# Patient Record
Sex: Female | Born: 1945
Health system: Southern US, Community
[De-identification: ages and names within clinical notes are randomized; demographics above are authoritative.]

## PROBLEM LIST (undated history)

## (undated) DIAGNOSIS — M1990 Unspecified osteoarthritis, unspecified site: Secondary | ICD-10-CM

## (undated) DIAGNOSIS — I2510 Atherosclerotic heart disease of native coronary artery without angina pectoris: Secondary | ICD-10-CM

## (undated) DIAGNOSIS — K219 Gastro-esophageal reflux disease without esophagitis: Secondary | ICD-10-CM

## (undated) DIAGNOSIS — D649 Anemia, unspecified: Secondary | ICD-10-CM

## (undated) DIAGNOSIS — R112 Nausea with vomiting, unspecified: Secondary | ICD-10-CM

## (undated) DIAGNOSIS — N189 Chronic kidney disease, unspecified: Secondary | ICD-10-CM

## (undated) DIAGNOSIS — C801 Malignant (primary) neoplasm, unspecified: Secondary | ICD-10-CM

## (undated) DIAGNOSIS — Z9889 Other specified postprocedural states: Secondary | ICD-10-CM

## (undated) DIAGNOSIS — I10 Essential (primary) hypertension: Secondary | ICD-10-CM

## (undated) DIAGNOSIS — R7303 Prediabetes: Secondary | ICD-10-CM

## (undated) DIAGNOSIS — F419 Anxiety disorder, unspecified: Secondary | ICD-10-CM

## (undated) HISTORY — DX: Unspecified osteoarthritis, unspecified site: M19.90

## (undated) HISTORY — PX: REVISION TOTAL HIP ARTHROPLASTY: SHX766

## (undated) HISTORY — DX: Anemia, unspecified: D64.9

## (undated) HISTORY — PX: ABDOMINAL HYSTERECTOMY: SHX81

## (undated) HISTORY — DX: Atherosclerotic heart disease of native coronary artery without angina pectoris: I25.10

## (undated) HISTORY — DX: Anxiety disorder, unspecified: F41.9

## (undated) HISTORY — DX: Gastro-esophageal reflux disease without esophagitis: K21.9

## (undated) HISTORY — PX: BACK SURGERY: SHX140

## (undated) HISTORY — PX: FOOT SURGERY: SHX648

---

## 1997-12-29 ENCOUNTER — Ambulatory Visit (HOSPITAL_COMMUNITY): Admission: RE | Admit: 1997-12-29 | Discharge: 1997-12-29 | Payer: Self-pay | Admitting: Neurosurgery

## 1997-12-29 ENCOUNTER — Encounter: Payer: Self-pay | Admitting: Neurosurgery

## 1998-01-15 ENCOUNTER — Ambulatory Visit (HOSPITAL_COMMUNITY): Admission: RE | Admit: 1998-01-15 | Discharge: 1998-01-15 | Payer: Self-pay | Admitting: Neurosurgery

## 1998-01-15 ENCOUNTER — Encounter: Payer: Self-pay | Admitting: Neurosurgery

## 1998-02-04 ENCOUNTER — Encounter: Payer: Self-pay | Admitting: Neurosurgery

## 1998-02-04 ENCOUNTER — Ambulatory Visit (HOSPITAL_COMMUNITY): Admission: RE | Admit: 1998-02-04 | Discharge: 1998-02-04 | Payer: Self-pay | Admitting: Neurosurgery

## 1998-02-18 ENCOUNTER — Ambulatory Visit (HOSPITAL_COMMUNITY): Admission: RE | Admit: 1998-02-18 | Discharge: 1998-02-18 | Payer: Self-pay | Admitting: Neurosurgery

## 1998-02-18 ENCOUNTER — Encounter: Payer: Self-pay | Admitting: Neurosurgery

## 2000-04-28 ENCOUNTER — Ambulatory Visit (HOSPITAL_COMMUNITY): Admission: RE | Admit: 2000-04-28 | Discharge: 2000-04-28 | Payer: Self-pay | Admitting: Internal Medicine

## 2000-04-28 ENCOUNTER — Encounter: Payer: Self-pay | Admitting: Internal Medicine

## 2001-02-15 ENCOUNTER — Encounter (INDEPENDENT_AMBULATORY_CARE_PROVIDER_SITE_OTHER): Payer: Self-pay | Admitting: Internal Medicine

## 2001-02-15 ENCOUNTER — Ambulatory Visit (HOSPITAL_COMMUNITY): Admission: RE | Admit: 2001-02-15 | Discharge: 2001-02-15 | Payer: Self-pay | Admitting: Internal Medicine

## 2001-03-09 ENCOUNTER — Ambulatory Visit (HOSPITAL_COMMUNITY): Admission: RE | Admit: 2001-03-09 | Discharge: 2001-03-09 | Payer: Self-pay | Admitting: Internal Medicine

## 2001-03-09 ENCOUNTER — Encounter (INDEPENDENT_AMBULATORY_CARE_PROVIDER_SITE_OTHER): Payer: Self-pay | Admitting: Internal Medicine

## 2001-06-30 ENCOUNTER — Ambulatory Visit (HOSPITAL_COMMUNITY): Admission: RE | Admit: 2001-06-30 | Discharge: 2001-06-30 | Payer: Self-pay | Admitting: Cardiology

## 2001-10-21 ENCOUNTER — Ambulatory Visit (HOSPITAL_COMMUNITY): Admission: RE | Admit: 2001-10-21 | Discharge: 2001-10-21 | Payer: Self-pay | Admitting: Internal Medicine

## 2001-10-21 ENCOUNTER — Encounter: Payer: Self-pay | Admitting: Internal Medicine

## 2001-11-10 ENCOUNTER — Ambulatory Visit (HOSPITAL_COMMUNITY): Admission: RE | Admit: 2001-11-10 | Discharge: 2001-11-10 | Payer: Self-pay | Admitting: Internal Medicine

## 2001-11-10 ENCOUNTER — Encounter (INDEPENDENT_AMBULATORY_CARE_PROVIDER_SITE_OTHER): Payer: Self-pay | Admitting: Internal Medicine

## 2001-12-23 ENCOUNTER — Ambulatory Visit (HOSPITAL_COMMUNITY): Admission: RE | Admit: 2001-12-23 | Discharge: 2001-12-23 | Payer: Self-pay | Admitting: Internal Medicine

## 2002-06-28 ENCOUNTER — Ambulatory Visit (HOSPITAL_COMMUNITY): Admission: RE | Admit: 2002-06-28 | Discharge: 2002-06-28 | Payer: Self-pay | Admitting: Internal Medicine

## 2002-06-28 ENCOUNTER — Encounter (INDEPENDENT_AMBULATORY_CARE_PROVIDER_SITE_OTHER): Payer: Self-pay | Admitting: Internal Medicine

## 2002-11-10 ENCOUNTER — Ambulatory Visit (HOSPITAL_COMMUNITY): Admission: RE | Admit: 2002-11-10 | Discharge: 2002-11-10 | Payer: Self-pay | Admitting: Internal Medicine

## 2004-04-04 ENCOUNTER — Ambulatory Visit (HOSPITAL_COMMUNITY): Admission: RE | Admit: 2004-04-04 | Discharge: 2004-04-04 | Payer: Self-pay | Admitting: Internal Medicine

## 2004-04-16 ENCOUNTER — Ambulatory Visit (HOSPITAL_COMMUNITY): Admission: RE | Admit: 2004-04-16 | Discharge: 2004-04-16 | Payer: Self-pay | Admitting: Internal Medicine

## 2005-04-28 ENCOUNTER — Ambulatory Visit (HOSPITAL_COMMUNITY): Admission: RE | Admit: 2005-04-28 | Discharge: 2005-04-28 | Payer: Self-pay | Admitting: Internal Medicine

## 2006-02-25 ENCOUNTER — Emergency Department (HOSPITAL_COMMUNITY): Admission: EM | Admit: 2006-02-25 | Discharge: 2006-02-26 | Payer: Self-pay | Admitting: Emergency Medicine

## 2006-04-30 ENCOUNTER — Ambulatory Visit (HOSPITAL_COMMUNITY): Admission: RE | Admit: 2006-04-30 | Discharge: 2006-04-30 | Payer: Self-pay | Admitting: Internal Medicine

## 2006-05-05 ENCOUNTER — Encounter: Admission: RE | Admit: 2006-05-05 | Discharge: 2006-05-05 | Payer: Self-pay | Admitting: Endocrinology

## 2007-05-06 ENCOUNTER — Ambulatory Visit (HOSPITAL_COMMUNITY): Admission: RE | Admit: 2007-05-06 | Discharge: 2007-05-06 | Payer: Self-pay | Admitting: Internal Medicine

## 2008-05-14 ENCOUNTER — Ambulatory Visit (HOSPITAL_COMMUNITY): Admission: RE | Admit: 2008-05-14 | Discharge: 2008-05-14 | Payer: Self-pay | Admitting: Internal Medicine

## 2009-05-16 ENCOUNTER — Ambulatory Visit (HOSPITAL_COMMUNITY): Admission: RE | Admit: 2009-05-16 | Discharge: 2009-05-16 | Payer: Self-pay | Admitting: Internal Medicine

## 2009-11-04 ENCOUNTER — Emergency Department (HOSPITAL_COMMUNITY): Admission: EM | Admit: 2009-11-04 | Discharge: 2009-11-04 | Payer: Self-pay | Admitting: Emergency Medicine

## 2009-11-05 ENCOUNTER — Emergency Department (HOSPITAL_COMMUNITY): Admission: EM | Admit: 2009-11-05 | Discharge: 2009-11-05 | Payer: Self-pay | Admitting: Emergency Medicine

## 2010-02-01 ENCOUNTER — Encounter: Payer: Self-pay | Admitting: Internal Medicine

## 2010-03-13 ENCOUNTER — Ambulatory Visit (INDEPENDENT_AMBULATORY_CARE_PROVIDER_SITE_OTHER): Payer: BC Managed Care – PPO | Admitting: Internal Medicine

## 2010-03-13 DIAGNOSIS — R1310 Dysphagia, unspecified: Secondary | ICD-10-CM

## 2010-03-26 LAB — DIFFERENTIAL
Basophils Absolute: 0 10*3/uL (ref 0.0–0.1)
Basophils Relative: 0 % (ref 0–1)
Eosinophils Absolute: 0.1 10*3/uL (ref 0.0–0.7)
Eosinophils Relative: 1 % (ref 0–5)
Lymphocytes Relative: 17 % (ref 12–46)
Lymphs Abs: 1.1 10*3/uL (ref 0.7–4.0)
Monocytes Absolute: 0.6 10*3/uL (ref 0.1–1.0)
Monocytes Relative: 8 % (ref 3–12)
Neutro Abs: 5 10*3/uL (ref 1.7–7.7)
Neutrophils Relative %: 74 % (ref 43–77)

## 2010-03-26 LAB — CBC
HCT: 40.9 % (ref 36.0–46.0)
Hemoglobin: 13.8 g/dL (ref 12.0–15.0)
MCH: 29.2 pg (ref 26.0–34.0)
MCHC: 33.7 g/dL (ref 30.0–36.0)
MCV: 86.5 fL (ref 78.0–100.0)
Platelets: 275 10*3/uL (ref 150–400)
RBC: 4.73 MIL/uL (ref 3.87–5.11)
RDW: 12.8 % (ref 11.5–15.5)
WBC: 6.8 10*3/uL (ref 4.0–10.5)

## 2010-03-26 LAB — POCT I-STAT, CHEM 8
BUN: 12 mg/dL (ref 6–23)
Calcium, Ion: 1.25 mmol/L (ref 1.12–1.32)
Chloride: 101 mEq/L (ref 96–112)
Creatinine, Ser: 1.1 mg/dL (ref 0.4–1.2)
Glucose, Bld: 99 mg/dL (ref 70–99)
HCT: 43 % (ref 36.0–46.0)
Hemoglobin: 14.6 g/dL (ref 12.0–15.0)
Potassium: 4 mEq/L (ref 3.5–5.1)
Sodium: 139 mEq/L (ref 135–145)
TCO2: 31 mmol/L (ref 0–100)

## 2010-03-26 LAB — POCT CARDIAC MARKERS
CKMB, poc: <1 ng/mL — ABNORMAL LOW (ref 1.0–8.0)
Myoglobin, poc: 69.7 ng/mL (ref 12–200)
Troponin i, poc: <0.05 ng/mL (ref 0.00–0.09)

## 2010-04-02 ENCOUNTER — Encounter (HOSPITAL_BASED_OUTPATIENT_CLINIC_OR_DEPARTMENT_OTHER): Payer: BC Managed Care – PPO | Admitting: Internal Medicine

## 2010-04-02 ENCOUNTER — Ambulatory Visit (HOSPITAL_COMMUNITY)
Admission: RE | Admit: 2010-04-02 | Discharge: 2010-04-02 | Disposition: A | Discharge status: Home / Self Care | Payer: BC Managed Care – PPO | Source: Ambulatory Visit | Attending: Internal Medicine | Admitting: Internal Medicine

## 2010-04-02 DIAGNOSIS — R1310 Dysphagia, unspecified: Secondary | ICD-10-CM

## 2010-04-02 DIAGNOSIS — E785 Hyperlipidemia, unspecified: Secondary | ICD-10-CM | POA: Insufficient documentation

## 2010-04-02 DIAGNOSIS — K449 Diaphragmatic hernia without obstruction or gangrene: Secondary | ICD-10-CM | POA: Insufficient documentation

## 2010-04-02 DIAGNOSIS — K219 Gastro-esophageal reflux disease without esophagitis: Secondary | ICD-10-CM

## 2010-04-02 DIAGNOSIS — Z7982 Long term (current) use of aspirin: Secondary | ICD-10-CM | POA: Insufficient documentation

## 2010-04-02 DIAGNOSIS — K222 Esophageal obstruction: Secondary | ICD-10-CM

## 2010-04-15 NOTE — Consult Note (Signed)
Laurie Bridges, Laurie Bridges           ACCOUNT NO.:  [id number]  MEDICAL RECORD NO.:  [id number]           PATIENT TYPE:  LOCATION: Office                              FACILITY:  PHYSICIAN:  Laurie Bridges, M.D.    DATE OF BIRTH:  09/10/1945  DATE OF CONSULTATION: 03/13/2010 DATE OF DISCHARGE:                                CONSULTATION   REASON FOR CONSULTATION:  This is an office visit for dysphagia.  HISTORY OF PRESENT ILLNESS:  Laurie Bridges is a 65 year old female referred to our office by Dr. Ouida Bridges for dysphagia.  Laurie Bridges states she is having frequent acid reflux.  She is also complaining of having spasms in her esophagus.  She states foods feel like they are lodging in her upper esophagus and are slowly going down and lodging at the end of her esophagus.  She states she has had these symptoms for about 3 months. They are somewhat better since being on the Zegerid for 6 months.  She also complains of her throat being dry.  She did undergo an EGD/ED  in 1998 for dysphagia.  I will try to locate that record.  She does state her acid reflux is somewhat better.  At night, she states foods feel like they are bubbling back up in her esophagus.  Her appetite has remained good.  She has had no weight loss.  There has been no nausea, vomiting.  She usually has a bowel movement 1 every couple of days.  She denies melena or rectal bleeding.  MEDICAL HISTORY:  Includes high cholesterol, back surgery x2.  She has had a partial hysterectomy, right bunionectomy, hyperthyroidism.  She did undergo colonoscopy in July 2011 for heme-positive stools which revealed external hemorrhoids, otherwise normal colonoscopy.  FAMILY HISTORY:  Her mother deceased from a CVA.  Her father has coronary artery disease.  One brother in good health.  SOCIAL HISTORY:  She is widowed.  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  She has 2 children in good health.  She does state one has early  COPD and there is no family history of colon cancer.  ALLERGIES:  She is allergic to [facility], PYRIDIUM, DEMEROL, and CODEINE.  MEDICATIONS: 1. Amitriptyline 50 mg a day. 2. Zegerid OTC 1 a day. 3. Lipitor 20 mg a day. 4. Reglan 5 mg a.c. and at bedtime. 5. Atenolol 25 mg a day. 6. Aspirin 81 mg a day. 7. Caltrate 600 plus D 1 a day. 8. Vivelle 0.05 one twice a day.  OBJECTIVE:  VITAL SIGNS:  Her weight is 155, her height is 5 feet 7 inches, her BMI is 25, her blood pressure is 108/76, her temp is 97.6, her pulse is 66. HEENT:  She has natural teeth.  Her oral mucosa is moist.  Her conjunctivae pink.  Her sclerae anicteric. NECK:  Her thyroid is normal.  There is no cervical lymphadenopathy. LUNGS:  Clear. HEART:  Regular rate and rhythm. ABDOMEN:  Soft.  Bowel sounds are positive.  No masses. EXTREMITIES:  There is no edema to her extremities.  ASSESSMENT:  Laurie Bridges is a 65 year old female presenting today with solid food dysphagia.  An esophageal stricture needs to be ruled out. She has had these symptoms for 3 months.  She does state they are somewhat better but she continues to have problems.  RECOMMENDATIONS:  We will schedule an EGD/ED with Dr. Karilyn Bridges in the near future.  She will continue her Zegerid.  Thank you for allowing [place] to participate in her care.    ______________________________ Laurie Ar, NP   ______________________________ Laurie Bridges, M.D.    TS/MEDQ  D:  03/13/2010  T:  03/14/2010  Job:  [id number]  cc:   Laurie Bridges. Laurie Sills, MD Fax: [phone]  Electronically Signed by Laurie Ar PA on 04/04/2010 11:44:53 AM Electronically Signed by Laurie Bridges M.D. on 04/15/2010 12:50:24 PM

## 2010-04-15 NOTE — Op Note (Signed)
  [name], [name]           ACCOUNT NO.:  [id number]  MEDICAL RECORD NO.:  [id number]           PATIENT TYPE:  O  LOCATION:  DAYP                          FACILITY:  [facility]  PHYSICIAN:  Lionel December, M.D.    DATE OF BIRTH:  [birth year]  DATE OF PROCEDURE:  04/02/2010 DATE OF DISCHARGE:                               PROCEDURE NOTE   PROCEDURE:  Esophagogastroduodenoscopy with esophageal dilation.  INDICATION:  Laurie Bridges is a 65 year old Caucasian female, who presents with intermittent solid food dysphagia.  She has chronic GERD, presently maintained on antireflux measures and Zegerid OTC.  Her esophagus was last dilated in 1998.  Procedure and risks were reviewed with the patient.  Informed consent was obtained.  MEDICATIONS FOR CONSCIOUS SEDATION:  Cetacaine spray for pharyngeal topical anesthesia, Demerol 50 mg IV, Versed 4 mg IV.  FINDINGS:  Procedure performed in endoscopy suite.  The patient's vital signs and O2 sat were monitored during the procedure and remained stable.  The patient was placed in left lateral recumbent position and Pentax videoscope was passed through oropharynx without any difficulty into esophagus.  Esophagus, mucosa of the esophagus normal.  GE junction was unremarkable with a 2-cm sized sliding hiatal hernia.  Stomach was empty and distended very well when insufflation.  Folds of proximal stomach were normal.  Examination of mucosa at body, antrum, pyloric channel as well as angularis, fundus, and cardia was normal.  Duodenum, bulbar mucosa was normal.  Scope was passed to second part of the duodenal mucosa and folds were normal.  Endoscope was withdrawn.  Esophagus was dilated by passing 54-French Maloney dilator to full insertion.  Even though, no resistance was noted as the dilator was passed to full insertion.  Post dilation, review of esophageal mucosa revealed 0.2 cm long linear tear just proximal to GE junction.  Pictures could not  be taken because of malfunction, but this finding was observed by the endo staff.  Endoscope was withdrawn.  The patient tolerated the procedure well.  FINAL DIAGNOSES:  Small sliding hiatal hernia.  No evidence of obvious stricture, ring, or web.  Esophageal dilation resulted in 2-cm long linear mucosal disruption, distal esophagus, proximal to GE junction indicative of subtle luminal narrowing.  RECOMMENDATIONS:  She will continue antireflux measures and Zegerid OTC 20 mg p.o. q.a.m.  She will call us with a progress report in 1 week. She will return for OV in 2 months.     Lionel December, M.D.     NR/MEDQ  D:  04/02/2010  T:  04/02/2010  Job:  [id number]  cc:   Kingsley Callander. Ouida Sills, MD Fax: [phone]  Electronically Signed by Lionel December M.D. on 04/15/2010 12:51:03 PM

## 2010-04-30 ENCOUNTER — Other Ambulatory Visit (HOSPITAL_COMMUNITY): Payer: Self-pay | Admitting: Internal Medicine

## 2010-04-30 DIAGNOSIS — Z139 Encounter for screening, unspecified: Secondary | ICD-10-CM

## 2010-05-19 ENCOUNTER — Ambulatory Visit (HOSPITAL_COMMUNITY)
Admission: RE | Admit: 2010-05-19 | Discharge: 2010-05-19 | Disposition: A | Discharge status: Home / Self Care | Payer: BC Managed Care – PPO | Source: Ambulatory Visit | Attending: Internal Medicine | Admitting: Internal Medicine

## 2010-05-19 DIAGNOSIS — Z1231 Encounter for screening mammogram for malignant neoplasm of breast: Secondary | ICD-10-CM | POA: Insufficient documentation

## 2010-05-19 DIAGNOSIS — Z139 Encounter for screening, unspecified: Secondary | ICD-10-CM

## 2010-05-20 ENCOUNTER — Other Ambulatory Visit (INDEPENDENT_AMBULATORY_CARE_PROVIDER_SITE_OTHER): Payer: Self-pay | Admitting: Internal Medicine

## 2010-05-20 ENCOUNTER — Ambulatory Visit (INDEPENDENT_AMBULATORY_CARE_PROVIDER_SITE_OTHER): Payer: BC Managed Care – PPO | Admitting: Internal Medicine

## 2010-05-20 DIAGNOSIS — K219 Gastro-esophageal reflux disease without esophagitis: Secondary | ICD-10-CM

## 2010-05-27 ENCOUNTER — Encounter (HOSPITAL_COMMUNITY): Payer: Self-pay

## 2010-05-27 ENCOUNTER — Encounter (HOSPITAL_COMMUNITY)
Admission: RE | Admit: 2010-05-27 | Discharge: 2010-05-27 | Disposition: A | Discharge status: Home / Self Care | Payer: BC Managed Care – PPO | Source: Ambulatory Visit | Attending: Internal Medicine | Admitting: Internal Medicine

## 2010-05-27 DIAGNOSIS — R6881 Early satiety: Secondary | ICD-10-CM | POA: Insufficient documentation

## 2010-05-27 DIAGNOSIS — R143 Flatulence: Secondary | ICD-10-CM | POA: Insufficient documentation

## 2010-05-27 DIAGNOSIS — R141 Gas pain: Secondary | ICD-10-CM | POA: Insufficient documentation

## 2010-05-27 DIAGNOSIS — R142 Eructation: Secondary | ICD-10-CM | POA: Insufficient documentation

## 2010-05-27 DIAGNOSIS — K219 Gastro-esophageal reflux disease without esophagitis: Secondary | ICD-10-CM | POA: Insufficient documentation

## 2010-05-27 HISTORY — DX: Essential (primary) hypertension: I10

## 2010-05-27 MED ORDER — TECHNETIUM TC 99M SULFUR COLLOID
2.0000 | Freq: Once | INTRAVENOUS | Status: AC | PRN
  Administered 2010-05-27: 1.8 via ORAL

## 2010-05-30 NOTE — Op Note (Signed)
   NAME:  Laurie Bridges, Laurie Bridges                     ACCOUNT NO.:  [id number]   MEDICAL RECORD NO.:  [id number]                   PATIENT TYPE:  AMB   LOCATION:  DAY                                  FACILITY:  [facility]   PHYSICIAN:  Lionel December, M.D.                 DATE OF BIRTH:  1945-01-31   DATE OF PROCEDURE:  12/23/2001  DATE OF DISCHARGE:                                 OPERATIVE REPORT   PROCEDURE:  Total colonoscopy.   INDICATIONS FOR PROCEDURE:  Laurie Bridges is a 65 year old Caucasian female  whose undergoing screening colonoscopy.  Family history is negative for CRC.  The procedure was reviewed with the patient and informed consent was  obtained.   PREOP MEDICATIONS:  Demerol 25 mg IV, Versed 4 mg IV in divided dose.   INSTRUMENT:  Olympus video system.   FINDINGS:  The procedure performed in endoscopy suite. The patient's vital  signs and O2 SATs were monitored during the procedure and remained stable.  The patient was placed in the left lateral decubitus position and a rectal  examination performed. No abnormality noted on external or digital exam. The  scope was placed in the rectum and advanced under direct vision in the  sigmoid colon and beyond. Redundant colon. Preparation was excellent. The  scope was passed to the cecum which was identified by the ileocecal valve  and appendiceal orifice. Pictures taken for the record. After the scope was  withdrawn, the colonic mucosa was carefully examined. There was a small  diverticulum in the distal transverse colon. The mucosa in the rest of the  colon was normal. Rectal mucosa similarly was normal. The scope was  retroflexed to examine the anorectal junction and moderate size hemorrhoids  were noted at the __________canal. The endoscope was straightened and  withdrawn. The patient tolerated the procedure well.   FINAL DIAGNOSES:  1. Examination performed to the cecum.  2. Redundant colon with single small diverticula in mid  transverse colon and     moderate external hemorrhoids.   RECOMMENDATIONS:  1. She will continue yearly Hemoccults.  2.     Citrucel one tablespoonful daily.  3. Lactulose 2-3 tablespoonful q.h.s. p.r.n. for constipation, prescription     given for one quart with multiple refills for up to a year.                                               Lionel December, M.D.    NR/MEDQ  D:  12/23/2001  T:  12/24/2001  Job:  [id number]   cc:   Kingsley Callander. Ouida Sills, M.D.  [address]  [place]  [place] [address]  Fax: [phone]

## 2011-01-21 DIAGNOSIS — L600 Ingrowing nail: Secondary | ICD-10-CM | POA: Diagnosis not present

## 2011-01-22 DIAGNOSIS — J329 Chronic sinusitis, unspecified: Secondary | ICD-10-CM | POA: Diagnosis not present

## 2011-01-29 DIAGNOSIS — H2510 Age-related nuclear cataract, unspecified eye: Secondary | ICD-10-CM | POA: Diagnosis not present

## 2011-03-30 DIAGNOSIS — M216X9 Other acquired deformities of unspecified foot: Secondary | ICD-10-CM | POA: Diagnosis not present

## 2011-03-30 DIAGNOSIS — L600 Ingrowing nail: Secondary | ICD-10-CM | POA: Diagnosis not present

## 2011-05-18 ENCOUNTER — Other Ambulatory Visit (HOSPITAL_COMMUNITY): Payer: Self-pay | Admitting: Internal Medicine

## 2011-05-18 DIAGNOSIS — Z139 Encounter for screening, unspecified: Secondary | ICD-10-CM

## 2011-05-22 ENCOUNTER — Ambulatory Visit (HOSPITAL_COMMUNITY)
Admission: RE | Admit: 2011-05-22 | Discharge: 2011-05-22 | Disposition: A | Discharge status: Home / Self Care | Payer: Medicare Other | Source: Ambulatory Visit | Attending: Internal Medicine | Admitting: Internal Medicine

## 2011-05-22 DIAGNOSIS — Z1231 Encounter for screening mammogram for malignant neoplasm of breast: Secondary | ICD-10-CM | POA: Insufficient documentation

## 2011-05-22 DIAGNOSIS — Z139 Encounter for screening, unspecified: Secondary | ICD-10-CM

## 2011-05-27 ENCOUNTER — Telehealth (INDEPENDENT_AMBULATORY_CARE_PROVIDER_SITE_OTHER): Payer: Self-pay | Admitting: *Deleted

## 2011-05-27 NOTE — Telephone Encounter (Signed)
Prescription called to [facility]

## 2011-05-27 NOTE — Telephone Encounter (Signed)
Can refill pantoprazole 40 mg qd 30 with 11 refills.

## 2011-05-27 NOTE — Telephone Encounter (Signed)
[facility] has requested a refill on Pantoprazole 40 mg tablet, take one by mouth each morning.

## 2011-06-12 DIAGNOSIS — E785 Hyperlipidemia, unspecified: Secondary | ICD-10-CM | POA: Diagnosis not present

## 2011-06-12 DIAGNOSIS — I10 Essential (primary) hypertension: Secondary | ICD-10-CM | POA: Diagnosis not present

## 2011-06-12 DIAGNOSIS — E0590 Thyrotoxicosis, unspecified without thyrotoxic crisis or storm: Secondary | ICD-10-CM | POA: Diagnosis not present

## 2011-06-12 DIAGNOSIS — Z79899 Other long term (current) drug therapy: Secondary | ICD-10-CM | POA: Diagnosis not present

## 2011-06-19 DIAGNOSIS — K219 Gastro-esophageal reflux disease without esophagitis: Secondary | ICD-10-CM | POA: Diagnosis not present

## 2011-06-19 DIAGNOSIS — I498 Other specified cardiac arrhythmias: Secondary | ICD-10-CM | POA: Diagnosis not present

## 2011-06-19 DIAGNOSIS — I10 Essential (primary) hypertension: Secondary | ICD-10-CM | POA: Diagnosis not present

## 2011-06-19 DIAGNOSIS — Z23 Encounter for immunization: Secondary | ICD-10-CM | POA: Diagnosis not present

## 2011-06-19 DIAGNOSIS — Z1212 Encounter for screening for malignant neoplasm of rectum: Secondary | ICD-10-CM | POA: Diagnosis not present

## 2011-06-19 DIAGNOSIS — E785 Hyperlipidemia, unspecified: Secondary | ICD-10-CM | POA: Diagnosis not present

## 2011-10-12 DIAGNOSIS — R5381 Other malaise: Secondary | ICD-10-CM | POA: Diagnosis not present

## 2011-10-12 DIAGNOSIS — R5383 Other fatigue: Secondary | ICD-10-CM | POA: Diagnosis not present

## 2011-11-04 DIAGNOSIS — R5381 Other malaise: Secondary | ICD-10-CM | POA: Diagnosis not present

## 2011-11-04 DIAGNOSIS — Z23 Encounter for immunization: Secondary | ICD-10-CM | POA: Diagnosis not present

## 2011-11-11 DIAGNOSIS — J329 Chronic sinusitis, unspecified: Secondary | ICD-10-CM | POA: Diagnosis not present

## 2011-12-21 DIAGNOSIS — R5381 Other malaise: Secondary | ICD-10-CM | POA: Diagnosis not present

## 2011-12-21 DIAGNOSIS — R5383 Other fatigue: Secondary | ICD-10-CM | POA: Diagnosis not present

## 2012-05-23 ENCOUNTER — Other Ambulatory Visit (HOSPITAL_COMMUNITY): Payer: Self-pay | Admitting: Internal Medicine

## 2012-05-23 DIAGNOSIS — Z139 Encounter for screening, unspecified: Secondary | ICD-10-CM

## 2012-05-26 ENCOUNTER — Ambulatory Visit (HOSPITAL_COMMUNITY)
Admission: RE | Admit: 2012-05-26 | Discharge: 2012-05-26 | Disposition: A | Discharge status: Home / Self Care | Payer: Medicare Other | Source: Ambulatory Visit | Attending: Internal Medicine | Admitting: Internal Medicine

## 2012-05-26 DIAGNOSIS — Z139 Encounter for screening, unspecified: Secondary | ICD-10-CM

## 2012-05-26 DIAGNOSIS — Z1231 Encounter for screening mammogram for malignant neoplasm of breast: Secondary | ICD-10-CM | POA: Insufficient documentation

## 2012-06-17 DIAGNOSIS — Z79899 Other long term (current) drug therapy: Secondary | ICD-10-CM | POA: Diagnosis not present

## 2012-06-17 DIAGNOSIS — K2100 Gastro-esophageal reflux disease with esophagitis, without bleeding: Secondary | ICD-10-CM | POA: Diagnosis not present

## 2012-06-17 DIAGNOSIS — I10 Essential (primary) hypertension: Secondary | ICD-10-CM | POA: Diagnosis not present

## 2012-06-17 DIAGNOSIS — E785 Hyperlipidemia, unspecified: Secondary | ICD-10-CM | POA: Diagnosis not present

## 2012-07-04 DIAGNOSIS — Z1212 Encounter for screening for malignant neoplasm of rectum: Secondary | ICD-10-CM | POA: Diagnosis not present

## 2012-07-04 DIAGNOSIS — D6189 Other specified aplastic anemias and other bone marrow failure syndromes: Secondary | ICD-10-CM | POA: Diagnosis not present

## 2012-07-04 DIAGNOSIS — E785 Hyperlipidemia, unspecified: Secondary | ICD-10-CM | POA: Diagnosis not present

## 2012-07-04 DIAGNOSIS — K219 Gastro-esophageal reflux disease without esophagitis: Secondary | ICD-10-CM | POA: Diagnosis not present

## 2012-07-27 ENCOUNTER — Encounter (HOSPITAL_COMMUNITY): Payer: Medicare Other | Attending: Hematology and Oncology

## 2012-07-27 ENCOUNTER — Encounter (HOSPITAL_COMMUNITY): Payer: Self-pay

## 2012-07-27 VITALS — BP 128/84 | HR 93 | Temp 96.8°F | Resp 20 | Wt 158.1 lb

## 2012-07-27 DIAGNOSIS — D649 Anemia, unspecified: Secondary | ICD-10-CM | POA: Diagnosis not present

## 2012-07-27 DIAGNOSIS — D61818 Other pancytopenia: Secondary | ICD-10-CM | POA: Insufficient documentation

## 2012-07-27 DIAGNOSIS — D72819 Decreased white blood cell count, unspecified: Secondary | ICD-10-CM

## 2012-07-27 DIAGNOSIS — D696 Thrombocytopenia, unspecified: Secondary | ICD-10-CM

## 2012-07-27 LAB — COMPREHENSIVE METABOLIC PANEL
ALT: 23 U/L (ref 0–35)
AST: 24 U/L (ref 0–37)
Albumin: 3.7 g/dL (ref 3.5–5.2)
Alkaline Phosphatase: 83 U/L (ref 39–117)
BUN: 13 mg/dL (ref 6–23)
CO2: 26 mEq/L (ref 19–32)
Calcium: 9.8 mg/dL (ref 8.4–10.5)
Chloride: 106 mEq/L (ref 96–112)
Creatinine, Ser: 0.9 mg/dL (ref 0.50–1.10)
GFR calc Af Amer: 76 mL/min — ABNORMAL LOW (ref >90)
GFR calc non Af Amer: 65 mL/min — ABNORMAL LOW (ref >90)
Glucose, Bld: 114 mg/dL — ABNORMAL HIGH (ref 70–99)
Potassium: 4.2 mEq/L (ref 3.5–5.1)
Sodium: 139 mEq/L (ref 135–145)
Total Bilirubin: 0.3 mg/dL (ref 0.3–1.2)
Total Protein: 7.2 g/dL (ref 6.0–8.3)

## 2012-07-27 LAB — CBC WITH DIFFERENTIAL/PLATELET
Basophils Absolute: 0 10*3/uL (ref 0.0–0.1)
Basophils Relative: 1 % (ref 0–1)
Eosinophils Absolute: 0.1 10*3/uL (ref 0.0–0.7)
Eosinophils Relative: 3 % (ref 0–5)
HCT: 40.6 % (ref 36.0–46.0)
Hemoglobin: 13.8 g/dL (ref 12.0–15.0)
Lymphocytes Relative: 26 % (ref 12–46)
Lymphs Abs: 1.4 10*3/uL (ref 0.7–4.0)
MCH: 29.3 pg (ref 26.0–34.0)
MCHC: 34 g/dL (ref 30.0–36.0)
MCV: 86.2 fL (ref 78.0–100.0)
Monocytes Absolute: 0.5 10*3/uL (ref 0.1–1.0)
Monocytes Relative: 10 % (ref 3–12)
Neutro Abs: 3.2 10*3/uL (ref 1.7–7.7)
Neutrophils Relative %: 61 % (ref 43–77)
Platelets: 278 10*3/uL (ref 150–400)
RBC: 4.71 MIL/uL (ref 3.87–5.11)
RDW: 12.7 % (ref 11.5–15.5)
WBC: 5.3 10*3/uL (ref 4.0–10.5)

## 2012-07-27 LAB — LACTATE DEHYDROGENASE: LDH: 223 U/L (ref 94–250)

## 2012-07-27 NOTE — Progress Notes (Signed)
Laurie Bridges presented for labwork. Labs per MD order drawn via Peripheral Line 23 gauge needle inserted in left hand.  Good blood return present. Procedure without incident.  Needle removed intact. Patient tolerated procedure well.

## 2012-07-27 NOTE — Patient Instructions (Addendum)
[facility] Discharge Instructions  RECOMMENDATIONS MADE BY THE CONSULTANT AND ANY TEST RESULTS WILL BE SENT TO YOUR REFERRING PHYSICIAN.  The doctor is not concerned that you have anything bad going on. We will do some blood work today and have you come back in 2 weeks to discuss results.  Thank you for choosing [facility] to provide your oncology and hematology care.  To afford each patient quality time with our providers, please arrive at least 15 minutes before your scheduled appointment time.  With your help, our goal is to use those 15 minutes to complete the necessary work-up to ensure our physicians have the information they need to help with your evaluation and healthcare recommendations.    Effective January 1st, 2014, we ask that you re-schedule your appointment with our physicians should you arrive 10 or more minutes late for your appointment.  We strive to give you quality time with our providers, and arriving late affects you and other patients whose appointments are after yours.    Again, thank you for choosing [facility].  Our hope is that these requests will decrease the amount of time that you wait before being seen by our physicians.       _____________________________________________________________  Should you have questions after your visit to [facility], please contact our office at [phone] between the hours of 8:30 a.m. and 5:00 p.m.  Voicemails left after 4:30 p.m. will not be returned until the following business day.  For prescription refill requests, have your pharmacy contact our office with your prescription refill request.

## 2012-07-27 NOTE — Progress Notes (Addendum)
Patient History and Physical   Laurie Bridges [id number] [birth year] 67 y.o. 07/27/2012  Referring MD: Laurie Perches.MD.  Chief Complaint:Pancytopenia  HPI:  Laurie Bridges is a 67 year old woman referred by Laurie Bridges for concern of pancytopenia.I have reviewed records made available by Laurie Bridges office. I have also reviewed her historical CBCs in the [facility] electronic health record.  Patient's CBC has been normal  Since 2011 until June of this year when patient had an isolated mild drop in the 3 cell lines. Precisely this was done on 06/27/2012 and showed WBC of 3.1K, hemoglobin of 11.4 g per dL and platelet count of 865,784. She states that she did not have any specific infection at that time the CBC was done.  There was no WBC differential done with this.  Patient feels well and was accompanied by her sister-in-law Laurie Bridges. She is eating well denies any significant night sweats lymphadenopathy, easy bruising or bleeding, fatigue, recent infection or change in bowel habits.  She tells me that during her premenopausal years she took iron for heavy menstrual bleeding and ultimately had a hysterectomy otherwise has not had any blood disease or condition.   PMH: Past Medical History  Diagnosis Date  . Hypertension   . Anemia     hx of Fe def many years ago  . Arthritis     back  . GERD (gastroesophageal reflux disease)   . Anxiety     Past Surgical History  Procedure Laterality Date  . Abdominal hysterectomy      partial hysterectomy, ovaries remain  . Back surgery      Allergies: Allergies  Allergen Reactions  . Codeine   . Demerol   . Feldene (Piroxicam)   . Latex   . Pyridium (Phenazopyridine Hcl)   . Sulfur     Medications: Current outpatient prescriptions:atorvastatin (LIPITOR) 20 MG tablet, Take 20 mg by mouth daily., Disp: , Rfl: ;  clonazePAM (KLONOPIN) 0.5 MG tablet, Take 0.5 mg by mouth 2 (two) times daily as needed for anxiety (or at bedtime)., Disp: , Rfl: ;   estradiol (VIVELLE-DOT) 0.05 MG/24HR, Place 1 patch onto the skin. Every 2 weeks, Disp: , Rfl: ;  fexofenadine (ALLEGRA) 180 MG tablet, Take 180 mg by mouth daily., Disp: , Rfl:  naproxen sodium (ANAPROX) 220 MG tablet, Take 220 mg by mouth. 1-2 tablets once daily only if needed, Disp: , Rfl: ;  Omeprazole-Sodium Bicarbonate (ZEGERID) 20-1100 MG CAPS, Take 1 capsule by mouth daily before breakfast., Disp: , Rfl:    Social History:   reports that she has never smoked. She has never used smokeless tobacco. She reports that she does not drink alcohol or use illicit drugs. 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 Lives alone.Has one brother without any history of cancer of blood disease. She denies alcohol.  Family History: Family History  Problem Relation Age of Onset  . Stroke Mother   . Stroke Father   . Cancer Maternal Bridges   . Cancer Paternal Bridges   Laurie Bridges dies of leukemia. Laurie Bridges had lung cancer with brain involvement. Another paternal Bridges had throat cancer. Denies family hx  Of blood disease.  Review of Systems:14 point review of system is as in the history above otherwise negative.    Physical Exam: Blood pressure 128/84, pulse 93, temperature 96.8 F (36 C), temperature source Oral, resp. rate 20, weight 158 lb 1.6 oz (71.714 kg). GENERAL: No distress, well nourished.  SKIN:  No rashes or significant lesions .  No ecchymosis on petechia rash. HEAD: Normocephalic, No masses, lesions, tenderness or abnormalities  EYES: Conjunctiva are pink and non-injected  ENT: External ears normal ,lips, buccal mucosa, and tongue normal and mucous membranes are moist  LYMPH: No palpable lymphadenopathy, in the neck, supraclavicular or axillary areas. Also no inguinal lymphadenopathy palpable. LUNGS: clear to auscultation , no crackles or wheezes HEART: regular rate & rhythm, no murmurs, no gallops, S1 normal and S2 normal .  No S3 heard. ABDOMEN:  Abdomen soft, non-tender, normal bowel sounds, no masses or organomegaly and no hepatosplenomegaly palpable. EXTREMITIES: No edema, no skin discoloration or tenderness NEURO: alert & oriented , no focal motor/sensory deficits.     Lab Results: Lab Results  Component Value Date   WBC 6.8 11/05/2009   HGB 14.6 11/05/2009   HCT 43.0 11/05/2009   MCV 86.5 11/05/2009   PLT 275 11/05/2009     Chemistry      Component Value Date/Time   NA 139 11/05/2009 1534   K 4.0 11/05/2009 1534   CL 101 11/05/2009 1534   BUN 12 11/05/2009 1534   CREATININE 1.1 11/05/2009 1534   No results found for this basename: CALCIUM, ALKPHOS, AST, ALT, BILITOT         Radiological Studies: No results found.    Impression: Ms. Breeding has what seems to be isolated mild drop in her 3 cell lines.  This can be nonsignificant and not uncommonly  be related to transient viral or other infections. Medication is also considered but she does not provide a history of any new medication. She has no B. Symptoms and does not have any lymphadenopathy which makes me less suspicious for any significant hematologic condition or malignancy.  I think are repeating her CBC and doing a Peripheral Blood smear review is reasonable .  Recommendations: 1.  CBC CMP and review of peripheral blood smear.  2.  She'll return to clinic  in 2 weeks to discuss these results, at that time if no significant abnormalities are found, I will refer patient back to Laurie Bridges to continue her routine care.     All questions were satisfactorily answered.She knows to call if she has any concern.  I spent more than 50% of the total of 60 minutes counseling the patient face to face.    Laurie Hammers, MD FACP. Hematology/Oncology.   Addendum: Review of peripheral blood smear showed normal RBC morphology, normal segmented neutrophils, adequate platelets and no blast forms seen. OVERALL NORMAL PERIPHERAL BLOOD SMEAR.

## 2012-07-28 LAB — IRON AND TIBC
Iron: 80 ug/dL (ref 42–135)
Saturation Ratios: 25 % (ref 20–55)
TIBC: 315 ug/dL (ref 250–470)
UIBC: 235 ug/dL (ref 125–400)

## 2012-07-28 LAB — FERRITIN: Ferritin: 14 ng/mL (ref 10–291)

## 2012-07-28 LAB — FOLATE: Folate: >20 ng/mL

## 2012-07-28 LAB — VITAMIN B12: Vitamin B-12: 344 pg/mL (ref 211–911)

## 2012-08-15 ENCOUNTER — Encounter (HOSPITAL_COMMUNITY): Payer: Medicare Other | Attending: Hematology and Oncology

## 2012-08-15 ENCOUNTER — Encounter (HOSPITAL_COMMUNITY): Payer: Self-pay

## 2012-08-15 VITALS — BP 145/85 | HR 93 | Temp 97.0°F | Resp 18 | Wt 157.7 lb

## 2012-08-15 DIAGNOSIS — D61818 Other pancytopenia: Secondary | ICD-10-CM

## 2012-08-15 NOTE — Progress Notes (Signed)
Patient History and Physical   Laurie Bridges [id number] [birth year] 67 y.o. 08/15/2012  Referring MD: Carylon Perches, MD.  Chief Complaint: Pancytopenia, mild, noted on CBC of 06/27/12. Spontaneously improved on repeat CBC on 07/27/12.  Labs done on 07/27/12 -  Hemoglobin 13.8, MCV 86.2, platelets 278, WBC 5300, 61% neutrophils, 26% lymphocytes, 10% monocytes, 3% eosinophils, 1% basophils. Serum LDH, Cr, LFT, B12, folate, iron study all unremarkable.  HPI:  Patient returns for continued hematology followup, she was seen by Dr. Scarlette Slice. on July 16 for consultation for mild pancytopenia. Repeat labs done on July 16 showed the blood counts are back in the normal range. Clinically states that she is doing well without any active complaints. No fevers or night sweats. Appetite is excellent. No bleeding symptoms. No progressive fatigue or any anemia symptoms.   PMH: Past Medical History  Diagnosis Date  . Hypertension   . Anemia     hx of Fe def many years ago  . Arthritis     back  . GERD (gastroesophageal reflux disease)   . Anxiety     Past Surgical History  Procedure Laterality Date  . Abdominal hysterectomy      partial hysterectomy, ovaries remain  . Back surgery      Allergies: Allergies  Allergen Reactions  . Codeine   . Demerol   . Feldene (Piroxicam)   . Latex   . Pyridium (Phenazopyridine Hcl)   . Sulfur     Medications: Current outpatient prescriptions:atorvastatin (LIPITOR) 20 MG tablet, Take 20 mg by mouth daily., Disp: , Rfl: ;  clonazePAM (KLONOPIN) 0.5 MG tablet, Take 0.5 mg by mouth 2 (two) times daily as needed for anxiety (or at bedtime)., Disp: , Rfl: ;  estradiol (VIVELLE-DOT) 0.05 MG/24HR, Place 1 patch onto the skin. Every 2 weeks, Disp: , Rfl: ;  fexofenadine (ALLEGRA) 180 MG tablet, Take 180 mg by mouth daily., Disp: , Rfl:  naproxen sodium (ANAPROX) 220 MG tablet, Take 220 mg by mouth. 1-2 tablets once daily only if needed, Disp: , Rfl: ;   Omeprazole-Sodium Bicarbonate (ZEGERID) 20-1100 MG CAPS, Take 1 capsule by mouth daily before breakfast., Disp: , Rfl:    Social History:   reports that she has never smoked. She has never used smokeless tobacco. She reports that she does not drink alcohol or use illicit drugs. 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 Lives alone.Has one brother without any history of cancer of blood disease. She denies alcohol.  Family History: Family History  Problem Relation Age of Onset  . Stroke Mother   . Stroke Father   . Cancer Maternal Uncle   . Cancer Paternal Uncle   . Cancer Paternal Uncle   Mat Uncle died of leukemia. [name] had lung cancer with brain involvement. Another paternal uncle had throat cancer. Denies family hx  Of blood disease.  Review of Systems: As in history of present illness above. In addition, no fevers or night sweats. No new headaches or focal weakness. No sore throat or dysphagia. No new cough, sputum, hemoptysis or chest pain. No abdominal pain, diarrhea, bright red blood in stools or melena. No new paresthesias in extremities.   Physical Exam: Blood pressure 145/85, pulse 93, temperature 97 F (36.1 C), temperature source Oral, resp. rate 18, weight 157 lb 11.2 oz (71.532 kg). GENERAL: Patient is alert and oriented, in no acute distress. No icterus or pallor. HEENT : EOMs intact. No cervical adenopathy  LUNGS : Bilaterally clear to auscultation  ABDOMEN : Soft, nontender, no hepatosplenomegaly clinically  EXTREMITIES : No edema. No axillary adenopathy.   Lab Results: Lab Results  Component Value Date   WBC 5.3 07/27/2012   HGB 13.8 07/27/2012   HCT 40.6 07/27/2012   MCV 86.2 07/27/2012   PLT 278 07/27/2012     Chemistry      Component Value Date/Time   NA 139 07/27/2012 1536   K 4.2 07/27/2012 1536   CL 106 07/27/2012 1536   CO2 26 07/27/2012 1536   BUN 13 07/27/2012 1536   CREATININE 0.90 07/27/2012 1536      Component  Value Date/Time   CALCIUM 9.8 07/27/2012 1536       IMPRESSION / PLAN: Pancytopenia, mild, noted on CBC of 06/27/12. Spontaneously improved on repeat CBC of 07/27/12, other workup was also unremarkable including serum LDH, Cr, LFT, B12, folate, iron study. Patient is clinically doing well without any symptoms. Have explained to patient that the blood counts have spontaneously improved, and that mild pancytopenia noted on prior CBC could either be transient secondary to unrecognized viral infection or could be a lab artifact/error. Given the above, do not see the need for further workup or continued hematology followup, she is being discharged from our clinic. Recommend monitoring CBC/differential upon routine primary physician visits or in between if she develops any new symptoms/sickness. We would be happy to see her back in the future if she develops recurrent pancytopenia or other hematology issues. Patient explained above details, she is agreeable to this plan.   Janese Banks, MD

## 2012-08-15 NOTE — Patient Instructions (Signed)
.  [facility] Discharge Instructions  RECOMMENDATIONS MADE BY THE CONSULTANT AND ANY TEST RESULTS WILL BE SENT TO YOUR REFERRING PHYSICIAN.  EXAM FINDINGS BY THE PHYSICIAN TODAY AND SIGNS OR SYMPTOMS TO REPORT TO CLINIC OR PRIMARY PHYSICIAN:  Findings normal. Patient discharged from clinic.   SPECIAL INSTRUCTIONS/FOLLOW-UP: PRN Thank you for choosing [facility] to provide your oncology and hematology care.  To afford each patient quality time with our providers, please arrive at least 15 minutes before your scheduled appointment time.  With your help, our goal is to use those 15 minutes to complete the necessary work-up to ensure our physicians have the information they need to help with your evaluation and healthcare recommendations.    Effective January 1st, 2014, we ask that you re-schedule your appointment with our physicians should you arrive 10 or more minutes late for your appointment.  We strive to give you quality time with our providers, and arriving late affects you and other patients whose appointments are after yours.    Again, thank you for choosing [facility].  Our hope is that these requests will decrease the amount of time that you wait before being seen by our physicians.       _____________________________________________________________  Should you have questions after your visit to [facility], please contact our office at [phone] between the hours of 8:30 a.m. and 5:00 p.m.  Voicemails left after 4:30 p.m. will not be returned until the following business day.  For prescription refill requests, have your pharmacy contact our office with your prescription refill request.

## 2012-08-17 ENCOUNTER — Encounter: Payer: Self-pay | Admitting: Internal Medicine

## 2012-11-18 DIAGNOSIS — N390 Urinary tract infection, site not specified: Secondary | ICD-10-CM | POA: Diagnosis not present

## 2012-11-18 DIAGNOSIS — Z23 Encounter for immunization: Secondary | ICD-10-CM | POA: Diagnosis not present

## 2012-12-20 DIAGNOSIS — N390 Urinary tract infection, site not specified: Secondary | ICD-10-CM | POA: Diagnosis not present

## 2012-12-20 DIAGNOSIS — J069 Acute upper respiratory infection, unspecified: Secondary | ICD-10-CM | POA: Diagnosis not present

## 2013-01-27 DIAGNOSIS — M5137 Other intervertebral disc degeneration, lumbosacral region: Secondary | ICD-10-CM | POA: Diagnosis not present

## 2013-02-15 DIAGNOSIS — L570 Actinic keratosis: Secondary | ICD-10-CM | POA: Diagnosis not present

## 2013-02-15 DIAGNOSIS — D235 Other benign neoplasm of skin of trunk: Secondary | ICD-10-CM | POA: Diagnosis not present

## 2013-02-15 DIAGNOSIS — L820 Inflamed seborrheic keratosis: Secondary | ICD-10-CM | POA: Diagnosis not present

## 2013-02-15 DIAGNOSIS — Z85828 Personal history of other malignant neoplasm of skin: Secondary | ICD-10-CM | POA: Diagnosis not present

## 2013-04-15 ENCOUNTER — Emergency Department (HOSPITAL_COMMUNITY)
Admission: EM | Admit: 2013-04-15 | Discharge: 2013-04-15 | Disposition: A | Discharge status: Home / Self Care | Payer: Medicare Other | Attending: Emergency Medicine | Admitting: Emergency Medicine

## 2013-04-15 ENCOUNTER — Encounter (HOSPITAL_COMMUNITY): Payer: Self-pay | Admitting: Emergency Medicine

## 2013-04-15 ENCOUNTER — Emergency Department (HOSPITAL_COMMUNITY): Payer: Medicare Other

## 2013-04-15 DIAGNOSIS — Y9239 Other specified sports and athletic area as the place of occurrence of the external cause: Secondary | ICD-10-CM | POA: Insufficient documentation

## 2013-04-15 DIAGNOSIS — Y92838 Other recreation area as the place of occurrence of the external cause: Secondary | ICD-10-CM

## 2013-04-15 DIAGNOSIS — Z862 Personal history of diseases of the blood and blood-forming organs and certain disorders involving the immune mechanism: Secondary | ICD-10-CM | POA: Insufficient documentation

## 2013-04-15 DIAGNOSIS — S93609A Unspecified sprain of unspecified foot, initial encounter: Secondary | ICD-10-CM | POA: Insufficient documentation

## 2013-04-15 DIAGNOSIS — Z79899 Other long term (current) drug therapy: Secondary | ICD-10-CM | POA: Insufficient documentation

## 2013-04-15 DIAGNOSIS — M129 Arthropathy, unspecified: Secondary | ICD-10-CM | POA: Diagnosis not present

## 2013-04-15 DIAGNOSIS — Y9354 Activity, bowling: Secondary | ICD-10-CM | POA: Insufficient documentation

## 2013-04-15 DIAGNOSIS — S93602A Unspecified sprain of left foot, initial encounter: Secondary | ICD-10-CM

## 2013-04-15 DIAGNOSIS — I10 Essential (primary) hypertension: Secondary | ICD-10-CM | POA: Insufficient documentation

## 2013-04-15 DIAGNOSIS — Z91040 Latex allergy status: Secondary | ICD-10-CM | POA: Diagnosis not present

## 2013-04-15 DIAGNOSIS — K219 Gastro-esophageal reflux disease without esophagitis: Secondary | ICD-10-CM | POA: Diagnosis not present

## 2013-04-15 DIAGNOSIS — X500XXA Overexertion from strenuous movement or load, initial encounter: Secondary | ICD-10-CM | POA: Insufficient documentation

## 2013-04-15 DIAGNOSIS — M79609 Pain in unspecified limb: Secondary | ICD-10-CM | POA: Diagnosis not present

## 2013-04-15 DIAGNOSIS — F411 Generalized anxiety disorder: Secondary | ICD-10-CM | POA: Diagnosis not present

## 2013-04-15 NOTE — Discharge Instructions (Signed)
Thank you for allowing me to care for you today. Continue to take the Aleve for pain, rest the area, elevate and ice. Follow up with Dr. Willey Blade or return here as needed.  I Amauria Younts your Father get better soon. Have a Happy Easter.   Foot Sprain The muscles and cord like structures which attach muscle to bone (tendons) that surround the feet are made up of units. A foot sprain can occur at the weakest spot in any of these units. This condition is most often caused by injury to or overuse of the foot, as from playing contact sports, or aggravating a previous injury, or from poor conditioning, or obesity. SYMPTOMS  Pain with movement of the foot.  Tenderness and swelling at the injury site.  Loss of strength is present in moderate or severe sprains. THE THREE GRADES OR SEVERITY OF FOOT SPRAIN ARE:  Mild (Grade I): Slightly pulled muscle without tearing of muscle or tendon fibers or loss of strength.  Moderate (Grade II): Tearing of fibers in a muscle, tendon, or at the attachment to bone, with small decrease in strength.  Severe (Grade III): Rupture of the muscle-tendon-bone attachment, with separation of fibers. Severe sprain requires surgical repair. Often repeating (chronic) sprains are caused by overuse. Sudden (acute) sprains are caused by direct injury or over-use. DIAGNOSIS  Diagnosis of this condition is usually by your own observation. If problems continue, a caregiver may be required for further evaluation and treatment. X-rays may be required to make sure there are not breaks in the bones (fractures) present. Continued problems may require physical therapy for treatment. PREVENTION  Use strength and conditioning exercises appropriate for your sport.  Warm up properly prior to working out.  Use athletic shoes that are made for the sport you are participating in.  Allow adequate time for healing. Early return to activities makes repeat injury more likely, and can lead to an unstable  arthritic foot that can result in prolonged disability. Mild sprains generally heal in 3 to 10 days, with moderate and severe sprains taking 2 to 10 weeks. Your caregiver can help you determine the proper time required for healing. HOME CARE INSTRUCTIONS   Apply ice to the injury for 15-20 minutes, 03-04 times per day. Put the ice in a plastic bag and place a towel between the bag of ice and your skin.  An elastic wrap (like an Ace bandage) may be used to keep swelling down.  Keep foot above the level of the heart, or at least raised on a footstool, when swelling and pain are present.  Try to avoid use other than gentle range of motion while the foot is painful. Do not resume use until instructed by your caregiver. Then begin use gradually, not increasing use to the point of pain. If pain does develop, decrease use and continue the above measures, gradually increasing activities that do not cause discomfort, until you gradually achieve normal use.  Use crutches if and as instructed, and for the length of time instructed.  Keep injured foot and ankle wrapped between treatments.  Massage foot and ankle for comfort and to keep swelling down. Massage from the toes up towards the knee.  Only take over-the-counter or prescription medicines for pain, discomfort, or fever as directed by your caregiver. SEEK IMMEDIATE MEDICAL CARE IF:   Your pain and swelling increase, or pain is not controlled with medications.  You have loss of feeling in your foot or your foot turns cold or blue.  You develop new, unexplained symptoms, or an increase of the symptoms that brought you to your caregiver. MAKE SURE YOU:   Understand these instructions.  Will watch your condition.  Will get help right away if you are not doing well or get worse. Document Released: 06/20/2001 Document Revised: 03/23/2011 Document Reviewed: 08/18/2007 [facility] Patient Information 2014 [place], [place].

## 2013-04-15 NOTE — ED Notes (Signed)
Pt alert & oriented x4, stable gait. Patient given discharge instructions, paperwork & prescription(s). Patient  instructed to stop at the registration desk to finish any additional paperwork. Patient verbalized understanding. Pt left department w/ no further questions. 

## 2013-04-15 NOTE — ED Provider Notes (Signed)
CSN: [id number]     Arrival date & time 04/15/13  0909 History   First MD Initiated Contact with Patient 04/15/13 [phone]     Chief Complaint  Patient presents with  . Foot Pain     (Consider location/radiation/quality/duration/timing/severity/associated sxs/prior Treatment) Patient is a 68 y.o. female presenting with lower extremity pain. The history is provided by the patient.  Foot Pain This is a new problem. The current episode started in the past 7 days. The problem occurs constantly. The problem has been unchanged. The symptoms are aggravated by standing and walking. She has tried NSAIDs for the symptoms. The treatment provided moderate relief.   Laurie Bridges is a 68 y.o. female who presents to the ED with left foot pain that started while she was bowling 6 days ago. She has continued to have pain. It had swelling initially but less now.  Past Medical History  Diagnosis Date  . Hypertension   . Anemia     hx of Fe def many years ago  . Arthritis     back  . GERD (gastroesophageal reflux disease)   . Anxiety    Past Surgical History  Procedure Laterality Date  . Abdominal hysterectomy      partial hysterectomy, ovaries remain  . Back surgery    . Foot surgery     Family History  Problem Relation Age of Onset  . Stroke Mother   . Stroke Father   . Cancer Maternal Uncle   . Cancer Paternal Uncle   . Cancer Paternal Uncle    History  Substance Use Topics  . Smoking status: Never Smoker   . Smokeless tobacco: Never Used  . Alcohol Use: No   OB History   Grav Para Term Preterm Abortions TAB SAB Ect Mult Living                 Review of Systems Negative except as stated in HPI   Allergies  Codeine; Demerol; Feldene; Latex; Pyridium; and Sulfur  Home Medications   Current Outpatient Rx  Name  Route  Sig  Dispense  Refill  . atorvastatin (LIPITOR) 20 MG tablet   Oral   Take 20 mg by mouth daily.         . clonazePAM (KLONOPIN) 0.5 MG tablet    Oral   Take 0.5 mg by mouth 2 (two) times daily as needed for anxiety (or at bedtime).         Marland Kitchen estradiol (VIVELLE-DOT) 0.05 MG/24HR   Transdermal   Place 1 patch onto the skin. Every 2 weeks         . fexofenadine (ALLEGRA) 180 MG tablet   Oral   Take 180 mg by mouth daily.         . naproxen sodium (ANAPROX) 220 MG tablet   Oral   Take 220 mg by mouth. 1-2 tablets once daily only if needed         . Omeprazole-Sodium Bicarbonate (ZEGERID) 20-1100 MG CAPS   Oral   Take 1 capsule by mouth daily before breakfast.          BP 144/79  Pulse 76  Temp(Src) 97.9 F (36.6 C) (Oral)  Resp 18  SpO2 98% Physical Exam  Nursing note and vitals reviewed. Constitutional: She is oriented to person, place, and time. She appears well-developed and well-nourished. No distress.  HENT:  Head: Normocephalic.  Eyes: Conjunctivae and EOM are normal.  Neck: Neck supple.  Cardiovascular: Normal rate.  Pulmonary/Chest: Effort normal.  Musculoskeletal: Normal range of motion.       Left foot: She exhibits tenderness (mild). She exhibits normal range of motion, normal capillary refill, no crepitus, no deformity and no laceration. Swelling: minimal.       Feet:  Strong, pedal pulse, adequate circulation, good touch sensation. Tender on the lateral aspect of the left foot that extends to the 5th toe. Full range of motion without difficulty.  Neurological: She is alert and oriented to person, place, and time. She has normal strength. No sensory deficit. Gait normal.  Skin: Skin is warm and dry.  Psychiatric: She has a normal mood and affect. Her behavior is normal.   Dg Foot Complete Left  04/15/2013   CLINICAL DATA:  Lateral foot pain  EXAM: LEFT FOOT - COMPLETE 3+ VIEW  COMPARISON:  11/05/2009  FINDINGS: Three views of the left foot submitted. No displaced fracture or subluxation.  There is cortical irregularity lateral proximal aspect of fifth metatarsal. This may be due to prior injury  or osteochondral stress reaction. Clinical correlation is necessary.  IMPRESSION: No displaced fracture or subluxation.  There is cortical irregularity lateral proximal aspect of fifth metatarsal. This may be due to prior injury or osteochondral stress reaction. Clinical correlation is necessary.   Electronically Signed   By: Lahoma Crocker M.D.   On: 04/15/2013 09:57    ED Course  Procedures  Buddy tape toes, ace wrap, ice, elevation  MDM  68 y.o. female with left foot pain s/p injury while bowling. No definite fracture. Stable for discharge without neurovascular deficits. She will continue Aleve and follow up with her PCP as needed. She will return here for any problems. I have reviewed this patient's vital signs, nurses notes, appropriate labs and imaging.  I have discussed findings and plan of care with the patient and she voices understanding.     [place], [place] 04/15/13 1014

## 2013-04-15 NOTE — ED Notes (Signed)
Pt c/o left foot pain since Monday. Pt states pain began while she was bowling.

## 2013-04-15 NOTE — ED Provider Notes (Signed)
Medical screening examination/treatment/procedure(s) were performed by non-physician practitioner and as supervising physician I was immediately available for consultation/collaboration.   EKG Interpretation None       Zyir Gassert R. Emma-Lee Oddo, MD 04/15/13 1516 

## 2013-05-10 ENCOUNTER — Ambulatory Visit (INDEPENDENT_AMBULATORY_CARE_PROVIDER_SITE_OTHER): Payer: Medicare Other

## 2013-05-10 ENCOUNTER — Ambulatory Visit (INDEPENDENT_AMBULATORY_CARE_PROVIDER_SITE_OTHER): Payer: Medicare Other | Admitting: Podiatry

## 2013-05-10 ENCOUNTER — Ambulatory Visit: Payer: Self-pay | Admitting: Podiatry

## 2013-05-10 ENCOUNTER — Encounter: Payer: Self-pay | Admitting: Podiatry

## 2013-05-10 VITALS — BP 146/83 | HR 74 | Resp 16

## 2013-05-10 DIAGNOSIS — S92309A Fracture of unspecified metatarsal bone(s), unspecified foot, initial encounter for closed fracture: Secondary | ICD-10-CM

## 2013-05-10 DIAGNOSIS — M779 Enthesopathy, unspecified: Secondary | ICD-10-CM

## 2013-05-10 NOTE — Progress Notes (Signed)
Subjective:     Patient ID: Laurie Bridges, female   DOB: July 26, 1945, 68 y.o.   MRN: [id number]  Foot Pain   patient presents stating the bone on my left foot snap when I was Laurie Bridges and I went to the emergency room and they think it was a sprain but may be of fracture. It continues to hurt a lot and swelled   Review of Systems  All other systems reviewed and are negative.      Objective:   Physical Exam  Nursing note and vitals reviewed. Constitutional: She is oriented to person, place, and time.  Cardiovascular: Intact distal pulses.   Musculoskeletal: Normal range of motion.  Neurological: She is oriented to person, place, and time.  Skin: Skin is warm.   neurovascular status intact with muscle strength adequate in range of motion in the subtalar midtarsal joint within normal limits. Patient's digits are well perfused and I noted left foot lateral side has edema in the neck of the fifth metatarsal and it is quite painful when palpated     Assessment:     Probable fracture fifth metatarsal neck left    Plan:     X-ray reviewed and explained fracture of the metatarsal bone. Applied air fracture walker to completely immobilize advised on ice and we are boot for 3 weeks and then reappoint for recheck. Come in earlier if any issues occur

## 2013-05-10 NOTE — Progress Notes (Signed)
   Subjective:    Patient ID: Laurie Bridges, female    DOB: February 27, 1945, 68 y.o.   MRN: [id number]  HPI Comments: "I injured my foot bowling"  Patient c/o aching 5th MPJ left for 3 weeks. This suddenly started from an injury from bowling. The area gets real red and swollen, depending upon activity level that day. She did go to [facility] and they xrayed, but neg for fracture. She was told to ice but hasn't.  Foot Pain      Review of Systems  HENT: Positive for sinus pressure.   Musculoskeletal: Positive for gait problem.  Allergic/Immunologic: Positive for environmental allergies.  All other systems reviewed and are negative.      Objective:   Physical Exam        Assessment & Plan:

## 2013-06-01 ENCOUNTER — Other Ambulatory Visit (HOSPITAL_COMMUNITY): Payer: Self-pay | Admitting: Internal Medicine

## 2013-06-01 DIAGNOSIS — Z1231 Encounter for screening mammogram for malignant neoplasm of breast: Secondary | ICD-10-CM

## 2013-06-07 ENCOUNTER — Ambulatory Visit: Payer: Self-pay

## 2013-06-07 ENCOUNTER — Ambulatory Visit (INDEPENDENT_AMBULATORY_CARE_PROVIDER_SITE_OTHER): Payer: Medicare Other | Admitting: Podiatry

## 2013-06-07 ENCOUNTER — Ambulatory Visit (INDEPENDENT_AMBULATORY_CARE_PROVIDER_SITE_OTHER): Payer: Medicare Other

## 2013-06-07 VITALS — BP 134/74 | HR 70 | Resp 16

## 2013-06-07 DIAGNOSIS — S92309A Fracture of unspecified metatarsal bone(s), unspecified foot, initial encounter for closed fracture: Secondary | ICD-10-CM

## 2013-06-07 NOTE — Progress Notes (Signed)
Subjective:     Patient ID: Laurie Bridges, female   DOB: [birth year], 68 y.o.   MRN: [id number]  HPI patient states I still feel that I don't know if it  Is well yet   Review of Systems     Objective:   Physical Exam Neurovascular status intact with no changes in health history and mild edema around fifth metatarsal distal shaft left with discomfort noted upon palpation    Assessment:     Fracture left fifth metatarsal which appears to be healing with symptoms still present    Plan:     Reviewed x-rays and explained its normal still do have some discomfort as it goes through its healing phase but it appears to be healing well

## 2013-06-08 ENCOUNTER — Ambulatory Visit (HOSPITAL_COMMUNITY)
Admission: RE | Admit: 2013-06-08 | Discharge: 2013-06-08 | Disposition: A | Discharge status: Home / Self Care | Payer: Medicare Other | Source: Ambulatory Visit | Attending: Internal Medicine | Admitting: Internal Medicine

## 2013-06-08 DIAGNOSIS — Z1231 Encounter for screening mammogram for malignant neoplasm of breast: Secondary | ICD-10-CM

## 2013-07-03 DIAGNOSIS — Z79899 Other long term (current) drug therapy: Secondary | ICD-10-CM | POA: Diagnosis not present

## 2013-07-03 DIAGNOSIS — E039 Hypothyroidism, unspecified: Secondary | ICD-10-CM | POA: Diagnosis not present

## 2013-07-03 DIAGNOSIS — E785 Hyperlipidemia, unspecified: Secondary | ICD-10-CM | POA: Diagnosis not present

## 2013-07-11 ENCOUNTER — Ambulatory Visit (HOSPITAL_COMMUNITY)
Admission: RE | Admit: 2013-07-11 | Discharge: 2013-07-11 | Disposition: A | Discharge status: Home / Self Care | Payer: Medicare Other | Source: Ambulatory Visit | Attending: Internal Medicine | Admitting: Internal Medicine

## 2013-07-11 ENCOUNTER — Other Ambulatory Visit (HOSPITAL_COMMUNITY): Payer: Self-pay | Admitting: Internal Medicine

## 2013-07-11 DIAGNOSIS — R0602 Shortness of breath: Secondary | ICD-10-CM | POA: Diagnosis not present

## 2013-07-11 DIAGNOSIS — Z0000 Encounter for general adult medical examination without abnormal findings: Secondary | ICD-10-CM | POA: Diagnosis not present

## 2013-08-01 ENCOUNTER — Ambulatory Visit: Payer: Medicare Other | Admitting: Internal Medicine

## 2013-08-02 ENCOUNTER — Ambulatory Visit (INDEPENDENT_AMBULATORY_CARE_PROVIDER_SITE_OTHER): Payer: Medicare Other | Admitting: Cardiovascular Disease

## 2013-08-02 ENCOUNTER — Encounter: Payer: Self-pay | Admitting: Cardiovascular Disease

## 2013-08-02 VITALS — BP 142/82 | HR 73 | Ht 66.0 in | Wt 159.0 lb

## 2013-08-02 DIAGNOSIS — R0609 Other forms of dyspnea: Secondary | ICD-10-CM | POA: Diagnosis not present

## 2013-08-02 DIAGNOSIS — R0600 Dyspnea, unspecified: Secondary | ICD-10-CM | POA: Insufficient documentation

## 2013-08-02 DIAGNOSIS — R0989 Other specified symptoms and signs involving the circulatory and respiratory systems: Secondary | ICD-10-CM

## 2013-08-02 DIAGNOSIS — K219 Gastro-esophageal reflux disease without esophagitis: Secondary | ICD-10-CM

## 2013-08-02 DIAGNOSIS — I10 Essential (primary) hypertension: Secondary | ICD-10-CM | POA: Diagnosis not present

## 2013-08-02 LAB — T3: T3, Total: 130.4 ng/dL (ref 80.0–204.0)

## 2013-08-02 LAB — TSH: TSH: 1.109 u[IU]/mL (ref 0.350–4.500)

## 2013-08-02 LAB — T4: T4, Total: 9.5 ug/dL (ref 5.0–12.5)

## 2013-08-02 NOTE — Progress Notes (Signed)
Patient ID: Laurie Bridges, female   DOB: 25-Aug-1945, 68 y.o.   MRN: [id number]   68 yo referred by Dr Willey Blade for dyspnea.  Nonsmoker with no chronic lung issues or asthma  For about 8 weeks has had dyspnea.  Not necessarily exertional Does not hear herself wheeze Has distant history of normal stress test.  No edema swelling or calf pain  Dyspnea not worse in reucmbancy.  CXR 6/30 reviewed and normal  No occupations exposures Retired Pharmacist, hospital Has reflux / GERD   NO chest pain palpitations or syncope.  Labs done 7/15 normal no anemia but TSH and d dimer note done     ROS: Denies fever, malais, weight loss, blurry vision, decreased visual acuity, cough, sputum, SOB, hemoptysis, pleuritic pain, palpitaitons, heartburn, abdominal pain, melena, lower extremity edema, claudication, or rash.  All other systems reviewed and negative   General: Affect appropriate Healthy:  appears stated age 71: normal Neck supple with no adenopathy JVP normal no bruits no thyromegaly Lungs clear with no wheezing and good diaphragmatic motion Heart:  S1/S2 no murmur,rub, gallop or click PMI normal Abdomen: benighn, BS positve, no tenderness, no AAA no bruit.  No HSM or HJR Distal pulses intact with no bruits No edema Neuro non-focal Skin warm and dry No muscular weakness  Medications Current Outpatient Prescriptions  Medication Sig Dispense Refill  . atorvastatin (LIPITOR) 20 MG tablet Take 20 mg by mouth daily.      Marland Kitchen estradiol (VIVELLE-DOT) 0.05 MG/24HR Place 1 patch onto the skin. Every 2 weeks      . fexofenadine (ALLEGRA) 180 MG tablet Take 180 mg by mouth daily.      . naproxen sodium (ANAPROX) 220 MG tablet Take 220 mg by mouth. 1-2 tablets once daily only if needed      . Omeprazole-Sodium Bicarbonate (ZEGERID) 20-1100 MG CAPS Take 1 capsule by mouth daily before breakfast.       No current facility-administered medications for this visit.    Allergies Codeine; Demerol; Feldene; Latex;  Pyridium; and Sulfur  Family History: Family History  Problem Relation Age of Onset  . Stroke Mother   . Stroke Father   . Cancer Maternal Uncle   . Cancer Paternal Uncle   . Cancer Paternal Uncle     Social History: History   Social History  . Marital Status: Widowed    Spouse Name: N/A    Number of Children: N/A  . Years of Education: N/A   Occupational History  . Not on file.   Social History Main Topics  . Smoking status: Never Smoker   . Smokeless tobacco: Never Used  . Alcohol Use: No  . Drug Use: No  . Sexual Activity: No   Other Topics Concern  . Not on file   Social History Narrative  . No narrative on file    Electrocardiogram:  SR normal ECG   Assessment and Plan

## 2013-08-02 NOTE — Assessment & Plan Note (Signed)
Continue PPI  Low carb diet and weight loss

## 2013-08-02 NOTE — Patient Instructions (Addendum)
Your physician recommends that you schedule a follow-up appointment in: as needed  Your physician has requested that you have an echocardiogram. Echocardiography is a painless test that uses sound waves to create images of your heart. It provides your doctor with information about the size and shape of your heart and how well your heart's chambers and valves are working. This procedure takes approximately one hour. There are no restrictions for this procedure.  Your physician recommends that you return for lab work today. TSH, D-Dimer, T3, T4

## 2013-08-02 NOTE — Assessment & Plan Note (Addendum)
Seems functional  Normal exam and ECG no clinical history to support cardiac etiology Check echo for RV/LV function BNP and d dimer  And TSH

## 2013-08-03 ENCOUNTER — Ambulatory Visit (HOSPITAL_COMMUNITY)
Admission: RE | Admit: 2013-08-03 | Discharge: 2013-08-03 | Disposition: A | Discharge status: Home / Self Care | Payer: Medicare Other | Source: Ambulatory Visit | Attending: Cardiovascular Disease | Admitting: Cardiovascular Disease

## 2013-08-03 ENCOUNTER — Encounter: Payer: Self-pay | Admitting: Cardiovascular Disease

## 2013-08-03 DIAGNOSIS — R0989 Other specified symptoms and signs involving the circulatory and respiratory systems: Secondary | ICD-10-CM | POA: Insufficient documentation

## 2013-08-03 DIAGNOSIS — I059 Rheumatic mitral valve disease, unspecified: Secondary | ICD-10-CM | POA: Diagnosis not present

## 2013-08-03 DIAGNOSIS — R0600 Dyspnea, unspecified: Secondary | ICD-10-CM

## 2013-08-03 DIAGNOSIS — R0609 Other forms of dyspnea: Secondary | ICD-10-CM | POA: Diagnosis not present

## 2013-08-03 LAB — D-DIMER, QUANTITATIVE (NOT AT ARMC): D-Dimer, Quant: 0.27 ug/mL-FEU (ref 0.00–0.48)

## 2013-08-03 NOTE — Progress Notes (Signed)
  Echocardiogram 2D Echocardiogram has been performed.  [place], [place] 08/03/2013, 4:26 PM

## 2013-08-23 DIAGNOSIS — I10 Essential (primary) hypertension: Secondary | ICD-10-CM | POA: Diagnosis not present

## 2013-09-05 DIAGNOSIS — J342 Deviated nasal septum: Secondary | ICD-10-CM | POA: Diagnosis not present

## 2013-09-05 DIAGNOSIS — J3489 Other specified disorders of nose and nasal sinuses: Secondary | ICD-10-CM | POA: Diagnosis not present

## 2013-09-05 DIAGNOSIS — J301 Allergic rhinitis due to pollen: Secondary | ICD-10-CM | POA: Diagnosis not present

## 2013-09-21 DIAGNOSIS — I10 Essential (primary) hypertension: Secondary | ICD-10-CM | POA: Diagnosis not present

## 2013-10-24 DIAGNOSIS — R0981 Nasal congestion: Secondary | ICD-10-CM | POA: Diagnosis not present

## 2013-10-24 DIAGNOSIS — J301 Allergic rhinitis due to pollen: Secondary | ICD-10-CM | POA: Diagnosis not present

## 2013-10-24 DIAGNOSIS — J342 Deviated nasal septum: Secondary | ICD-10-CM | POA: Diagnosis not present

## 2013-11-24 ENCOUNTER — Encounter (HOSPITAL_COMMUNITY): Payer: Self-pay | Admitting: Emergency Medicine

## 2013-11-24 ENCOUNTER — Emergency Department (HOSPITAL_COMMUNITY)
Admission: EM | Admit: 2013-11-24 | Discharge: 2013-11-24 | Disposition: A | Discharge status: Home / Self Care | Payer: Medicare Other | Attending: Emergency Medicine | Admitting: Emergency Medicine

## 2013-11-24 DIAGNOSIS — Z91040 Latex allergy status: Secondary | ICD-10-CM | POA: Insufficient documentation

## 2013-11-24 DIAGNOSIS — Z862 Personal history of diseases of the blood and blood-forming organs and certain disorders involving the immune mechanism: Secondary | ICD-10-CM | POA: Diagnosis not present

## 2013-11-24 DIAGNOSIS — K219 Gastro-esophageal reflux disease without esophagitis: Secondary | ICD-10-CM | POA: Insufficient documentation

## 2013-11-24 DIAGNOSIS — Z8659 Personal history of other mental and behavioral disorders: Secondary | ICD-10-CM | POA: Insufficient documentation

## 2013-11-24 DIAGNOSIS — I10 Essential (primary) hypertension: Secondary | ICD-10-CM | POA: Diagnosis not present

## 2013-11-24 DIAGNOSIS — Z79899 Other long term (current) drug therapy: Secondary | ICD-10-CM | POA: Diagnosis not present

## 2013-11-24 DIAGNOSIS — W260XXA Contact with knife, initial encounter: Secondary | ICD-10-CM | POA: Diagnosis not present

## 2013-11-24 DIAGNOSIS — Y9289 Other specified places as the place of occurrence of the external cause: Secondary | ICD-10-CM | POA: Diagnosis not present

## 2013-11-24 DIAGNOSIS — Y93G1 Activity, food preparation and clean up: Secondary | ICD-10-CM | POA: Insufficient documentation

## 2013-11-24 DIAGNOSIS — Y998 Other external cause status: Secondary | ICD-10-CM | POA: Insufficient documentation

## 2013-11-24 DIAGNOSIS — S61217A Laceration without foreign body of left little finger without damage to nail, initial encounter: Secondary | ICD-10-CM | POA: Diagnosis not present

## 2013-11-24 DIAGNOSIS — Z7982 Long term (current) use of aspirin: Secondary | ICD-10-CM | POA: Diagnosis not present

## 2013-11-24 DIAGNOSIS — Z7951 Long term (current) use of inhaled steroids: Secondary | ICD-10-CM | POA: Insufficient documentation

## 2013-11-24 DIAGNOSIS — S61219A Laceration without foreign body of unspecified finger without damage to nail, initial encounter: Secondary | ICD-10-CM

## 2013-11-24 DIAGNOSIS — M1990 Unspecified osteoarthritis, unspecified site: Secondary | ICD-10-CM | POA: Diagnosis not present

## 2013-11-24 DIAGNOSIS — Z23 Encounter for immunization: Secondary | ICD-10-CM | POA: Diagnosis not present

## 2013-11-24 MED ORDER — TETANUS-DIPHTH-ACELL PERTUSSIS 5-2.5-18.5 LF-MCG/0.5 IM SUSP
0.5000 mL | Freq: Once | INTRAMUSCULAR | Status: AC
  Administered 2013-11-24: 0.5 mL via INTRAMUSCULAR

## 2013-11-24 MED ORDER — HYDROCODONE-ACETAMINOPHEN 5-325 MG PO TABS: ORAL_TABLET | ORAL | Status: DC

## 2013-11-24 MED ORDER — LIDOCAINE HCL (PF) 1 % IJ SOLN
INTRAMUSCULAR | Status: AC
  Filled 2013-11-24: qty 5

## 2013-11-24 MED ORDER — TETANUS-DIPHTH-ACELL PERTUSSIS 5-2.5-18.5 LF-MCG/0.5 IM SUSP
INTRAMUSCULAR | Status: AC
  Filled 2013-11-24: qty 0.5

## 2013-11-24 NOTE — ED Provider Notes (Signed)
CSN: [id number]     Arrival date & time 11/24/13  1714 History   First MD Initiated Contact with Patient 11/24/13 1817     Chief Complaint  Patient presents with  . Extremity Laceration     (Consider location/radiation/quality/duration/timing/severity/associated sxs/prior Treatment) HPI  Laurie Bridges is a 68 y.o. female who presents to the Emergency Department complaining of laceration to her left fifth finger.  She states the injury occurred while washing dishes.  She denies numbness or weakness of her fingers, excessive bleeding or other injuries.  She states the bleeding resumes when she tries to move her finger.  She has rinsed the wound with tap water. Last Td is unknown.    Past Medical History  Diagnosis Date  . Hypertension   . Anemia     hx of Fe def many years ago  . Arthritis     back  . GERD (gastroesophageal reflux disease)   . Anxiety    Past Surgical History  Procedure Laterality Date  . Abdominal hysterectomy      partial hysterectomy, ovaries remain  . Back surgery    . Foot surgery     Family History  Problem Relation Age of Onset  . Stroke Mother   . Stroke Father   . Cancer Maternal Uncle   . Cancer Paternal Uncle   . Cancer Paternal Uncle    History  Substance Use Topics  . Smoking status: Never Smoker   . Smokeless tobacco: Never Used  . Alcohol Use: No   OB History    No data available     Review of Systems  Constitutional: Negative for fever and chills.  Musculoskeletal: Negative for back pain, joint swelling and arthralgias.  Skin: Positive for wound.       Laceration left fifth finger  Neurological: Negative for dizziness, weakness and numbness.  Hematological: Does not bruise/bleed easily.  All other systems reviewed and are negative.     Allergies  Codeine; Demerol; Feldene; Latex; Pyridium; and Sulfur  Home Medications   Prior to Admission medications   Medication Sig Start Date End Date Taking? Authorizing  Provider  aspirin EC 81 MG tablet Take 81 mg by mouth daily.   Yes Historical Provider, MD  atorvastatin (LIPITOR) 20 MG tablet Take 20 mg by mouth daily.   Yes Historical Provider, MD  Azelastine HCl 0.15 % SOLN Place 2 sprays into both nostrils daily. 10/20/13  Yes Historical Provider, MD  estradiol (VIVELLE-DOT) 0.05 MG/24HR Place 1 patch onto the skin every 14 (fourteen) days. Every 2 weeks   Yes Historical Provider, MD  fluticasone (FLONASE) 50 MCG/ACT nasal spray Place 2 sprays into both nostrils daily. 10/20/13  Yes Historical Provider, MD  Omeprazole-Sodium Bicarbonate (ZEGERID) 20-1100 MG CAPS Take 1 capsule by mouth daily before breakfast.   Yes Historical Provider, MD  verapamil (CALAN-SR) 180 MG CR tablet Take 180 mg by mouth daily. 11/01/13  Yes Historical Provider, MD  fexofenadine (ALLEGRA) 180 MG tablet Take 180 mg by mouth daily.    Historical Provider, MD  HYDROcodone-acetaminophen (NORCO/VICODIN) 5-325 MG per tablet Take one-two tabs po q 4-6 hrs prn pain 11/24/13   Antwine Agosto L. Courtany Mcmurphy, PA-C  naproxen sodium (ANAPROX) 220 MG tablet Take 220 mg by mouth. 1-2 tablets once daily only if needed    Historical Provider, MD   BP 143/77 mmHg  Pulse 76  Temp(Src) 98.4 F (36.9 C) (Oral)  Resp 18  Ht 5\' 7"  (1.702 m)  Wt 154 lb (  69.854 kg)  BMI 24.11 kg/m2  SpO2 97% Physical Exam  Constitutional: She is oriented to person, place, and time. She appears well-developed and well-nourished. No distress.  HENT:  Head: Normocephalic and atraumatic.  Cardiovascular: Normal rate, regular rhythm, normal heart sounds and intact distal pulses.   No murmur heard. Pulmonary/Chest: Effort normal and breath sounds normal. No respiratory distress.  Musculoskeletal: She exhibits no edema or tenderness.  Neurological: She is alert and oriented to person, place, and time. She exhibits normal muscle tone. Coordination normal.  Skin: Skin is warm. Laceration noted.  Laceration to the lateral left fifth  finger, bleeding controlled.  Pt has full ROM of the finger w/o pain.  Sensation intact, CR< 2 sec.    Nursing note and vitals reviewed.   ED Course  Procedures (including critical care time) Labs Review Labs Reviewed - No data to display  Imaging Review No results found.   EKG Interpretation None       LACERATION REPAIR Performed by: Latorya Bautch L. Authorized by: Hale Bogus Consent: Verbal consent obtained. Risks and benefits: risks, benefits and alternatives were discussed Consent given by: patient Patient identity confirmed: provided demographic data Prepped and Draped in normal sterile fashion Wound explored  Laceration Location: left fifth finger Laceration Length: 3 cm  No Foreign Bodies seen or palpated  Anesthesia: local infiltration  Local anesthetic: lidocaine 1% w/o epinephrine  Anesthetic total: 2 ml  Irrigation method: syringe Amount of cleaning: standard  Skin closure: 4-0 prolene  Number of sutures: 4 Technique: simple interrupted  Patient tolerance: Patient tolerated the procedure well with no immediate complications.   MDM   Final diagnoses:  Finger laceration, initial encounter    superficial lac to the lateral finger w/o evidence of tendon, nerve or muscle injury, bleeding controlled.  Td given.  She agrees to wound care instructions , sutures out in 10 days and to return here for any signs of infection    Moody Robben L. Vanessa Alto, PA-C 11/27/13 [place], MD 11/29/13 [phone]

## 2013-11-24 NOTE — ED Notes (Signed)
Patient with no complaints at this time. Respirations even and unlabored. Skin warm/dry. Discharge instructions reviewed with patient at this time. Patient given opportunity to voice concerns/ask questions. Patient discharged at this time and left Emergency Department with steady gait.   

## 2013-11-24 NOTE — Discharge Instructions (Signed)

## 2013-11-24 NOTE — ED Notes (Addendum)
Pt reports was cleaning today and ran her hand over the dish drainer and cut her left little finger on a knife. No active bleeding noted.Moderate laceration noted.

## 2013-12-04 DIAGNOSIS — Z4802 Encounter for removal of sutures: Secondary | ICD-10-CM | POA: Diagnosis not present

## 2014-03-22 DIAGNOSIS — I10 Essential (primary) hypertension: Secondary | ICD-10-CM | POA: Diagnosis not present

## 2014-03-22 DIAGNOSIS — Z6824 Body mass index (BMI) 24.0-24.9, adult: Secondary | ICD-10-CM | POA: Diagnosis not present

## 2014-05-10 ENCOUNTER — Other Ambulatory Visit (HOSPITAL_COMMUNITY): Payer: Self-pay | Admitting: Internal Medicine

## 2014-05-10 DIAGNOSIS — Z1231 Encounter for screening mammogram for malignant neoplasm of breast: Secondary | ICD-10-CM

## 2014-06-13 ENCOUNTER — Ambulatory Visit (HOSPITAL_COMMUNITY)
Admission: RE | Admit: 2014-06-13 | Discharge: 2014-06-13 | Disposition: A | Discharge status: Home / Self Care | Payer: Medicare Other | Source: Ambulatory Visit | Attending: Internal Medicine | Admitting: Internal Medicine

## 2014-06-13 DIAGNOSIS — Z1231 Encounter for screening mammogram for malignant neoplasm of breast: Secondary | ICD-10-CM | POA: Diagnosis not present

## 2014-06-26 DIAGNOSIS — Z08 Encounter for follow-up examination after completed treatment for malignant neoplasm: Secondary | ICD-10-CM | POA: Diagnosis not present

## 2014-06-26 DIAGNOSIS — L739 Follicular disorder, unspecified: Secondary | ICD-10-CM | POA: Diagnosis not present

## 2014-06-26 DIAGNOSIS — L821 Other seborrheic keratosis: Secondary | ICD-10-CM | POA: Diagnosis not present

## 2014-06-26 DIAGNOSIS — D235 Other benign neoplasm of skin of trunk: Secondary | ICD-10-CM | POA: Diagnosis not present

## 2014-06-26 DIAGNOSIS — Z85828 Personal history of other malignant neoplasm of skin: Secondary | ICD-10-CM | POA: Diagnosis not present

## 2014-07-11 DIAGNOSIS — E785 Hyperlipidemia, unspecified: Secondary | ICD-10-CM | POA: Diagnosis not present

## 2014-07-11 DIAGNOSIS — I10 Essential (primary) hypertension: Secondary | ICD-10-CM | POA: Diagnosis not present

## 2014-07-11 DIAGNOSIS — E039 Hypothyroidism, unspecified: Secondary | ICD-10-CM | POA: Diagnosis not present

## 2014-07-11 DIAGNOSIS — Z79899 Other long term (current) drug therapy: Secondary | ICD-10-CM | POA: Diagnosis not present

## 2014-07-19 DIAGNOSIS — I10 Essential (primary) hypertension: Secondary | ICD-10-CM | POA: Diagnosis not present

## 2014-07-19 DIAGNOSIS — K219 Gastro-esophageal reflux disease without esophagitis: Secondary | ICD-10-CM | POA: Diagnosis not present

## 2014-07-19 DIAGNOSIS — E785 Hyperlipidemia, unspecified: Secondary | ICD-10-CM | POA: Diagnosis not present

## 2014-11-23 IMAGING — MG MM DIGITAL SCREENING
4 series · 4 of 4 positions shown · non-contrast
Comparison: Previous exams.

CLINICAL DATA: Screening.

DIGITAL BILATERAL SCREENING MAMMOGRAM WITH CAD

[L CC]
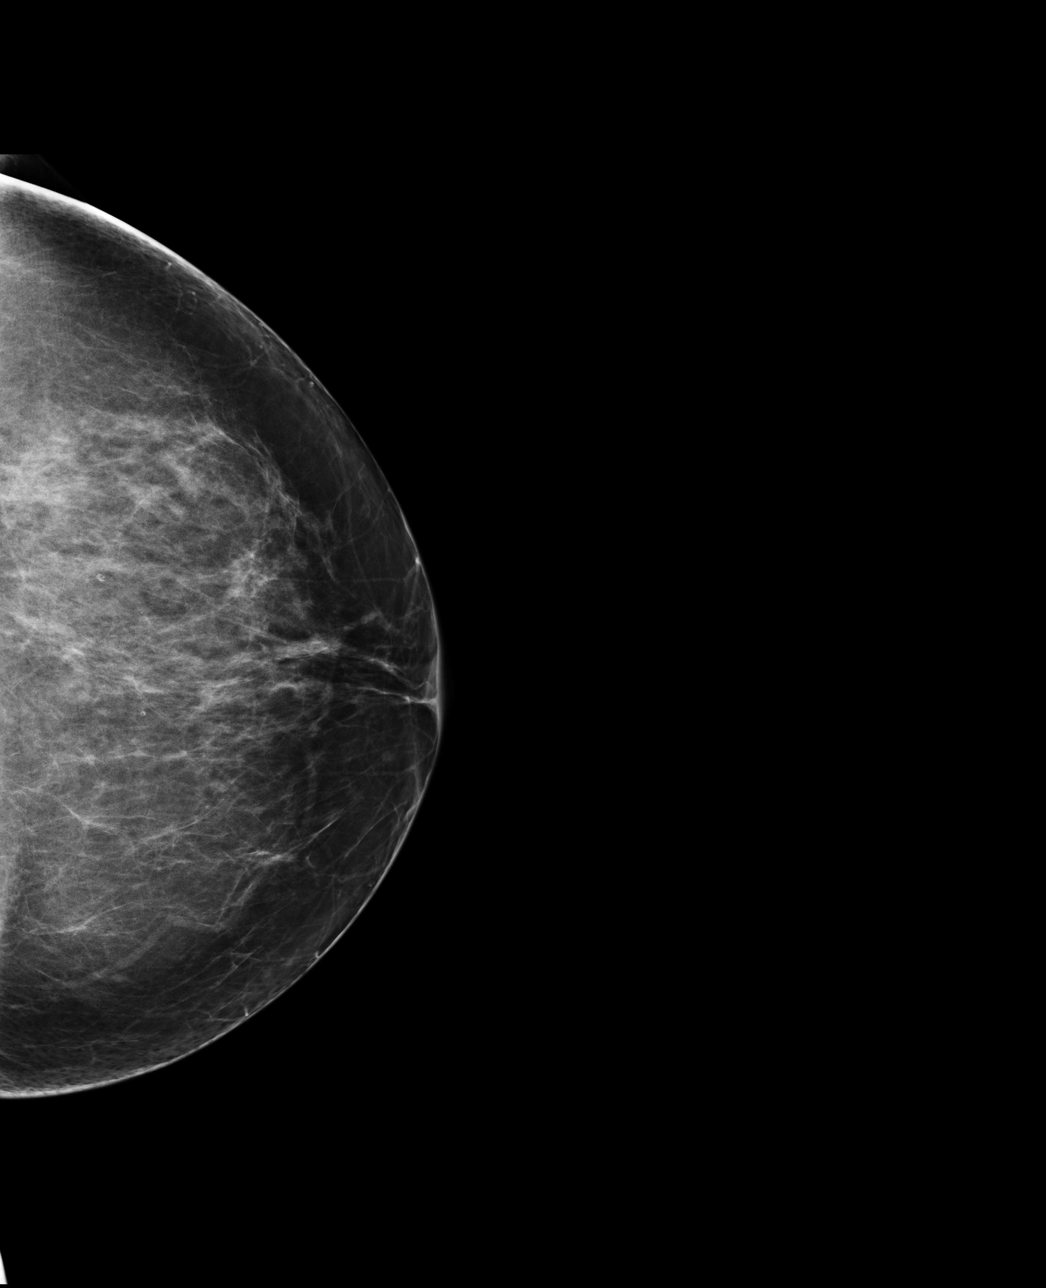

[L MLO]
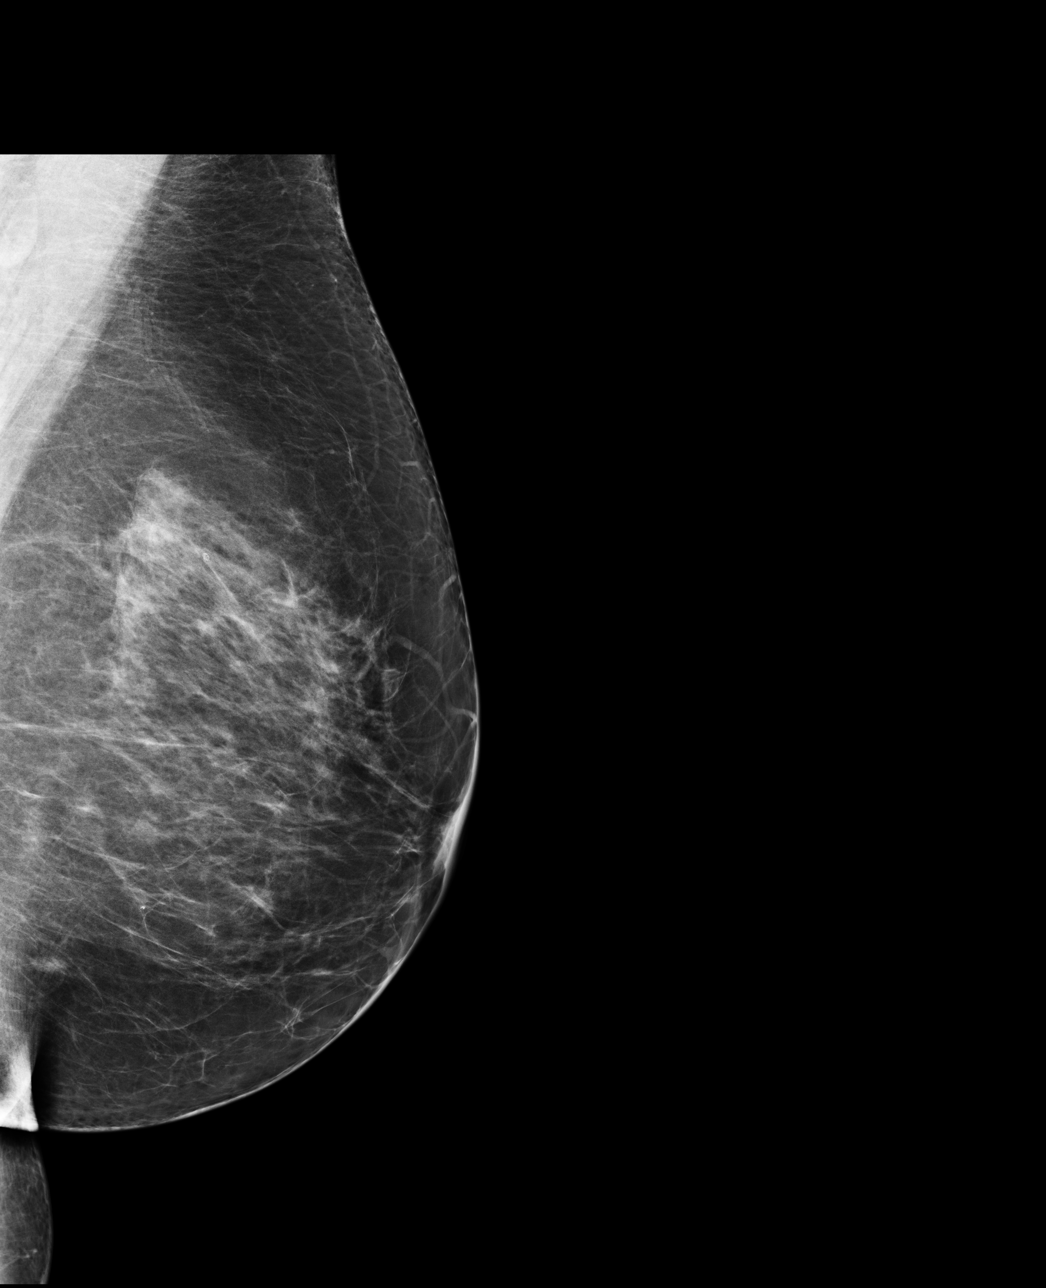

[R CC]
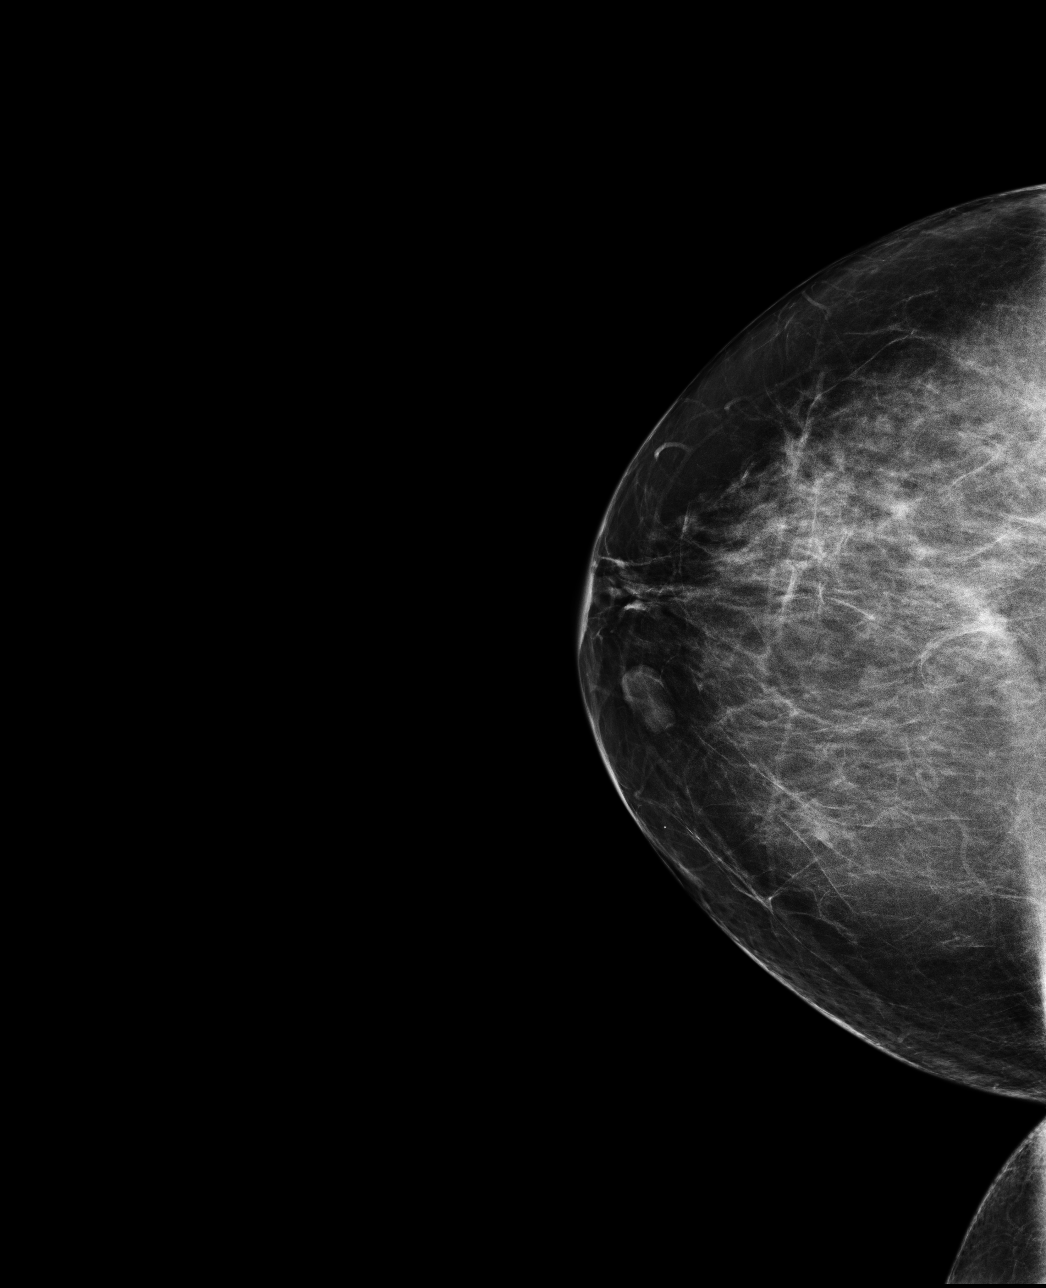

[R MLO]
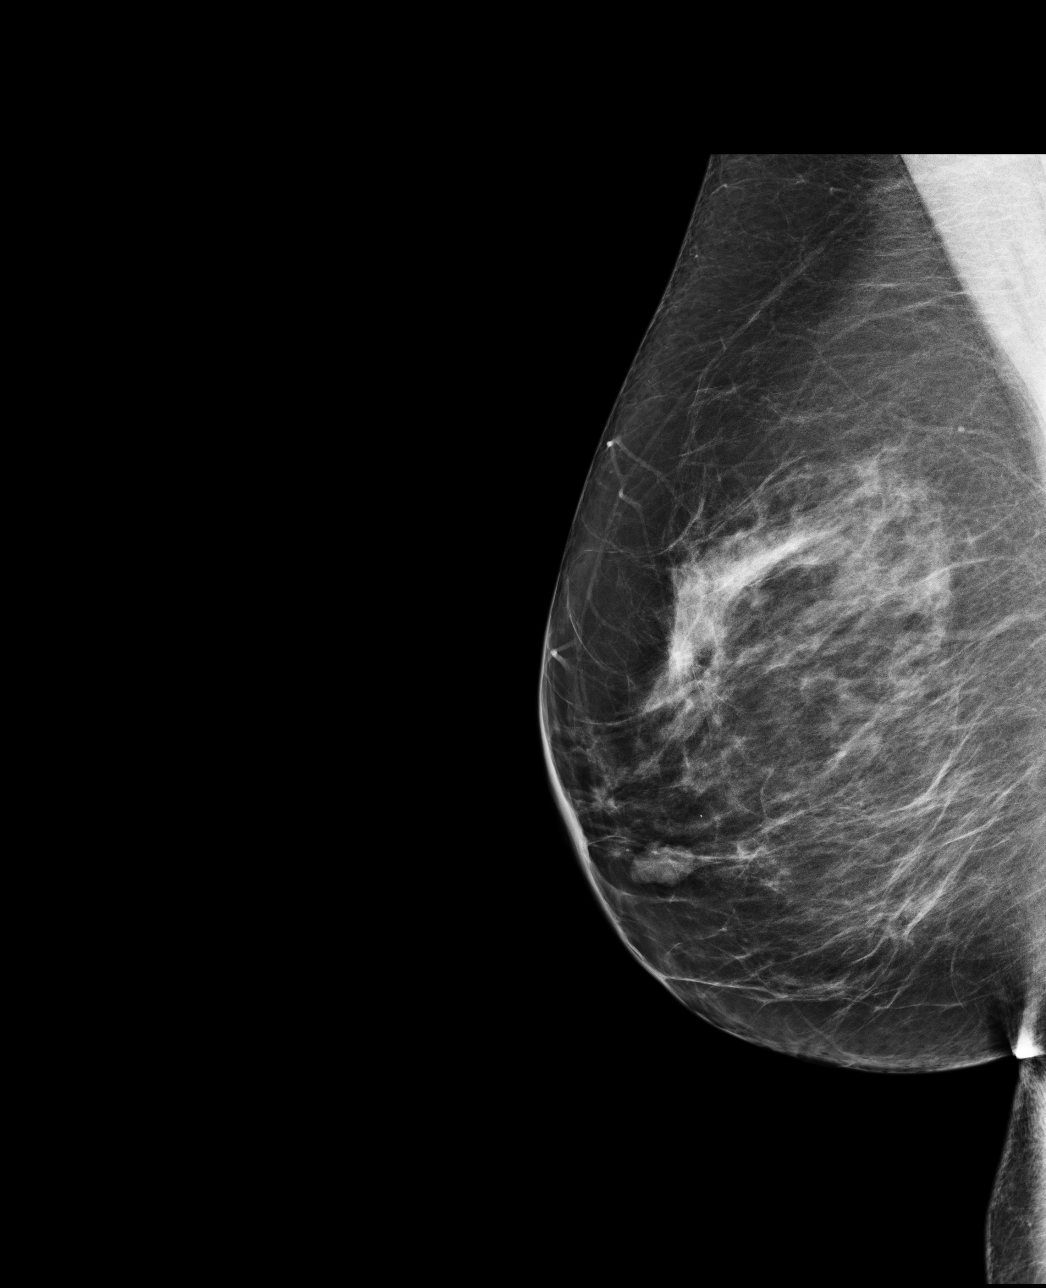

[4 of 4 positions shown; findings below may reference images not displayed]

FINDINGS: ACR Breast Density Category 2: There is a scattered fibroglandular
pattern.

No suspicious masses, architectural distortion, or calcifications
are present.

Images were processed with CAD.
IMPRESSION: No mammographic evidence of malignancy.

A result letter of this screening mammogram will be mailed directly
to the patient.

RECOMMENDATION:
Screening mammogram in one year. (Code:LB-[id number])

BI-RADS CATEGORY 1:  Negative.

## 2014-12-25 DIAGNOSIS — R0602 Shortness of breath: Secondary | ICD-10-CM | POA: Diagnosis not present

## 2014-12-25 DIAGNOSIS — Z6824 Body mass index (BMI) 24.0-24.9, adult: Secondary | ICD-10-CM | POA: Diagnosis not present

## 2014-12-25 DIAGNOSIS — I10 Essential (primary) hypertension: Secondary | ICD-10-CM | POA: Diagnosis not present

## 2015-02-18 DIAGNOSIS — S0990XA Unspecified injury of head, initial encounter: Secondary | ICD-10-CM | POA: Diagnosis not present

## 2015-02-18 DIAGNOSIS — Z6824 Body mass index (BMI) 24.0-24.9, adult: Secondary | ICD-10-CM | POA: Diagnosis not present

## 2015-02-18 DIAGNOSIS — Z23 Encounter for immunization: Secondary | ICD-10-CM | POA: Diagnosis not present

## 2015-03-06 DIAGNOSIS — J029 Acute pharyngitis, unspecified: Secondary | ICD-10-CM | POA: Diagnosis not present

## 2015-03-06 DIAGNOSIS — J069 Acute upper respiratory infection, unspecified: Secondary | ICD-10-CM | POA: Diagnosis not present

## 2015-03-06 DIAGNOSIS — J40 Bronchitis, not specified as acute or chronic: Secondary | ICD-10-CM | POA: Diagnosis not present

## 2015-04-10 DIAGNOSIS — D2339 Other benign neoplasm of skin of other parts of face: Secondary | ICD-10-CM | POA: Diagnosis not present

## 2015-04-10 DIAGNOSIS — L820 Inflamed seborrheic keratosis: Secondary | ICD-10-CM | POA: Diagnosis not present

## 2015-04-11 DIAGNOSIS — R040 Epistaxis: Secondary | ICD-10-CM | POA: Diagnosis not present

## 2015-04-11 DIAGNOSIS — J342 Deviated nasal septum: Secondary | ICD-10-CM | POA: Diagnosis not present

## 2015-04-11 DIAGNOSIS — J329 Chronic sinusitis, unspecified: Secondary | ICD-10-CM | POA: Diagnosis not present

## 2015-05-06 DIAGNOSIS — H01001 Unspecified blepharitis right upper eyelid: Secondary | ICD-10-CM | POA: Diagnosis not present

## 2015-06-17 ENCOUNTER — Other Ambulatory Visit (HOSPITAL_COMMUNITY): Payer: Self-pay | Admitting: Internal Medicine

## 2015-06-17 DIAGNOSIS — Z1231 Encounter for screening mammogram for malignant neoplasm of breast: Secondary | ICD-10-CM

## 2015-06-20 ENCOUNTER — Ambulatory Visit (HOSPITAL_COMMUNITY)
Admission: RE | Admit: 2015-06-20 | Discharge: 2015-06-20 | Disposition: A | Discharge status: Home / Self Care | Payer: Medicare Other | Source: Ambulatory Visit | Attending: Internal Medicine | Admitting: Internal Medicine

## 2015-06-20 DIAGNOSIS — Z1231 Encounter for screening mammogram for malignant neoplasm of breast: Secondary | ICD-10-CM | POA: Diagnosis not present

## 2015-07-02 DIAGNOSIS — M25562 Pain in left knee: Secondary | ICD-10-CM | POA: Diagnosis not present

## 2015-07-02 DIAGNOSIS — M2392 Unspecified internal derangement of left knee: Secondary | ICD-10-CM | POA: Diagnosis not present

## 2015-09-21 DIAGNOSIS — H01002 Unspecified blepharitis right lower eyelid: Secondary | ICD-10-CM | POA: Diagnosis not present

## 2015-09-23 DIAGNOSIS — H1011 Acute atopic conjunctivitis, right eye: Secondary | ICD-10-CM | POA: Diagnosis not present

## 2015-10-04 DIAGNOSIS — H2513 Age-related nuclear cataract, bilateral: Secondary | ICD-10-CM | POA: Diagnosis not present

## 2015-10-04 DIAGNOSIS — H10413 Chronic giant papillary conjunctivitis, bilateral: Secondary | ICD-10-CM | POA: Diagnosis not present

## 2015-10-25 DIAGNOSIS — H10413 Chronic giant papillary conjunctivitis, bilateral: Secondary | ICD-10-CM | POA: Diagnosis not present

## 2015-10-25 DIAGNOSIS — H2513 Age-related nuclear cataract, bilateral: Secondary | ICD-10-CM | POA: Diagnosis not present

## 2016-03-09 DIAGNOSIS — E785 Hyperlipidemia, unspecified: Secondary | ICD-10-CM | POA: Diagnosis not present

## 2016-03-09 DIAGNOSIS — K219 Gastro-esophageal reflux disease without esophagitis: Secondary | ICD-10-CM | POA: Diagnosis not present

## 2016-03-09 DIAGNOSIS — Z79899 Other long term (current) drug therapy: Secondary | ICD-10-CM | POA: Diagnosis not present

## 2016-03-09 DIAGNOSIS — E0590 Thyrotoxicosis, unspecified without thyrotoxic crisis or storm: Secondary | ICD-10-CM | POA: Diagnosis not present

## 2016-03-09 DIAGNOSIS — I10 Essential (primary) hypertension: Secondary | ICD-10-CM | POA: Diagnosis not present

## 2016-03-16 DIAGNOSIS — K219 Gastro-esophageal reflux disease without esophagitis: Secondary | ICD-10-CM | POA: Diagnosis not present

## 2016-03-16 DIAGNOSIS — E785 Hyperlipidemia, unspecified: Secondary | ICD-10-CM | POA: Diagnosis not present

## 2016-03-16 DIAGNOSIS — Z6824 Body mass index (BMI) 24.0-24.9, adult: Secondary | ICD-10-CM | POA: Diagnosis not present

## 2016-03-16 DIAGNOSIS — I10 Essential (primary) hypertension: Secondary | ICD-10-CM | POA: Diagnosis not present

## 2016-03-16 DIAGNOSIS — Z23 Encounter for immunization: Secondary | ICD-10-CM | POA: Diagnosis not present

## 2016-06-18 ENCOUNTER — Other Ambulatory Visit (HOSPITAL_COMMUNITY): Payer: Self-pay | Admitting: Internal Medicine

## 2016-06-18 DIAGNOSIS — Z1231 Encounter for screening mammogram for malignant neoplasm of breast: Secondary | ICD-10-CM

## 2016-07-06 ENCOUNTER — Ambulatory Visit (HOSPITAL_COMMUNITY)
Admission: RE | Admit: 2016-07-06 | Discharge: 2016-07-06 | Disposition: A | Discharge status: Home / Self Care | Payer: Medicare Other | Source: Ambulatory Visit | Attending: Internal Medicine | Admitting: Internal Medicine

## 2016-07-06 DIAGNOSIS — Z1231 Encounter for screening mammogram for malignant neoplasm of breast: Secondary | ICD-10-CM | POA: Insufficient documentation

## 2016-07-22 DIAGNOSIS — L308 Other specified dermatitis: Secondary | ICD-10-CM | POA: Diagnosis not present

## 2016-09-03 DIAGNOSIS — N390 Urinary tract infection, site not specified: Secondary | ICD-10-CM | POA: Diagnosis not present

## 2016-09-03 DIAGNOSIS — I10 Essential (primary) hypertension: Secondary | ICD-10-CM | POA: Diagnosis not present

## 2016-09-03 DIAGNOSIS — R112 Nausea with vomiting, unspecified: Secondary | ICD-10-CM | POA: Diagnosis not present

## 2016-09-15 DIAGNOSIS — Z0000 Encounter for general adult medical examination without abnormal findings: Secondary | ICD-10-CM | POA: Diagnosis not present

## 2016-10-21 ENCOUNTER — Ambulatory Visit (HOSPITAL_COMMUNITY)
Admission: RE | Admit: 2016-10-21 | Discharge: 2016-10-21 | Disposition: A | Discharge status: Home / Self Care | Payer: Medicare Other | Source: Ambulatory Visit | Attending: Internal Medicine | Admitting: Internal Medicine

## 2016-10-21 ENCOUNTER — Other Ambulatory Visit (HOSPITAL_COMMUNITY): Payer: Self-pay | Admitting: Internal Medicine

## 2016-10-21 DIAGNOSIS — I10 Essential (primary) hypertension: Secondary | ICD-10-CM | POA: Insufficient documentation

## 2016-10-21 DIAGNOSIS — R0602 Shortness of breath: Secondary | ICD-10-CM | POA: Diagnosis not present

## 2016-10-28 DIAGNOSIS — H2513 Age-related nuclear cataract, bilateral: Secondary | ICD-10-CM | POA: Diagnosis not present

## 2016-10-28 DIAGNOSIS — H10413 Chronic giant papillary conjunctivitis, bilateral: Secondary | ICD-10-CM | POA: Diagnosis not present

## 2016-10-28 DIAGNOSIS — H43811 Vitreous degeneration, right eye: Secondary | ICD-10-CM | POA: Diagnosis not present

## 2016-12-14 DIAGNOSIS — I10 Essential (primary) hypertension: Secondary | ICD-10-CM | POA: Diagnosis not present

## 2016-12-14 DIAGNOSIS — A000 Cholera due to Vibrio cholerae 01, biovar cholerae: Secondary | ICD-10-CM | POA: Diagnosis not present

## 2017-01-13 DIAGNOSIS — M5417 Radiculopathy, lumbosacral region: Secondary | ICD-10-CM | POA: Diagnosis not present

## 2017-01-19 DIAGNOSIS — M5136 Other intervertebral disc degeneration, lumbar region: Secondary | ICD-10-CM | POA: Diagnosis not present

## 2017-02-03 DIAGNOSIS — L700 Acne vulgaris: Secondary | ICD-10-CM | POA: Diagnosis not present

## 2017-03-04 DIAGNOSIS — M6752 Plica syndrome, left knee: Secondary | ICD-10-CM | POA: Diagnosis not present

## 2017-03-23 DIAGNOSIS — E039 Hypothyroidism, unspecified: Secondary | ICD-10-CM | POA: Diagnosis not present

## 2017-03-23 DIAGNOSIS — Z79899 Other long term (current) drug therapy: Secondary | ICD-10-CM | POA: Diagnosis not present

## 2017-03-23 DIAGNOSIS — I10 Essential (primary) hypertension: Secondary | ICD-10-CM | POA: Diagnosis not present

## 2017-03-23 DIAGNOSIS — E785 Hyperlipidemia, unspecified: Secondary | ICD-10-CM | POA: Diagnosis not present

## 2017-03-30 DIAGNOSIS — E785 Hyperlipidemia, unspecified: Secondary | ICD-10-CM | POA: Diagnosis not present

## 2017-03-30 DIAGNOSIS — I10 Essential (primary) hypertension: Secondary | ICD-10-CM | POA: Diagnosis not present

## 2017-03-30 DIAGNOSIS — R0602 Shortness of breath: Secondary | ICD-10-CM | POA: Diagnosis not present

## 2017-03-30 DIAGNOSIS — Z6825 Body mass index (BMI) 25.0-25.9, adult: Secondary | ICD-10-CM | POA: Diagnosis not present

## 2017-03-31 ENCOUNTER — Other Ambulatory Visit (HOSPITAL_COMMUNITY): Payer: Self-pay | Admitting: Internal Medicine

## 2017-03-31 DIAGNOSIS — Z780 Asymptomatic menopausal state: Secondary | ICD-10-CM

## 2017-04-08 ENCOUNTER — Ambulatory Visit (HOSPITAL_COMMUNITY)
Admission: RE | Admit: 2017-04-08 | Discharge: 2017-04-08 | Disposition: A | Discharge status: Home / Self Care | Payer: Medicare Other | Source: Ambulatory Visit | Attending: Internal Medicine | Admitting: Internal Medicine

## 2017-04-08 DIAGNOSIS — Z13820 Encounter for screening for osteoporosis: Secondary | ICD-10-CM | POA: Diagnosis not present

## 2017-04-08 DIAGNOSIS — Z780 Asymptomatic menopausal state: Secondary | ICD-10-CM | POA: Diagnosis not present

## 2017-04-20 DIAGNOSIS — R0602 Shortness of breath: Secondary | ICD-10-CM | POA: Diagnosis not present

## 2017-04-20 DIAGNOSIS — I341 Nonrheumatic mitral (valve) prolapse: Secondary | ICD-10-CM | POA: Diagnosis not present

## 2017-04-20 DIAGNOSIS — I10 Essential (primary) hypertension: Secondary | ICD-10-CM | POA: Diagnosis not present

## 2017-04-21 ENCOUNTER — Other Ambulatory Visit (HOSPITAL_COMMUNITY): Payer: Self-pay | Admitting: Respiratory Therapy

## 2017-04-21 DIAGNOSIS — R0602 Shortness of breath: Secondary | ICD-10-CM

## 2017-04-22 ENCOUNTER — Ambulatory Visit (HOSPITAL_COMMUNITY)
Admission: RE | Admit: 2017-04-22 | Discharge: 2017-04-22 | Disposition: A | Discharge status: Home / Self Care | Payer: Medicare Other | Source: Ambulatory Visit | Attending: Pulmonary Disease | Admitting: Pulmonary Disease

## 2017-04-22 DIAGNOSIS — R0602 Shortness of breath: Secondary | ICD-10-CM

## 2017-04-22 LAB — PULMONARY FUNCTION TEST
DL/VA % pred: 71 %
DL/VA: 3.59 ml/min/mmHg/L
DLCO unc % pred: 56 %
DLCO unc: 15.11 ml/min/mmHg
FEF 25-75 Post: 2.1 L/sec
FEF 25-75 Pre: 1.51 L/sec
FEF2575-%Change-Post: 38 %
FEF2575-%Pred-Post: 107 %
FEF2575-%Pred-Pre: 77 %
FEV1-%Change-Post: 6 %
FEV1-%Pred-Post: 94 %
FEV1-%Pred-Pre: 88 %
FEV1-Post: 2.3 L
FEV1-Pre: 2.15 L
FEV1FVC-%Change-Post: 6 %
FEV1FVC-%Pred-Pre: 96 %
FEV6-%Change-Post: 1 %
FEV6-%Pred-Post: 95 %
FEV6-%Pred-Pre: 94 %
FEV6-Post: 2.92 L
FEV6-Pre: 2.88 L
FEV6FVC-%Change-Post: 1 %
FEV6FVC-%Pred-Post: 105 %
FEV6FVC-%Pred-Pre: 104 %
FVC-%Change-Post: 0 %
FVC-%Pred-Post: 91 %
FVC-%Pred-Pre: 91 %
FVC-Post: 2.92 L
FVC-Pre: 2.92 L
Post FEV1/FVC ratio: 79 %
Post FEV6/FVC ratio: 100 %
Pre FEV1/FVC ratio: 74 %
Pre FEV6/FVC Ratio: 99 %
RV % pred: 140 %
RV: 3.25 L
TLC % pred: 114 %
TLC: 6.13 L

## 2017-04-22 MED ORDER — ALBUTEROL SULFATE (2.5 MG/3ML) 0.083% IN NEBU
2.5000 mg | INHALATION_SOLUTION | Freq: Once | RESPIRATORY_TRACT | Status: AC
  Administered 2017-04-22: 2.5 mg via RESPIRATORY_TRACT

## 2017-05-21 DIAGNOSIS — S30860A Insect bite (nonvenomous) of lower back and pelvis, initial encounter: Secondary | ICD-10-CM | POA: Diagnosis not present

## 2017-07-01 ENCOUNTER — Other Ambulatory Visit (HOSPITAL_COMMUNITY): Payer: Self-pay | Admitting: Internal Medicine

## 2017-07-01 DIAGNOSIS — Z1231 Encounter for screening mammogram for malignant neoplasm of breast: Secondary | ICD-10-CM

## 2017-07-08 ENCOUNTER — Ambulatory Visit (HOSPITAL_COMMUNITY)
Admission: RE | Admit: 2017-07-08 | Discharge: 2017-07-08 | Disposition: A | Discharge status: Home / Self Care | Payer: Medicare Other | Source: Ambulatory Visit | Attending: Internal Medicine | Admitting: Internal Medicine

## 2017-07-08 DIAGNOSIS — Z1231 Encounter for screening mammogram for malignant neoplasm of breast: Secondary | ICD-10-CM | POA: Diagnosis not present

## 2017-09-14 ENCOUNTER — Encounter (INDEPENDENT_AMBULATORY_CARE_PROVIDER_SITE_OTHER): Payer: Self-pay | Admitting: Internal Medicine

## 2017-09-14 ENCOUNTER — Ambulatory Visit (INDEPENDENT_AMBULATORY_CARE_PROVIDER_SITE_OTHER): Payer: Medicare Other | Admitting: Internal Medicine

## 2017-09-14 ENCOUNTER — Encounter (INDEPENDENT_AMBULATORY_CARE_PROVIDER_SITE_OTHER): Payer: Self-pay | Admitting: *Deleted

## 2017-09-14 VITALS — BP 130/82 | HR 76 | Temp 97.9°F | Ht 66.5 in | Wt 160.5 lb

## 2017-09-14 DIAGNOSIS — R1310 Dysphagia, unspecified: Secondary | ICD-10-CM | POA: Insufficient documentation

## 2017-09-14 DIAGNOSIS — R1319 Other dysphagia: Secondary | ICD-10-CM

## 2017-09-14 DIAGNOSIS — R140 Abdominal distension (gaseous): Secondary | ICD-10-CM

## 2017-09-14 DIAGNOSIS — K219 Gastro-esophageal reflux disease without esophagitis: Secondary | ICD-10-CM

## 2017-09-14 MED ORDER — PANTOPRAZOLE SODIUM 40 MG PO TBEC: 40.0000 mg | DELAYED_RELEASE_TABLET | Freq: Every day | ORAL | 3 refills | Status: DC

## 2017-09-14 NOTE — Progress Notes (Signed)
   Subjective:    Patient ID: WALTER MIN, female    DOB: 1945/05/30, 72 y.o.   MRN: [id number]  HPI referred by Dr Willey Blade for bloating.  She also c/o dysphagia. She has the sensation of food lodging.  She sometimes has vomiting after eating. She says everything she eats causes bloating. She has frequent GERD.  She says she sometimes eats late at night after bowling which makes her symptoms worse.    Symptoms for a couple of years, but worse in the last several monnths. Her appetite  Is good.  She says steak feels like it stops in her esophagus.  Has been on Zegered for years. Really not working now.  No weight loss.  Last EGD/ED in3/21/2012:  Small sliding hiatal hernia. No evidence of obvious stricture,ring, or web.  Last colonoscopy in 2011: External hemorrhoids, otherwise normal.  (Average risk)  Review of Systems Past Medical History:  Diagnosis Date  . Anemia    hx of Fe def many years ago  . Anxiety   . Arthritis    back  . GERD (gastroesophageal reflux disease)   . Hypertension     Past Surgical History:  Procedure Laterality Date  . ABDOMINAL HYSTERECTOMY     partial hysterectomy, ovaries remain  . BACK SURGERY    . FOOT SURGERY      Allergies  Allergen Reactions  . Codeine Other (See Comments)    Syncope   . Demerol Rash  . Feldene [Piroxicam] Rash  . Latex Rash  . Pyridium [Phenazopyridine Hcl] Itching and Rash  . Sulfur Rash    Current Outpatient Medications on File Prior to Visit  Medication Sig Dispense Refill  . atorvastatin (LIPITOR) 20 MG tablet Take 20 mg by mouth daily.    . cetirizine (ZYRTEC) 10 MG chewable tablet Chew 10 mg by mouth daily.    Marland Kitchen doxylamine, Sleep, (UNISOM) 25 MG tablet Take 25 mg by mouth at bedtime as needed.    Marland Kitchen estradiol (VIVELLE-DOT) 0.05 MG/24HR patch Place 1 patch onto the skin 2 (two) times a week.    . estradiol (VIVELLE-DOT) 0.05 MG/24HR Place 1 patch onto the skin every 14 (fourteen) days. Every 2 weeks    .  fluticasone (FLONASE) 50 MCG/ACT nasal spray Place 2 sprays into both nostrils as needed.     Earney Navy Bicarbonate (ZEGERID) 20-1100 MG CAPS Take 1 capsule by mouth daily before breakfast.     No current facility-administered medications on file prior to visit.         Objective:   Physical Exam Blood pressure 130/82, pulse 76, temperature 97.9 F (36.6 C), height 5' 6.5" (1.689 m), weight 160 lb 8 oz (72.8 kg). Alert and oriented. Skin warm and dry. Oral mucosa is moist.   . Sclera anicteric, conjunctivae is pink. Thyroid not enlarged. No cervical lymphadenopathy. Lungs clear. Heart regular rate and rhythm.  Abdomen is soft. Bowel sounds are positive. No hepatomegaly. No abdominal masses felt. No tenderness.  No edema to lower extremities.           Assessment & Plan:  GERD./bloating. Am going to switch her to Protonix daily. Dysphagia: EGD/ED. To rule out stricture.

## 2017-09-14 NOTE — Patient Instructions (Signed)
EGD/ED. The risks of bleeding, perforation and infection were reviewed with patient.  

## 2017-10-02 DIAGNOSIS — Z79899 Other long term (current) drug therapy: Secondary | ICD-10-CM | POA: Diagnosis not present

## 2017-10-02 DIAGNOSIS — M1990 Unspecified osteoarthritis, unspecified site: Secondary | ICD-10-CM | POA: Diagnosis not present

## 2017-10-02 DIAGNOSIS — I10 Essential (primary) hypertension: Secondary | ICD-10-CM | POA: Diagnosis not present

## 2017-10-02 DIAGNOSIS — E785 Hyperlipidemia, unspecified: Secondary | ICD-10-CM | POA: Diagnosis not present

## 2017-10-12 DIAGNOSIS — Z23 Encounter for immunization: Secondary | ICD-10-CM | POA: Diagnosis not present

## 2017-10-12 DIAGNOSIS — I129 Hypertensive chronic kidney disease with stage 1 through stage 4 chronic kidney disease, or unspecified chronic kidney disease: Secondary | ICD-10-CM | POA: Diagnosis not present

## 2017-10-12 DIAGNOSIS — N183 Chronic kidney disease, stage 3 (moderate): Secondary | ICD-10-CM | POA: Diagnosis not present

## 2017-10-12 DIAGNOSIS — R0602 Shortness of breath: Secondary | ICD-10-CM | POA: Diagnosis not present

## 2017-11-17 ENCOUNTER — Ambulatory Visit (HOSPITAL_COMMUNITY)
Admission: RE | Admit: 2017-11-17 | Discharge: 2017-11-17 | Disposition: A | Discharge status: Home / Self Care | Payer: Medicare Other | Source: Ambulatory Visit | Attending: Internal Medicine | Admitting: Internal Medicine

## 2017-11-17 ENCOUNTER — Other Ambulatory Visit: Payer: Self-pay

## 2017-11-17 ENCOUNTER — Encounter (HOSPITAL_COMMUNITY)
Admission: RE | Disposition: A | Discharge status: Home / Self Care | Payer: Self-pay | Source: Ambulatory Visit | Attending: Internal Medicine

## 2017-11-17 ENCOUNTER — Encounter (HOSPITAL_COMMUNITY): Payer: Self-pay | Admitting: *Deleted

## 2017-11-17 DIAGNOSIS — R1310 Dysphagia, unspecified: Secondary | ICD-10-CM | POA: Diagnosis present

## 2017-11-17 DIAGNOSIS — K449 Diaphragmatic hernia without obstruction or gangrene: Secondary | ICD-10-CM | POA: Insufficient documentation

## 2017-11-17 DIAGNOSIS — Z79899 Other long term (current) drug therapy: Secondary | ICD-10-CM | POA: Insufficient documentation

## 2017-11-17 DIAGNOSIS — I10 Essential (primary) hypertension: Secondary | ICD-10-CM | POA: Insufficient documentation

## 2017-11-17 DIAGNOSIS — K219 Gastro-esophageal reflux disease without esophagitis: Secondary | ICD-10-CM | POA: Diagnosis not present

## 2017-11-17 DIAGNOSIS — R1319 Other dysphagia: Secondary | ICD-10-CM

## 2017-11-17 DIAGNOSIS — K222 Esophageal obstruction: Secondary | ICD-10-CM | POA: Diagnosis not present

## 2017-11-17 DIAGNOSIS — R1314 Dysphagia, pharyngoesophageal phase: Secondary | ICD-10-CM | POA: Diagnosis not present

## 2017-11-17 HISTORY — DX: Nausea with vomiting, unspecified: R11.2

## 2017-11-17 HISTORY — DX: Other specified postprocedural states: Z98.890

## 2017-11-17 HISTORY — PX: ESOPHAGOGASTRODUODENOSCOPY: SHX5428

## 2017-11-17 HISTORY — PX: ESOPHAGEAL DILATION: SHX303

## 2017-11-17 SURGERY — EGD (ESOPHAGOGASTRODUODENOSCOPY)
Anesthesia: Moderate Sedation

## 2017-11-17 MED ORDER — FENTANYL CITRATE (PF) 100 MCG/2ML IJ SOLN
INTRAMUSCULAR | Status: DC | PRN
  Administered 2017-11-17 (×3): 25 ug via INTRAVENOUS

## 2017-11-17 MED ORDER — MIDAZOLAM HCL 5 MG/5ML IJ SOLN
INTRAMUSCULAR | Status: DC | PRN
  Administered 2017-11-17: 1 mg via INTRAVENOUS
  Administered 2017-11-17 (×3): 2 mg via INTRAVENOUS

## 2017-11-17 MED ORDER — SODIUM CHLORIDE 0.9 % IV SOLN
INTRAVENOUS | Status: DC
  Administered 2017-11-17: 1000 mL via INTRAVENOUS

## 2017-11-17 MED ORDER — LIDOCAINE VISCOUS HCL 2 % MT SOLN
OROMUCOSAL | Status: DC | PRN
  Administered 2017-11-17: 1 via OROMUCOSAL

## 2017-11-17 MED ORDER — FAMOTIDINE 20 MG PO TABS: 20.0000 mg | ORAL_TABLET | Freq: Every evening | ORAL | Status: DC | PRN

## 2017-11-17 MED ORDER — MIDAZOLAM HCL 5 MG/5ML IJ SOLN
INTRAMUSCULAR | Status: AC
  Filled 2017-11-17: qty 10

## 2017-11-17 MED ORDER — STERILE WATER FOR IRRIGATION IR SOLN
Status: DC | PRN
  Administered 2017-11-17: 1.5 mL

## 2017-11-17 MED ORDER — FENTANYL CITRATE (PF) 100 MCG/2ML IJ SOLN
INTRAMUSCULAR | Status: AC
  Filled 2017-11-17: qty 2

## 2017-11-17 MED ORDER — LIDOCAINE VISCOUS HCL 2 % MT SOLN
OROMUCOSAL | Status: AC
  Filled 2017-11-17: qty 15

## 2017-11-17 NOTE — Op Note (Signed)
[facility] Patient Name: Laurie Bridges Procedure Date: 11/17/2017 10:39 AM MRN: [id number] Date of Birth: 24-Oct-1945 Attending MD: Hildred Laser , MD CSN: [id number] Age: 72 Admit Type: Outpatient Procedure:                Upper GI endoscopy Indications:              Esophageal dysphagia Providers:                Hildred Laser, MD, Lurline Del, RN, Randa Spike,                            Technician Referring MD:             Asencion Noble, MD Medicines:                Lidocaine spray, Fentanyl 75 micrograms IV,                            Midazolam 7 mg IV Complications:            No immediate complications. Estimated Blood Loss:     Estimated blood loss was minimal. Procedure:                Pre-Anesthesia Assessment:                           - Prior to the procedure, a History and Physical                            was performed, and patient medications and                            allergies were reviewed. The patient's tolerance of                            previous anesthesia was also reviewed. The risks                            and benefits of the procedure and the sedation                            options and risks were discussed with the patient.                            All questions were answered, and informed consent                            was obtained. Prior Anticoagulants: The patient has                            taken no previous anticoagulant or antiplatelet                            agents. ASA Grade Assessment: II - A patient with  mild systemic disease. After reviewing the risks                            and benefits, the patient was deemed in                            satisfactory condition to undergo the procedure.                           After obtaining informed consent, the endoscope was                            passed under direct vision. Throughout the                            procedure, the patient's  blood pressure, pulse, and                            oxygen saturations were monitored continuously. The                            GIF-H190 ([id number]) was introduced through the                            mouth, and advanced to the second part of duodenum.                            The upper GI endoscopy was accomplished without                            difficulty. The patient tolerated the procedure                            well. The upper GI endoscopy was accomplished                            without difficulty. The patient tolerated the                            procedure well. Scope In: 11:18:40 AM Scope Out: 11:34:24 AM Total Procedure Duration: 0 hours 15 minutes 44 seconds  Findings:      The proximal esophagus and mid esophagus were normal.      One benign-appearing, intrinsic mild stenosis was found 35 to 36 cm from       the incisors. The stenosis was traversed. The scope was withdrawn.       Dilation performed with 50 Fr. Maloney dilator but no disruption noted.       Repeat dilation was performed with a Maloney dilator with mild       resistance at 73 Fr. The dilation site was examined following endoscope       reinsertion and showed mild mucosal disruption, moderate improvement in       luminal narrowing and no perforation.      The Z-line was regular and was found 36 cm from the incisors.  A 3 cm hiatal hernia was present.      The entire examined stomach was normal.      The duodenal bulb and second portion of the duodenum were normal. Impression:               - Normal proximal esophagus and mid esophagus.                           - Benign-appearing mild esophageal stenosis proxima                            to GEJ. Dilated.                           - Z-line regular, 36 cm from the incisors.                           - 3 cm hiatal hernia.                           - Normal stomach.                           - Normal duodenal bulb and second portion of the                             duodenum.                           - No specimens collected. Moderate Sedation:      Moderate (conscious) sedation was administered by the endoscopy nurse       and supervised by the endoscopist. The following parameters were       monitored: oxygen saturation, heart rate, blood pressure, CO2       capnography and response to care. Total physician intraservice time was       25 minutes. Recommendation:           - Patient has a contact number available for                            emergencies. The signs and symptoms of potential                            delayed complications were discussed with the                            patient. Return to normal activities tomorrow.                            Written discharge instructions were provided to the                            patient.                           - Resume previous diet today.                           -  Continue present medications.                           - Famotidine OTC 20 mg po qhs prn.                           - Telephone GI clinic in 1 week. Procedure Code(s):        --- Professional ---                           [phone], Esophagogastroduodenoscopy, flexible,                            transoral; diagnostic, including collection of                            specimen(s) by brushing or washing, when performed                            (separate procedure)                           43450, Dilation of esophagus, by unguided sound or                            bougie, single or multiple passes                           99153, Moderate sedation; each additional 15                            minutes intraservice time                           G0500, Moderate sedation services provided by the                            same physician or other qualified health care                            professional performing a gastrointestinal                            endoscopic service that  sedation supports,                            requiring the presence of an independent trained                            observer to assist in the monitoring of the                            patient's level of consciousness and physiological                            status; initial 15 minutes of intra-service  time;                            patient age 9 years or older (additional time may                            be reported with [phone], as appropriate) Diagnosis Code(s):        --- Professional ---                           K22.2, Esophageal obstruction                           K44.9, Diaphragmatic hernia without obstruction or                            gangrene                           R13.14, Dysphagia, pharyngoesophageal phase CPT copyright 2018 American Medical [employer]. All rights reserved. The codes documented in this report are preliminary and upon coder review may  be revised to meet current compliance requirements. Hildred Laser, MD Hildred Laser, MD 11/17/2017 11:47:05 AM This report has been signed electronically. Number of Addenda: 0

## 2017-11-17 NOTE — Discharge Instructions (Signed)
No aspirin or NSAIDs for 24 hours. Resume usual diet. No driving for 24 hours. Please call office with progress report in 1 week.      Esophagogastroduodenoscopy, Care After Refer to this sheet in the next few weeks. These instructions provide you with information about caring for yourself after your procedure. Your health care provider may also give you more specific instructions. Your treatment has been planned according to current medical practices, but problems sometimes occur. Call your health care provider if you have any problems or questions after your procedure. What can I expect after the procedure? After the procedure, it is common to have:  A sore throat.  Nausea.  Bloating.  Dizziness.  Fatigue.  Follow these instructions at home:  Do not eat or drink anything until the numbing medicine (local anesthetic) has worn off and your gag reflex has returned. You will know that the local anesthetic has worn off when you can swallow comfortably.  Do not drive for 24 hours if you received a medicine to help you relax (sedative).  If your health care provider took a tissue sample for testing during the procedure, make sure to get your test results. This is your responsibility. Ask your health care provider or the department performing the test when your results will be ready.  Keep all follow-up visits as told by your health care provider. This is important. Contact a health care provider if:  You cannot stop coughing.  You are not urinating.  You are urinating less than usual. Get help right away if:  You have trouble swallowing.  You cannot eat or drink.  You have throat or chest pain that gets worse.  You are dizzy or light-headed.  You faint.  You have nausea or vomiting.  You have chills.  You have a fever.  You have severe abdominal pain.  You have black, tarry, or bloody stools. This information is not intended to replace advice given to you by  your health care provider. Make sure you discuss any questions you have with your health care provider. Document Released: 12/16/2011 Document Revised: 06/06/2015 Document Reviewed: 11/22/2014 Elsevier Interactive Patient Education  [employer].

## 2017-11-17 NOTE — H&P (Signed)
Laurie Bridges is an 72 y.o. female.   Chief Complaint: Patient is here for EGD and ED. HPI: Patient is 72 year old Caucasian female who has chronic GERD who presents with 29-month history of solid food dysphagia.  She is at least 4-5 episodes of food impaction relieved with regurgitation.  She points to the suprasternal area sort of bolus obstruction.  She has undergone dilation twice in the past.  Initially in 1998 and more recently March 2012 when she was felt to have subtle distal esophageal stricture. She denies nausea vomiting epigastric pain weight loss or melena.  Past Medical History:  Diagnosis Date  . Anemia    hx of Fe def many years ago  . Anxiety   . Arthritis    back  . GERD (gastroesophageal reflux disease)   . Hypertension   . PONV (postoperative nausea and vomiting)     Past Surgical History:  Procedure Laterality Date  . ABDOMINAL HYSTERECTOMY     partial hysterectomy, ovaries remain  . BACK SURGERY    . FOOT SURGERY      Family History  Problem Relation Age of Onset  . Stroke Mother   . Stroke Father   . Cancer Maternal Uncle   . Cancer Paternal Uncle   . Cancer Paternal Uncle    Social History:  reports that she has never smoked. She has never used smokeless tobacco. She reports that she does not drink alcohol or use drugs.  Allergies:  Allergies  Allergen Reactions  . Codeine Other (See Comments)    Syncope   . Demerol Rash  . Feldene [Piroxicam] Rash  . Latex Rash  . Pyridium [Phenazopyridine Hcl] Itching and Rash  . Sulfur Rash    Medications Prior to Admission  Medication Sig Dispense Refill  . atorvastatin (LIPITOR) 20 MG tablet Take 20 mg by mouth daily.    . cetirizine (ZYRTEC) 10 MG tablet Take 10 mg by mouth daily.     Marland Kitchen doxylamine, Sleep, (UNISOM) 25 MG tablet Take 50 mg by mouth at bedtime.     Earney Navy Bicarbonate (ZEGERID OTC) 20-1100 MG CAPS capsule Take 1 capsule by mouth daily before breakfast.    . pantoprazole  (PROTONIX) 40 MG tablet Take 1 tablet (40 mg total) by mouth daily. (Patient not taking: Reported on 11/11/2017) 90 tablet 3    No results found for this or any previous visit (from the past 48 hour(s)). No results found.  ROS  Blood pressure (!) 161/87, pulse 71, temperature 97.6 F (36.4 C), temperature source Oral, resp. rate 18, height 5' 6.5" (1.689 m), weight 71.7 kg, SpO2 100 %. Physical Exam  Constitutional: She appears well-developed and well-nourished.  HENT:  Mouth/Throat: Oropharynx is clear and moist.  Eyes: Conjunctivae are normal. No scleral icterus.  Neck: No thyromegaly present.  Cardiovascular: Normal rate, regular rhythm and normal heart sounds.  No murmur heard. Respiratory: Effort normal and breath sounds normal.  GI: Soft. She exhibits no distension and no mass. There is no tenderness.  Musculoskeletal: She exhibits no edema.  Lymphadenopathy:    She has no cervical adenopathy.  Neurological: She is alert.  Skin: Skin is warm and dry.     Assessment/Plan Solid food dysphagia and patient with chronic GERD and history of esophageal stricture. EGD with ED.  Hildred Laser, MD 11/17/2017, 11:09 AM

## 2017-11-24 ENCOUNTER — Encounter (HOSPITAL_COMMUNITY): Payer: Self-pay | Admitting: Internal Medicine

## 2017-11-25 ENCOUNTER — Telehealth (INDEPENDENT_AMBULATORY_CARE_PROVIDER_SITE_OTHER): Payer: Self-pay | Admitting: *Deleted

## 2017-11-25 ENCOUNTER — Other Ambulatory Visit (INDEPENDENT_AMBULATORY_CARE_PROVIDER_SITE_OTHER): Payer: Self-pay | Admitting: *Deleted

## 2017-11-25 DIAGNOSIS — R112 Nausea with vomiting, unspecified: Secondary | ICD-10-CM

## 2017-11-25 DIAGNOSIS — R110 Nausea: Secondary | ICD-10-CM

## 2017-11-25 MED ORDER — ONDANSETRON HCL 4 MG PO TABS: 4.0000 mg | ORAL_TABLET | Freq: Three times a day (TID) | ORAL | 0 refills | Status: DC | PRN

## 2017-11-25 NOTE — Progress Notes (Signed)
Upper

## 2017-11-25 NOTE — Telephone Encounter (Signed)
[place] sch'd 12/02/17 at 830 (815), npo after midnight, patient aware

## 2017-11-25 NOTE — Telephone Encounter (Signed)
Per Dr.Rehman the patient will need to have  [place] RUQ . Patient was called and made aware.

## 2017-12-02 ENCOUNTER — Other Ambulatory Visit (INDEPENDENT_AMBULATORY_CARE_PROVIDER_SITE_OTHER): Payer: Self-pay | Admitting: *Deleted

## 2017-12-02 ENCOUNTER — Ambulatory Visit (HOSPITAL_COMMUNITY)
Admission: RE | Admit: 2017-12-02 | Discharge: 2017-12-02 | Disposition: A | Discharge status: Home / Self Care | Payer: Medicare Other | Source: Ambulatory Visit | Attending: Internal Medicine | Admitting: Internal Medicine

## 2017-12-02 ENCOUNTER — Other Ambulatory Visit (INDEPENDENT_AMBULATORY_CARE_PROVIDER_SITE_OTHER): Payer: Self-pay | Admitting: Internal Medicine

## 2017-12-02 DIAGNOSIS — N281 Cyst of kidney, acquired: Secondary | ICD-10-CM

## 2017-12-02 DIAGNOSIS — K769 Liver disease, unspecified: Secondary | ICD-10-CM | POA: Diagnosis not present

## 2017-12-02 DIAGNOSIS — K8020 Calculus of gallbladder without cholecystitis without obstruction: Secondary | ICD-10-CM | POA: Insufficient documentation

## 2017-12-02 DIAGNOSIS — R112 Nausea with vomiting, unspecified: Secondary | ICD-10-CM | POA: Diagnosis present

## 2017-12-06 ENCOUNTER — Other Ambulatory Visit (INDEPENDENT_AMBULATORY_CARE_PROVIDER_SITE_OTHER): Payer: Self-pay | Admitting: *Deleted

## 2017-12-06 DIAGNOSIS — N281 Cyst of kidney, acquired: Secondary | ICD-10-CM

## 2017-12-06 NOTE — Progress Notes (Signed)
C-

## 2017-12-07 ENCOUNTER — Ambulatory Visit (HOSPITAL_COMMUNITY)
Admission: RE | Admit: 2017-12-07 | Discharge: 2017-12-07 | Disposition: A | Discharge status: Home / Self Care | Payer: Medicare Other | Source: Ambulatory Visit | Attending: Internal Medicine | Admitting: Internal Medicine

## 2017-12-07 ENCOUNTER — Ambulatory Visit (HOSPITAL_COMMUNITY): Payer: Medicare Other

## 2017-12-07 DIAGNOSIS — K8020 Calculus of gallbladder without cholecystitis without obstruction: Secondary | ICD-10-CM | POA: Diagnosis not present

## 2017-12-07 DIAGNOSIS — N281 Cyst of kidney, acquired: Secondary | ICD-10-CM | POA: Insufficient documentation

## 2017-12-07 LAB — POCT I-STAT CREATININE: Creatinine, Ser: 1.1 mg/dL — ABNORMAL HIGH (ref 0.44–1.00)

## 2017-12-07 MED ORDER — IOPAMIDOL (ISOVUE-300) INJECTION 61%
100.0000 mL | Freq: Once | INTRAVENOUS | Status: AC | PRN
  Administered 2017-12-07: 100 mL via INTRAVENOUS

## 2017-12-23 ENCOUNTER — Other Ambulatory Visit (INDEPENDENT_AMBULATORY_CARE_PROVIDER_SITE_OTHER): Payer: Self-pay | Admitting: *Deleted

## 2017-12-23 DIAGNOSIS — R112 Nausea with vomiting, unspecified: Secondary | ICD-10-CM

## 2017-12-23 MED ORDER — ONDANSETRON HCL 4 MG PO TABS: 4.0000 mg | ORAL_TABLET | Freq: Three times a day (TID) | ORAL | 1 refills | Status: DC | PRN

## 2018-01-27 ENCOUNTER — Other Ambulatory Visit (INDEPENDENT_AMBULATORY_CARE_PROVIDER_SITE_OTHER): Payer: Self-pay | Admitting: Internal Medicine

## 2018-01-27 DIAGNOSIS — R112 Nausea with vomiting, unspecified: Secondary | ICD-10-CM

## 2018-02-14 DIAGNOSIS — M5136 Other intervertebral disc degeneration, lumbar region: Secondary | ICD-10-CM | POA: Diagnosis not present

## 2018-02-14 DIAGNOSIS — M1612 Unilateral primary osteoarthritis, left hip: Secondary | ICD-10-CM | POA: Diagnosis not present

## 2018-03-28 DIAGNOSIS — E785 Hyperlipidemia, unspecified: Secondary | ICD-10-CM | POA: Diagnosis not present

## 2018-03-28 DIAGNOSIS — Z79899 Other long term (current) drug therapy: Secondary | ICD-10-CM | POA: Diagnosis not present

## 2018-03-28 DIAGNOSIS — M1990 Unspecified osteoarthritis, unspecified site: Secondary | ICD-10-CM | POA: Diagnosis not present

## 2018-03-28 DIAGNOSIS — I10 Essential (primary) hypertension: Secondary | ICD-10-CM | POA: Diagnosis not present

## 2018-03-28 DIAGNOSIS — K219 Gastro-esophageal reflux disease without esophagitis: Secondary | ICD-10-CM | POA: Diagnosis not present

## 2018-03-29 ENCOUNTER — Other Ambulatory Visit: Payer: Self-pay

## 2018-03-29 ENCOUNTER — Encounter: Payer: Self-pay | Admitting: Cardiovascular Disease

## 2018-03-29 ENCOUNTER — Ambulatory Visit (INDEPENDENT_AMBULATORY_CARE_PROVIDER_SITE_OTHER): Payer: Medicare Other | Admitting: Cardiovascular Disease

## 2018-03-29 VITALS — BP 136/76 | HR 85 | Ht 66.5 in | Wt 159.0 lb

## 2018-03-29 DIAGNOSIS — J301 Allergic rhinitis due to pollen: Secondary | ICD-10-CM | POA: Diagnosis not present

## 2018-03-29 DIAGNOSIS — R0609 Other forms of dyspnea: Secondary | ICD-10-CM

## 2018-03-29 DIAGNOSIS — I10 Essential (primary) hypertension: Secondary | ICD-10-CM

## 2018-03-29 DIAGNOSIS — I209 Angina pectoris, unspecified: Secondary | ICD-10-CM

## 2018-03-29 DIAGNOSIS — R0600 Dyspnea, unspecified: Secondary | ICD-10-CM

## 2018-03-29 DIAGNOSIS — R079 Chest pain, unspecified: Secondary | ICD-10-CM

## 2018-03-29 DIAGNOSIS — I340 Nonrheumatic mitral (valve) insufficiency: Secondary | ICD-10-CM

## 2018-03-29 MED ORDER — LEVOCETIRIZINE DIHYDROCHLORIDE 5 MG PO TABS: 2.5000 mg | ORAL_TABLET | Freq: Every evening | ORAL | 3 refills | Status: DC

## 2018-03-29 MED ORDER — METOPROLOL TARTRATE 100 MG PO TABS: ORAL_TABLET | ORAL | 0 refills | Status: DC

## 2018-03-29 NOTE — Patient Instructions (Signed)
Medication Instructions: Take Xyzal 2.5 mg every evening for allergies  Labwork: none  Procedures/Testing: Cardiac CT, instruction sheet given  Your physician has requested that you have an echocardiogram. Echocardiography is a painless test that uses sound waves to create images of your heart. It provides your doctor with information about the size and shape of your heart and how well your heart's chambers and valves are working. This procedure takes approximately one hour. There are no restrictions for this procedure.   Follow-Up: 3 months with Dr Bronson Ing  Any Additional Special Instructions Will Be Listed Below (If Applicable).     If you need a refill on your cardiac medications before your next appointment, please call your pharmacy.

## 2018-03-29 NOTE — Progress Notes (Signed)
CARDIOLOGY CONSULT NOTE  Patient ID: Laurie Bridges MRN: [id number] DOB/AGE: Jun 28, 1945 73 y.o.  Admit date: (Not on file) Primary Physician: Asencion Noble, MD Referring Physician: Asencion Noble, MD  Reason for Consultation: Shortness of breath and fatigue  HPI: Laurie Bridges is a 73 y.o. female who is being seen today for the evaluation of shortness of breath and fatigue at the request of Asencion Noble, MD.   She was last evaluated by cardiology in July 2015 and at that time underwent an echocardiogram.  This was performed on 08/03/2013 which demonstrated normal left ventricular systolic function, LVEF 55 to 60%, normal regional wall motion, grade 1 diastolic dysfunction, and mild mitral regurgitation.  Most recent labs on 10/02/2017 showed BUN 12, sodium 141, potassium 4.6.  Creatinine 1.1 on 12/07/2017.  I personally reviewed an ECG performed on 03/26/2017 which showed normal sinus rhythm with no ischemic abnormalities.  ECG performed in the office today which I ordered and personally interpreted demonstrates normal sinus rhythm with no ischemic ST segment or T-wave abnormalities, nor any arrhythmias.  RSR prime pattern noted in V1.  She has been experiencing intermittent exertional dyspnea.  It sometimes affects her when she is walking up and down the basement stairs.  It can also occur when she is walking to and from her mailbox.  She has mild occasional chest pains.  She denies any change in baseline energy levels over the past 6 months.  She also denies palpitations, leg swelling, orthopnea, and paroxysmal nocturnal dyspnea.  She has seasonal allergies and has had sinus drainage and "bronchial issues ".  She is tried Barista without relief.  Last week she had some chest pain which radiated up the right side of her neck to behind her right eye.  She said her eye was sore for 2 days.  This occurred while she had been sitting.  She has had 2 surgeries for lumbar  degenerative disc disease.  She has occasional left groin pains.  Family history: Mother had a stroke at age 5.  She had an MI later in life.  Her father had a silent MI.  Social history: She has been bowling since she was a teenager and bowls in 3 different leagues.  Her mother bowled and her son and grandson also bowl.      Allergies  Allergen Reactions  . Codeine Other (See Comments)    Syncope   . Demerol Rash  . Feldene [Piroxicam] Rash  . Latex Rash  . Pyridium [Phenazopyridine Hcl] Itching and Rash  . Sulfur Rash    Current Outpatient Medications  Medication Sig Dispense Refill  . atorvastatin (LIPITOR) 20 MG tablet Take 20 mg by mouth daily.    . cetirizine (ZYRTEC) 10 MG tablet Take 10 mg by mouth daily.     Marland Kitchen doxylamine, Sleep, (UNISOM) 25 MG tablet Take 50 mg by mouth at bedtime.     . famotidine (PEPCID) 20 MG tablet Take 1 tablet (20 mg total) by mouth at bedtime as needed for heartburn or indigestion.    Earney Navy Bicarbonate (ZEGERID OTC) 20-1100 MG CAPS capsule Take 1 capsule by mouth daily before breakfast.    . ondansetron (ZOFRAN) 4 MG tablet TAKE (1) TABLET BY MOUTH (3) TIMES DAILY AS NEEDED FOR NAUSEA/VOMITING. 30 tablet 1   No current facility-administered medications for this visit.     Past Medical History:  Diagnosis Date  . Anemia    hx of Fe  def many years ago  . Anxiety   . Arthritis    back  . GERD (gastroesophageal reflux disease)   . Hypertension   . PONV (postoperative nausea and vomiting)     Past Surgical History:  Procedure Laterality Date  . ABDOMINAL HYSTERECTOMY     partial hysterectomy, ovaries remain  . BACK SURGERY    . ESOPHAGEAL DILATION N/A 11/17/2017   Procedure: ESOPHAGEAL DILATION;  Surgeon: Rogene Houston, MD;  Location: [facility];  Service: Endoscopy;  Laterality: N/A;  . ESOPHAGOGASTRODUODENOSCOPY N/A 11/17/2017   Procedure: ESOPHAGOGASTRODUODENOSCOPY (EGD);  Surgeon: Rogene Houston, MD;   Location: [facility];  Service: Endoscopy;  Laterality: N/A;  12:00  . FOOT SURGERY      Social History   Socioeconomic History  . Marital status: Widowed    Spouse name: Not on file  . Number of children: Not on file  . Years of education: Not on file  . Highest education level: Not on file  Occupational History  . Not on file  Social Needs  . Financial resource strain: Not on file  . Food insecurity:    Worry: Not on file    Inability: Not on file  . Transportation needs:    Medical: Not on file    Non-medical: Not on file  Tobacco Use  . Smoking status: Never Smoker  . Smokeless tobacco: Never Used  Substance and Sexual Activity  . Alcohol use: No  . Drug use: No  . Sexual activity: Never  Lifestyle  . Physical activity:    Days per week: Not on file    Minutes per session: Not on file  . Stress: Not on file  Relationships  . Social connections:    Talks on phone: Not on file    Gets together: Not on file    Attends religious service: Not on file    Active member of club or organization: Not on file    Attends meetings of clubs or organizations: Not on file    Relationship status: Not on file  . Intimate partner violence:    Fear of current or ex partner: Not on file    Emotionally abused: Not on file    Physically abused: Not on file    Forced sexual activity: Not on file  Other Topics Concern  . Not on file  Social History Narrative  . Not on file      Current Meds  Medication Sig  . atorvastatin (LIPITOR) 20 MG tablet Take 20 mg by mouth daily.  . cetirizine (ZYRTEC) 10 MG tablet Take 10 mg by mouth daily.   Marland Kitchen doxylamine, Sleep, (UNISOM) 25 MG tablet Take 50 mg by mouth at bedtime.   . famotidine (PEPCID) 20 MG tablet Take 1 tablet (20 mg total) by mouth at bedtime as needed for heartburn or indigestion.  Earney Navy Bicarbonate (ZEGERID OTC) 20-1100 MG CAPS capsule Take 1 capsule by mouth daily before breakfast.  . ondansetron (ZOFRAN) 4  MG tablet TAKE (1) TABLET BY MOUTH (3) TIMES DAILY AS NEEDED FOR NAUSEA/VOMITING.      Review of systems complete and found to be negative unless listed above in HPI    Physical exam Blood pressure 136/76, pulse 85, height 5' 6.5" (1.689 m), weight 159 lb (72.1 kg), SpO2 98 %. General: NAD Neck: No JVD, no thyromegaly or thyroid nodule.  Lungs: Clear to auscultation bilaterally with normal respiratory effort. CV: Nondisplaced PMI. Regular rate and rhythm, normal  S1/S2, no S3/S4, no murmur.  No peripheral edema.  No carotid bruit.    Abdomen: Soft, nontender, no distention.  Skin: Intact without lesions or rashes.  Neurologic: Alert and oriented x 3.  Psych: Normal affect. Extremities: No clubbing or cyanosis.  HEENT: Normal.   ECG: Most recent ECG reviewed.   Labs: Lab Results  Component Value Date/Time   K 4.2 07/27/2012 03:36 PM   BUN 13 07/27/2012 03:36 PM   CREATININE 1.10 (H) 12/07/2017 09:41 AM   ALT 23 07/27/2012 03:36 PM   TSH 1.109 08/02/2013 11:53 AM   HGB 13.8 07/27/2012 03:36 PM     Lipids: No results found for: LDLCALC, LDLDIRECT, CHOL, TRIG, HDL      ASSESSMENT AND PLAN:   1.  Dyspnea on exertion: She has no history of smoking or pulmonary disease.  Shortness of breath is worse on days when seasonal allergies are worse.  In order to rule out obstructive coronary artery disease, I will obtain coronary CT angiography. I will order a 2-D echocardiogram with Doppler to evaluate cardiac structure, function, and regional wall motion.  2.  Hypertension: Blood pressure is presently reasonable without antihypertensive therapy.  She had been tried on higher doses of verapamil in the past which led to fatigue.  3.  Mitral regurgitation: Mild in severity by echocardiogram in July 2015.  No appreciable murmur on physical exam.  I will obtain an echocardiogram to evaluate for interval changes in cardiac structure and function.  4.  Seasonal allergies: She does not take  Zyrtec as it provided no relief and she has tried Allegra without relief in the past.  I will prescribe Xyzal 2.5 mg every evening.    Disposition: Follow up in 3 months  Signed: Kate Sable, M.D., F.A.C.C.  03/29/2018, 9:20 AM

## 2018-04-08 DIAGNOSIS — I700 Atherosclerosis of aorta: Secondary | ICD-10-CM | POA: Diagnosis not present

## 2018-04-08 DIAGNOSIS — E785 Hyperlipidemia, unspecified: Secondary | ICD-10-CM | POA: Diagnosis not present

## 2018-04-08 DIAGNOSIS — R0609 Other forms of dyspnea: Secondary | ICD-10-CM | POA: Diagnosis not present

## 2018-04-15 ENCOUNTER — Other Ambulatory Visit (HOSPITAL_COMMUNITY): Payer: Medicare Other

## 2018-05-25 ENCOUNTER — Telehealth: Payer: Self-pay | Admitting: Adult Health

## 2018-05-25 NOTE — Telephone Encounter (Signed)
Spoke with patient she states that she has a red external rash on her labia and entire bottom area. She is having a lot of itching with it. She denies any vaginal discharge or odor. Advised patient that I will send message to Laurie Bridges to see if she needs OV or can be handled with rx. Patient agreeable.

## 2018-05-25 NOTE — Telephone Encounter (Signed)
Pt states that she has a rash on her vaginal area and is requesting an appt with Anderson Malta.

## 2018-06-08 ENCOUNTER — Ambulatory Visit (HOSPITAL_COMMUNITY)
Admission: RE | Admit: 2018-06-08 | Discharge: 2018-06-08 | Disposition: A | Discharge status: Home / Self Care | Payer: Medicare Other | Source: Ambulatory Visit | Attending: Cardiovascular Disease | Admitting: Cardiovascular Disease

## 2018-06-08 ENCOUNTER — Other Ambulatory Visit: Payer: Self-pay

## 2018-06-08 DIAGNOSIS — I340 Nonrheumatic mitral (valve) insufficiency: Secondary | ICD-10-CM

## 2018-06-08 NOTE — Progress Notes (Signed)
*  PRELIMINARY RESULTS* Echocardiogram 2D Echocardiogram has been performed.  Samuel Germany 06/08/2018, 11:34 AM

## 2018-06-14 ENCOUNTER — Telehealth: Payer: Self-pay | Admitting: Cardiovascular Disease

## 2018-06-14 NOTE — Telephone Encounter (Signed)
Virtual Visit Pre-Appointment Phone Call  "(Name), I am calling you today to discuss your upcoming appointment. We are currently trying to limit exposure to the virus that causes COVID-19 by seeing patients at home rather than in the office."  1. "What is the BEST phone number to call the day of the visit?" -   [phone]   2. Do you have or have access to (through a family member/friend) a smartphone with video capability that we can use for your visit?" a. If yes - list this number in appt notes as cell (if different from BEST phone #) and list the appointment type as a VIDEO visit in appointment notes b. If no - list the appointment type as a PHONE visit in appointment notes  3. Confirm consent - "In the setting of the current Covid19 crisis, you are scheduled for a (phone or video) visit with your provider on (date) at (time).  Just as we do with many in-office visits, in order for you to participate in this visit, we must obtain consent.  If you'd like, I can send this to your mychart (if signed up) or email for you to review.  Otherwise, I can obtain your verbal consent now.  All virtual visits are billed to your insurance company just like a normal visit would be.  By agreeing to a virtual visit, we'd like you to understand that the technology does not allow for your provider to perform an examination, and thus may limit your provider's ability to fully assess your condition. If your provider identifies any concerns that need to be evaluated in person, we will make arrangements to do so.  Finally, though the technology is pretty good, we cannot assure that it will always work on either your or our end, and in the setting of a video visit, we may have to convert it to a phone-only visit.  In either situation, we cannot ensure that we have a secure connection.  Are you willing to proceed?" STAFF: Did the patient verbally acknowledge consent to telehealth visit? Document YES/NO here: yes       4. Advise patient to be prepared - "Two hours prior to your appointment, go ahead and check your blood pressure, pulse, oxygen saturation, and your weight (if you have the equipment to check those) and write them all down. When your visit starts, your provider will ask you for this information. If you have an Apple Watch or Kardia device, please plan to have heart rate information ready on the day of your appointment. Please have a pen and paper handy nearby the day of the visit as well."  5. Give patient instructions for MyChart download to smartphone OR Doximity/Doxy.me as below if video visit (depending on what platform provider is using)  6. Inform patient they will receive a phone call 15 minutes prior to their appointment time (may be from unknown caller ID) so they should be prepared to answer    TELEPHONE CALL NOTE  KELLEN HOVER has been deemed a candidate for a follow-up tele-health visit to limit community exposure during the Covid-19 pandemic. I spoke with the patient via phone to ensure availability of phone/video source, confirm preferred email & phone number, and discuss instructions and expectations.  I reminded Laurie Bridges to be prepared with any vital sign and/or heart rhythm information that could potentially be obtained via home monitoring, at the time of her visit. I reminded MARTINE BLEECKER to expect a phone  call prior to her visit.  Chanda Busing 06/14/2018 3:58 PM   INSTRUCTIONS FOR DOWNLOADING THE MYCHART APP TO SMARTPHONE  - The patient must first make sure to have activated MyChart and know their login information - If Apple, go to [employer] and type in MyChart in the search bar and download the app. If Android, ask patient to go to [employer] and type in [facility] in the search bar and download the app. The app is free but as with any other app downloads, their phone may require them to verify saved payment information or Apple/Android  password.  - The patient will need to then log into the app with their MyChart username and password, and select [facility] as their healthcare provider to link the account. When it is time for your visit, go to the MyChart app, find appointments, and click Begin Video Visit. Be sure to Select Allow for your device to access the Microphone and Camera for your visit. You will then be connected, and your provider will be with you shortly.  **If they have any issues connecting, or need assistance please contact MyChart service desk [phone])**  **If using a computer, in order to ensure the best quality for their visit they will need to use either of the following Internet Browsers: [employer], or Google Chrome**  IF USING DOXIMITY or DOXY.ME - The patient will receive a link just prior to their visit by text.     FULL LENGTH CONSENT FOR TELE-HEALTH VISIT   I hereby voluntarily request, consent and authorize [facility] and its employed or contracted physicians, physician assistants, nurse practitioners or other licensed health care professionals (the Practitioner), to provide me with telemedicine health care services (the Services") as deemed necessary by the treating Practitioner. I acknowledge and consent to receive the Services by the Practitioner via telemedicine. I understand that the telemedicine visit will involve communicating with the Practitioner through live audiovisual communication technology and the disclosure of certain medical information by electronic transmission. I acknowledge that I have been given the opportunity to request an in-person assessment or other available alternative prior to the telemedicine visit and am voluntarily participating in the telemedicine visit.  I understand that I have the right to withhold or withdraw my consent to the use of telemedicine in the course of my care at any time, without affecting my right to future care or treatment,  and that the Practitioner or I may terminate the telemedicine visit at any time. I understand that I have the right to inspect all information obtained and/or recorded in the course of the telemedicine visit and may receive copies of available information for a reasonable fee.  I understand that some of the potential risks of receiving the Services via telemedicine include:   Delay or interruption in medical evaluation due to technological equipment failure or disruption;  Information transmitted may not be sufficient (e.g. poor resolution of images) to allow for appropriate medical decision making by the Practitioner; and/or   In rare instances, security protocols could fail, causing a breach of personal health information.  Furthermore, I acknowledge that it is my responsibility to provide information about my medical history, conditions and care that is complete and accurate to the best of my ability. I acknowledge that Practitioner's advice, recommendations, and/or decision may be based on factors not within their control, such as incomplete or inaccurate data provided by me or distortions of diagnostic images or specimens that may result from  electronic transmissions. I understand that the practice of medicine is not an exact science and that Practitioner makes no warranties or guarantees regarding treatment outcomes. I acknowledge that I will receive a copy of this consent concurrently upon execution via email to the email address I last provided but may also request a printed copy by calling the office of [facility].    I understand that my insurance will be billed for this visit.   I have read or had this consent read to me.  I understand the contents of this consent, which adequately explains the benefits and risks of the Services being provided via telemedicine.   I have been provided ample opportunity to ask questions regarding this consent and the Services and have had my questions  answered to my satisfaction.  I give my informed consent for the services to be provided through the use of telemedicine in my medical care  By participating in this telemedicine visit I agree to the above.

## 2018-06-17 ENCOUNTER — Encounter: Payer: Self-pay | Admitting: Cardiovascular Disease

## 2018-06-17 ENCOUNTER — Telehealth (INDEPENDENT_AMBULATORY_CARE_PROVIDER_SITE_OTHER): Payer: Medicare Other | Admitting: Cardiovascular Disease

## 2018-06-17 VITALS — BP 129/83 | HR 70 | Temp 98.0°F | Ht 66.5 in | Wt 159.0 lb

## 2018-06-17 DIAGNOSIS — R0600 Dyspnea, unspecified: Secondary | ICD-10-CM

## 2018-06-17 DIAGNOSIS — I10 Essential (primary) hypertension: Secondary | ICD-10-CM

## 2018-06-17 DIAGNOSIS — J301 Allergic rhinitis due to pollen: Secondary | ICD-10-CM

## 2018-06-17 DIAGNOSIS — I340 Nonrheumatic mitral (valve) insufficiency: Secondary | ICD-10-CM

## 2018-06-17 DIAGNOSIS — I350 Nonrheumatic aortic (valve) stenosis: Secondary | ICD-10-CM

## 2018-06-17 DIAGNOSIS — R0609 Other forms of dyspnea: Secondary | ICD-10-CM | POA: Diagnosis not present

## 2018-06-17 MED ORDER — LEVOCETIRIZINE DIHYDROCHLORIDE 5 MG PO TABS: 5.0000 mg | ORAL_TABLET | Freq: Every evening | ORAL | 3 refills | Status: DC

## 2018-06-17 NOTE — Patient Instructions (Addendum)
Medication Instructions: Your physician recommends that you continue on your current medications as directed. Please refer to the Current Medication list given to you today.   Labwork: none  Procedures/Testing: We will call you when they are scheduling your cardiac CT  Follow-Up: 2 months with Dr.Koneswaran  Any Additional Special Instructions Will Be Listed Below (If Applicable).   Please keep a daily diary with a blood pressure and heart rate reading  If you need a refill on your cardiac medications before your next appointment, please call your pharmacy.

## 2018-06-17 NOTE — Progress Notes (Signed)
Virtual Visit via Video Note   This visit type was conducted due to national recommendations for restrictions regarding the COVID-19 Pandemic (e.g. social distancing) in an effort to limit this patient's exposure and mitigate transmission in our community.  Due to her co-morbid illnesses, this patient is at least at moderate risk for complications without adequate follow up.  This format is felt to be most appropriate for this patient at this time. All issues noted in this document were discussed and addressed.  Please refer to the patient's chart for her  consent to telehealth for [facility].   Date:  06/17/2018   ID:  Laurie Bridges, DOB [birth year], MRN [id number]  Patient Location: Home Provider Location: Office  PCP:  Asencion Noble, MD  Cardiologist:  Kate Sable, MD  Electrophysiologist:  None   Evaluation Performed:  Follow-Up Visit  Chief Complaint: Chest pain, shortness of breath and fatigue.  History of Present Illness:    Laurie Bridges is a 73 y.o. female with chest pain, shortness of breath and fatigue.  She has not gotten a cardiac CTA due to the ongoing [place] pandemic.  Echocardiogram demonstrated normal left ventricular systolic function, grade 1 diastolic dysfunction, and mild mitral regurgitation.  She walks with her sister in law. She helps to look after a friend with cancer. She went with him to the races in [place] and was on TV. She monitored her HR and it was up to 113 bpm while standing in line.  She has some shortness of breath when climbing stairs. She denies exertional chest pain. HR will be 70 bpm and then accelerates to 100 bpm with exertion. She has occasional pressure which is retrosternal which radiates to her throat, described as "pushing air up or a spasm" and causes her to cough.  She denies orthopnea paroxysmal nocturnal dyspnea.  She has occasional right ankle and foot swelling.  She denies claudication.  She checks her heart  rate, blood pressure, and O2 saturations daily.  We reviewed this log together.  The patient does not have symptoms concerning for COVID-19 infection (fever, chills, cough, or new shortness of breath).    Social history: She has been bowling since she was a teenager and bowls in 3 different leagues.  Her mother bowled and her son and grandson also bowl.   Past Medical History:  Diagnosis Date   Anemia    hx of Fe def many years ago   Anxiety    Arthritis    back   GERD (gastroesophageal reflux disease)    Hypertension    PONV (postoperative nausea and vomiting)    Past Surgical History:  Procedure Laterality Date   ABDOMINAL HYSTERECTOMY     partial hysterectomy, ovaries remain   BACK SURGERY     ESOPHAGEAL DILATION N/A 11/17/2017   Procedure: ESOPHAGEAL DILATION;  Surgeon: Rogene Houston, MD;  [facility];  Service: Endoscopy;  Laterality: N/A;   ESOPHAGOGASTRODUODENOSCOPY N/A 11/17/2017   Procedure: ESOPHAGOGASTRODUODENOSCOPY (EGD);  Surgeon: Rogene Houston, MD;  [facility];  Service: Endoscopy;  Laterality: N/A;  12:00   FOOT SURGERY       Current Meds  Medication Sig   atorvastatin (LIPITOR) 20 MG tablet Take 20 mg by mouth daily.   doxylamine, Sleep, (UNISOM) 25 MG tablet Take 50 mg by mouth at bedtime.    levocetirizine (XYZAL) 5 MG tablet Take 1 tablet (5 mg total) by mouth every evening.   Omeprazole-Sodium Bicarbonate (ZEGERID OTC)  20-1100 MG CAPS capsule Take 1 capsule by mouth daily before breakfast.   ondansetron (ZOFRAN) 4 MG tablet TAKE (1) TABLET BY MOUTH (3) TIMES DAILY AS NEEDED FOR NAUSEA/VOMITING.   [DISCONTINUED] famotidine (PEPCID) 20 MG tablet Take 1 tablet (20 mg total) by mouth at bedtime as needed for heartburn or indigestion.   [DISCONTINUED] levocetirizine (XYZAL) 5 MG tablet Take 0.5 tablets (2.5 mg total) by mouth every evening. (Patient taking differently: Take 5 mg by mouth every evening. )      Allergies:   Codeine; Demerol; Feldene [piroxicam]; Latex; Pyridium [phenazopyridine hcl]; and Sulfur   Social History   Tobacco Use   Smoking status: Never Smoker   Smokeless tobacco: Never Used  Substance Use Topics   Alcohol use: No   Drug use: No     Family Hx: The patient's family history includes Cancer in her maternal uncle, paternal uncle, and paternal uncle; Stroke in her father and mother.  ROS:   Please see the history of present illness.     All other systems reviewed and are negative.   Prior CV studies:   The following studies were reviewed today:  Echocardiogram 06/08/2018:   1. The left ventricle has normal systolic function, with an ejection fraction of 55-60%. The cavity size was normal. Left ventricular diastolic Doppler parameters are consistent with impaired relaxation.  2. The right ventricle has normal systolic function. The cavity was normal. There is no increase in right ventricular wall thickness.  3. No evidence of mitral valve stenosis.  4. The aortic valve is tricuspid. No stenosis of the aortic valve.  5. The aortic root is normal in size and structure  Labs/Other Tests and Data Reviewed:    EKG:  No ECG reviewed.  Recent Labs: 12/07/2017: Creatinine, Ser 1.10   Recent Lipid Panel No results found for: CHOL, TRIG, HDL, CHOLHDL, LDLCALC, LDLDIRECT  Wt Readings from Last 3 Encounters:  06/17/18 159 lb (72.1 kg)  03/29/18 159 lb (72.1 kg)  11/17/17 158 lb (71.7 kg)     Objective:    Vital Signs:  BP 129/83    Pulse 70    Temp 98 F (36.7 C) (Temporal)    Ht 5' 6.5" (1.689 m)    Wt 159 lb (72.1 kg)    BMI 25.28 kg/m    VITAL SIGNS:  reviewed  Gen: NAD HEENT: eomi, [place]/at Neck: no jvd Resp: No increased work of breathing Musculoskeletal: No edema, normal movement of all extremities Neuro: No focal deficits, alert and oriented x 3 Psych: Normal affect  ASSESSMENT & PLAN:    1.  Dyspnea on exertion: She has no history of  smoking or pulmonary disease. She continues to have episodic exertional dyspnea when climbing stairs or walking up gradual inclines.  She denies exertional chest pain.  She does feel her allergies are better controlled with Xyzal.   She has not gotten the coronary CT angiogram yet due to the ongoing COVID pandemic.  Echocardiogram was reassuring with normal left ventricular systolic function, grade 1 diastolic dysfunction, and mild mitral regurgitation.  2.  Hypertension: Blood pressure is controlled today without antihypertensive therapy.    It has been as high as 138/87.  She does check it daily.  She had been tried on higher doses of verapamil in the past which led to fatigue.  3.  Mitral regurgitation: Mild in severity by echocardiogram in May 2020.    4.  Seasonal allergies: I prescribed Xyzal 2.5 mg every evening.  COVID-19 Education: The signs and symptoms of COVID-19 were discussed with the patient and how to seek care for testing (follow up with PCP or arrange E-visit).  The importance of social distancing was discussed today.  Time:   Today, I have spent 25 minutes with the patient with telehealth technology discussing the above problems.     Medication Adjustments/Labs and Tests Ordered: Current medicines are reviewed at length with the patient today.  Concerns regarding medicines are outlined above.   Tests Ordered: No orders of the defined types were placed in this encounter.   Medication Changes: Meds ordered this encounter  Medications   levocetirizine (XYZAL) 5 MG tablet    Sig: Take 1 tablet (5 mg total) by mouth every evening.    Dispense:  90 tablet    Refill:  3    06/17/18 dose increased to 5 mg    Disposition:  Follow up in 2 month(s)  Signed, Kate Sable, MD  06/17/2018 11:42 AM    [place]

## 2018-06-20 ENCOUNTER — Telehealth: Payer: Self-pay

## 2018-06-20 DIAGNOSIS — Z01818 Encounter for other preprocedural examination: Secondary | ICD-10-CM

## 2018-06-20 DIAGNOSIS — R0600 Dyspnea, unspecified: Secondary | ICD-10-CM

## 2018-06-20 NOTE — Telephone Encounter (Signed)
BMET lab slip faxed to [facility] lab [phone], pt aware

## 2018-06-21 ENCOUNTER — Other Ambulatory Visit (HOSPITAL_COMMUNITY)
Admission: RE | Admit: 2018-06-21 | Discharge: 2018-06-21 | Disposition: A | Discharge status: Home / Self Care | Payer: Medicare Other | Source: Ambulatory Visit | Attending: Cardiovascular Disease | Admitting: Cardiovascular Disease

## 2018-06-21 ENCOUNTER — Other Ambulatory Visit: Payer: Self-pay

## 2018-06-21 DIAGNOSIS — R0600 Dyspnea, unspecified: Secondary | ICD-10-CM | POA: Diagnosis not present

## 2018-06-21 DIAGNOSIS — Z01818 Encounter for other preprocedural examination: Secondary | ICD-10-CM | POA: Diagnosis not present

## 2018-06-21 LAB — BASIC METABOLIC PANEL
Anion gap: 6 (ref 5–15)
BUN: 17 mg/dL (ref 8–23)
CO2: 28 mmol/L (ref 22–32)
Calcium: 9.7 mg/dL (ref 8.9–10.3)
Chloride: 107 mmol/L (ref 98–111)
Creatinine, Ser: 1.07 mg/dL — ABNORMAL HIGH (ref 0.44–1.00)
GFR calc Af Amer: >60 mL/min (ref >60)
GFR calc non Af Amer: 52 mL/min — ABNORMAL LOW (ref >60)
Glucose, Bld: 93 mg/dL (ref 70–99)
Potassium: 4.1 mmol/L (ref 3.5–5.1)
Sodium: 141 mmol/L (ref 135–145)

## 2018-06-23 ENCOUNTER — Encounter (HOSPITAL_COMMUNITY): Payer: Self-pay | Admitting: *Deleted

## 2018-06-23 ENCOUNTER — Telehealth: Payer: Self-pay

## 2018-06-23 MED ORDER — METOPROLOL TARTRATE 100 MG PO TABS: ORAL_TABLET | ORAL | 0 refills | Status: DC

## 2018-06-23 NOTE — Progress Notes (Signed)
Pt arrived to radiology for 230 CT Heart. Pt was not on our schedule. Spoke with [place] CT Tech to see if she was aware or if pt was pre-approved for insurance. Pt states that Jannet Askew from [facility] advised her to come to this appt. Pt states that she would like to reschedule. All are aware. Notified CT Heart navigator, Clarise Cruz

## 2018-06-23 NOTE — Telephone Encounter (Signed)
Kathyrn Sheriff, RN        We will need a new prescription sent in for Ms Caslin, we are going to reschedule her cardiac ct.    Thank you  Jannet Askew     Rx for lopressor 100 mg , 2 hrs prior to procedure sent to [facility]

## 2018-06-24 NOTE — Addendum Note (Signed)
Addended by: Barbarann Ehlers A on: 06/24/2018 12:07 PM   Modules accepted: Orders

## 2018-06-29 ENCOUNTER — Other Ambulatory Visit (HOSPITAL_COMMUNITY)
Admission: RE | Admit: 2018-06-29 | Discharge: 2018-06-29 | Disposition: A | Discharge status: Home / Self Care | Payer: Medicare Other | Source: Ambulatory Visit | Attending: Cardiovascular Disease | Admitting: Cardiovascular Disease

## 2018-06-29 ENCOUNTER — Other Ambulatory Visit: Payer: Self-pay

## 2018-06-29 DIAGNOSIS — R0600 Dyspnea, unspecified: Secondary | ICD-10-CM | POA: Insufficient documentation

## 2018-06-29 LAB — BASIC METABOLIC PANEL
Anion gap: 11 (ref 5–15)
BUN: 14 mg/dL (ref 8–23)
CO2: 23 mmol/L (ref 22–32)
Calcium: 9.5 mg/dL (ref 8.9–10.3)
Chloride: 107 mmol/L (ref 98–111)
Creatinine, Ser: 0.98 mg/dL (ref 0.44–1.00)
GFR calc Af Amer: >60 mL/min (ref >60)
GFR calc non Af Amer: 58 mL/min — ABNORMAL LOW (ref >60)
Glucose, Bld: 97 mg/dL (ref 70–99)
Potassium: 4.1 mmol/L (ref 3.5–5.1)
Sodium: 141 mmol/L (ref 135–145)

## 2018-06-30 ENCOUNTER — Telehealth: Payer: Self-pay | Admitting: *Deleted

## 2018-06-30 NOTE — Telephone Encounter (Signed)
Called patient with test results. No answer. Left message to call back.  

## 2018-06-30 NOTE — Telephone Encounter (Signed)
-----   Message from Arnoldo Lenis, MD sent at 06/30/2018 12:59 PM EDT ----- Labs look good  Zandra Abts MD

## 2018-07-06 ENCOUNTER — Telehealth (HOSPITAL_COMMUNITY): Payer: Self-pay | Admitting: Emergency Medicine

## 2018-07-06 NOTE — Telephone Encounter (Signed)
Left message on voicemail with name and callback number Samarion Ehle RN Navigator Cardiac Imaging [facility] [phone] Office [phone] Cell  

## 2018-07-07 ENCOUNTER — Ambulatory Visit (HOSPITAL_COMMUNITY)
Admission: RE | Admit: 2018-07-07 | Discharge: 2018-07-07 | Disposition: A | Discharge status: Home / Self Care | Payer: Medicare Other | Source: Ambulatory Visit | Attending: Cardiovascular Disease | Admitting: Cardiovascular Disease

## 2018-07-07 ENCOUNTER — Other Ambulatory Visit: Payer: Self-pay

## 2018-07-07 DIAGNOSIS — I209 Angina pectoris, unspecified: Secondary | ICD-10-CM | POA: Insufficient documentation

## 2018-07-07 MED ORDER — IOHEXOL 350 MG/ML SOLN
90.0000 mL | Freq: Once | INTRAVENOUS | Status: AC | PRN
  Administered 2018-07-07: 90 mL via INTRAVENOUS

## 2018-07-07 MED ORDER — NITROGLYCERIN 0.4 MG SL SUBL
0.8000 mg | SUBLINGUAL_TABLET | Freq: Once | SUBLINGUAL | Status: AC
  Administered 2018-07-07: 0.8 mg via SUBLINGUAL

## 2018-07-07 MED ORDER — NITROGLYCERIN 0.4 MG SL SUBL
SUBLINGUAL_TABLET | SUBLINGUAL | Status: AC
  Filled 2018-07-07: qty 2

## 2018-07-18 ENCOUNTER — Telehealth: Payer: Self-pay

## 2018-07-18 MED ORDER — ATORVASTATIN CALCIUM 40 MG PO TABS: 40.0000 mg | ORAL_TABLET | Freq: Every day | ORAL | 3 refills | Status: DC

## 2018-07-18 MED ORDER — ASPIRIN EC 81 MG PO TBEC: 81.0000 mg | DELAYED_RELEASE_TABLET | Freq: Every day | ORAL | 3 refills | Status: DC

## 2018-07-18 NOTE — Telephone Encounter (Signed)
-----   Message from Herminio Commons, MD sent at 07/08/2018  2:27 PM EDT ----- There is mild stenosis in 3 coronary territories, none requiring anything other than medical therapy. Her calcium score places her in the "high risk" category for future cardiac events. I would recommend increasing atorvastatin to 40 mg and starting ASA 81 mg daily.  I tried calling the patient to discuss results but had to leave a message. Please try calling her.

## 2018-07-18 NOTE — Telephone Encounter (Signed)
Results of CT given to patient, e-scribed atorvastatin 40 mg to pharmacy, pt will start ASA 81 mg qd

## 2018-07-27 DIAGNOSIS — H25013 Cortical age-related cataract, bilateral: Secondary | ICD-10-CM | POA: Diagnosis not present

## 2018-07-27 DIAGNOSIS — H2513 Age-related nuclear cataract, bilateral: Secondary | ICD-10-CM | POA: Diagnosis not present

## 2018-07-27 DIAGNOSIS — H524 Presbyopia: Secondary | ICD-10-CM | POA: Diagnosis not present

## 2018-07-27 DIAGNOSIS — H35433 Paving stone degeneration of retina, bilateral: Secondary | ICD-10-CM | POA: Diagnosis not present

## 2018-08-22 ENCOUNTER — Other Ambulatory Visit (HOSPITAL_COMMUNITY): Payer: Self-pay | Admitting: Internal Medicine

## 2018-08-22 DIAGNOSIS — Z1231 Encounter for screening mammogram for malignant neoplasm of breast: Secondary | ICD-10-CM

## 2018-08-31 ENCOUNTER — Telehealth (INDEPENDENT_AMBULATORY_CARE_PROVIDER_SITE_OTHER): Payer: Medicare Other | Admitting: Cardiovascular Disease

## 2018-08-31 ENCOUNTER — Encounter: Payer: Self-pay | Admitting: Cardiovascular Disease

## 2018-08-31 VITALS — BP 109/71 | HR 77 | Temp 97.1°F | Ht 66.5 in | Wt 159.0 lb

## 2018-08-31 DIAGNOSIS — I10 Essential (primary) hypertension: Secondary | ICD-10-CM

## 2018-08-31 DIAGNOSIS — R931 Abnormal findings on diagnostic imaging of heart and coronary circulation: Secondary | ICD-10-CM

## 2018-08-31 DIAGNOSIS — R0609 Other forms of dyspnea: Secondary | ICD-10-CM

## 2018-08-31 DIAGNOSIS — I2510 Atherosclerotic heart disease of native coronary artery without angina pectoris: Secondary | ICD-10-CM

## 2018-08-31 DIAGNOSIS — I340 Nonrheumatic mitral (valve) insufficiency: Secondary | ICD-10-CM

## 2018-08-31 DIAGNOSIS — J301 Allergic rhinitis due to pollen: Secondary | ICD-10-CM

## 2018-08-31 DIAGNOSIS — R0600 Dyspnea, unspecified: Secondary | ICD-10-CM

## 2018-08-31 NOTE — Progress Notes (Signed)
Virtual Visit via Telephone Note   This visit type was conducted due to national recommendations for restrictions regarding the COVID-19 Pandemic (e.g. social distancing) in an effort to limit this patient's exposure and mitigate transmission in our community.  Due to her co-morbid illnesses, this patient is at least at moderate risk for complications without adequate follow up.  This format is felt to be most appropriate for this patient at this time.  The patient did not have access to video technology/had technical difficulties with video requiring transitioning to audio format only (telephone).  All issues noted in this document were discussed and addressed.  No physical exam could be performed with this format.  Please refer to the patient's chart for her  consent to telehealth for [facility].   Date:  08/31/2018   ID:  Laurie Bridges, DOB 1945-06-10, MRN [id number]  Patient Location: Home Provider Location: Home  PCP:  Asencion Noble, MD  Cardiologist:  Kate Sable, MD  Electrophysiologist:  None   Evaluation Performed:  Follow-Up Visit  Chief Complaint:  SOB  History of Present Illness:    Laurie Bridges is a 73 y.o. female with shortness of breath.  She has some shortness of breath when climbing stairs. She denies exertional chest pain.  Coronary CT angiogram reviewed in detail below.  She had a coronary calcium score of 234 placing her in the 80th percentile and therefore high risk for future cardiac event.  For this reason I started aspirin 81 mg and atorvastatin 40 mg.  She has "good and bad days". Some times she has more shortness of breath particularly when there are bad storms.  She has morning sinus congestion and drainage.  She denies chest pain per se.  She describes a "prickly feeling" in her chest at times, in the upper left and right sides just below the clavicles.  She walked 1.5 miles yesterday.  She denies bleeding problems and joint  aches.  The patient does not have symptoms concerning for COVID-19 infection (fever, chills, cough).   Social history:She has been bowling since she was a teenager and bowls in 3 different leagues. Her mother bowled and her son and grandson also bowl.   Past Medical History:  Diagnosis Date  . Anemia    hx of Fe def many years ago  . Anxiety   . Arthritis    back  . GERD (gastroesophageal reflux disease)   . Hypertension   . PONV (postoperative nausea and vomiting)    Past Surgical History:  Procedure Laterality Date  . ABDOMINAL HYSTERECTOMY     partial hysterectomy, ovaries remain  . BACK SURGERY    . ESOPHAGEAL DILATION N/A 11/17/2017   Procedure: ESOPHAGEAL DILATION;  Surgeon: Rogene Houston, MD;  [facility];  Service: Endoscopy;  Laterality: N/A;  . ESOPHAGOGASTRODUODENOSCOPY N/A 11/17/2017   Procedure: ESOPHAGOGASTRODUODENOSCOPY (EGD);  Surgeon: Rogene Houston, MD;  [facility];  Service: Endoscopy;  Laterality: N/A;  12:00  . FOOT SURGERY       Current Meds  Medication Sig  . aspirin EC 81 MG tablet Take 1 tablet (81 mg total) by mouth daily.  Marland Kitchen atorvastatin (LIPITOR) 40 MG tablet Take 1 tablet (40 mg total) by mouth daily.  Marland Kitchen doxylamine, Sleep, (UNISOM) 25 MG tablet Take 50 mg by mouth at bedtime.   Marland Kitchen levocetirizine (XYZAL) 5 MG tablet Take 1 tablet (5 mg total) by mouth every evening.  Earney Navy Bicarbonate (ZEGERID OTC) 20-1100  MG CAPS capsule Take 1 capsule by mouth daily before breakfast.  . ondansetron (ZOFRAN) 4 MG tablet TAKE (1) TABLET BY MOUTH (3) TIMES DAILY AS NEEDED FOR NAUSEA/VOMITING.     Allergies:   Codeine, Demerol, Feldene [piroxicam], Latex, Pyridium [phenazopyridine hcl], and Sulfur   Social History   Tobacco Use  . Smoking status: Never Smoker  . Smokeless tobacco: Never Used  Substance Use Topics  . Alcohol use: No  . Drug use: No     Family Hx: The patient's family history includes Cancer in her  maternal uncle, paternal uncle, and paternal uncle; Stroke in her father and mother.  ROS:   Please see the history of present illness.     All other systems reviewed and are negative.   Prior CV studies:   The following studies were reviewed today:  Coronary CT angiography 07/07/2018:  FINDINGS: Non-cardiac: See separate report from [facility].  Pulmonary veins drain normally to the left atrium.  Calcium Score: 234 Agatston units.  Coronary Arteries: Right dominant with no anomalies  LM: No plaque or stenosis.  LAD system: Mixed plaque mid LAD with mild (<50%) stenosis. Misregistration artifact affects the mid LAD.  Circumflex system: Mixed plaque in the mid LCx, mild (<50%) stenosis.  RCA system: Calcified plaque with minimal stenosis mid RCA. There was misregistration artifact in the mid RCA. Mixed plaque with mild (<50%) stenosis in the distal RCA.  IMPRESSION: 1. Coronary artery calcium score 234 Agatston units. This places the patient in the 80th percentile for age and gender, suggesting high risk for future cardiac events.  2. There appears to be mild stenosis in the mid LAD, mid LCX, and mid RCA.  Labs/Other Tests and Data Reviewed:    EKG:  No ECG reviewed.  Recent Labs: 06/29/2018: BUN 14; Creatinine, Ser 0.98; Potassium 4.1; Sodium 141   Recent Lipid Panel No results found for: CHOL, TRIG, HDL, CHOLHDL, LDLCALC, LDLDIRECT  Wt Readings from Last 3 Encounters:  08/31/18 159 lb (72.1 kg)  06/17/18 159 lb (72.1 kg)  03/29/18 159 lb (72.1 kg)     Objective:    Vital Signs:  BP 109/71   Pulse 77   Temp (!) 97.1 F (36.2 C) (Temporal)   Ht 5' 6.5" (1.689 m)   Wt 159 lb (72.1 kg)   SpO2 97%   BMI 25.28 kg/m    VITAL SIGNS:  reviewed  ASSESSMENT & PLAN:    1. CAD: Coronary CT angiogram reviewed in detail.  She had a coronary calcium score of 234 placing her in the 80th percentile and therefore high risk for future cardiac  event. For this reason I started aspirin 81 mg and atorvastatin 40 mg.  She has some exertional dyspnea when climbing stairs but denies exertional chest pain.  No changes to therapy.  2. Hypertension: Blood pressure is normal. No changes to therapy.  3. Mitral regurgitation: Mild in severity by echocardiogram in May 2020.   4. Seasonal allergies: I prescribed Xyzal 5 mg every evening.   COVID-19 Education: The signs and symptoms of COVID-19 were discussed with the patient and how to seek care for testing (follow up with PCP or arrange E-visit).  The importance of social distancing was discussed today.  Time:   Today, I have spent 10 minutes with the patient with telehealth technology discussing the above problems.     Medication Adjustments/Labs and Tests Ordered: Current medicines are reviewed at length with the patient today.  Concerns regarding medicines are  outlined above.   Tests Ordered: No orders of the defined types were placed in this encounter.   Medication Changes: No orders of the defined types were placed in this encounter.   Follow Up:  In Person in 3 month(s)  Signed, Kate Sable, MD  08/31/2018 9:29 AM    [place]

## 2018-08-31 NOTE — Patient Instructions (Signed)
Medication Instructions: Your physician recommends that you continue on your current medications as directed. Please refer to the Current Medication list given to you today.   Labwork: None today  Procedures/Testing: None today  Follow-Up: 3 months with Dr.Koneswaran  Any Additional Special Instructions Will Be Listed Below (If Applicable).     If you need a refill on your cardiac medications before your next appointment, please call your pharmacy.      Thank you for choosing [facility] !

## 2018-09-01 ENCOUNTER — Ambulatory Visit (HOSPITAL_COMMUNITY)
Admission: RE | Admit: 2018-09-01 | Discharge: 2018-09-01 | Disposition: A | Discharge status: Home / Self Care | Payer: Medicare Other | Source: Ambulatory Visit | Attending: Internal Medicine | Admitting: Internal Medicine

## 2018-09-01 ENCOUNTER — Other Ambulatory Visit: Payer: Self-pay

## 2018-09-01 DIAGNOSIS — Z1231 Encounter for screening mammogram for malignant neoplasm of breast: Secondary | ICD-10-CM

## 2018-09-22 DIAGNOSIS — M25512 Pain in left shoulder: Secondary | ICD-10-CM | POA: Diagnosis not present

## 2018-09-23 DIAGNOSIS — N183 Chronic kidney disease, stage 3 (moderate): Secondary | ICD-10-CM | POA: Diagnosis not present

## 2018-09-23 DIAGNOSIS — E785 Hyperlipidemia, unspecified: Secondary | ICD-10-CM | POA: Diagnosis not present

## 2018-09-23 DIAGNOSIS — I10 Essential (primary) hypertension: Secondary | ICD-10-CM | POA: Diagnosis not present

## 2018-09-29 ENCOUNTER — Other Ambulatory Visit: Payer: Self-pay

## 2018-09-29 ENCOUNTER — Ambulatory Visit (HOSPITAL_COMMUNITY): Payer: Medicare Other | Attending: Orthopaedic Surgery

## 2018-09-29 ENCOUNTER — Encounter (HOSPITAL_COMMUNITY): Payer: Self-pay

## 2018-09-29 DIAGNOSIS — R29898 Other symptoms and signs involving the musculoskeletal system: Secondary | ICD-10-CM | POA: Diagnosis not present

## 2018-09-29 NOTE — Patient Instructions (Signed)
Try to complete 2 times a day for 4 weeks. 10 repetitions each.  Stretches  Start the neck rotation exercise by standing erect or sitting in a sturdy chair with your eyes looking straight ahead. Keep your chin level, turn your head to the right as far as you can, hold the position for 30 seconds and then return to the starting position. Repeat the exercise to the left to complete one repetition. Perform 10 repetitions. Concentrate on keeping your shoulders level throughout the exercise.   To perform the chest stretch, stand erect, move your arms behind your back and clasp your hands together. Slowly lift your hands and arms away from your body. Hold the stretch 15 to 30 seconds. Relax and repeat three times.  SHRUGS WITH WEIGHTS  While holding weights, raise your shoulders upward towards your ears as shown. Shrug both shoulders at the same time.   Shoulder flexion with weight  Stand with feet hip distance apart and core engaged. Slowly lift weights overhead with thumbs facing up, then lower back down slowly. Repeat.      Supine Scapular retraction and protraction  This exercise stretches and strengthens the muscles of your chest and middle back. You will need an exercise mat and a resistance band to perform the exercise. Lie on your back on the mat, grasp the resistance band with your hands shoulder-width apart and extend your arms straight above your shoulders. This is the starting position. There should be a slight resistance in the band. Pull your hands apart, squeeze your shoulder blades together and extend your arms out to your sides. With control, slowly return your arms to the starting position. Repeat 10 times.

## 2018-09-29 NOTE — Therapy (Signed)
[place], [place], [address] Phone: [phone]   Fax:  [phone]  Occupational Therapy Evaluation  Patient Details  Name: Laurie Bridges MRN: [id number] Date of Birth: May 08, 1945 Referring Provider (OT): Dr. Griffin Basil   Encounter Date: 09/29/2018  OT End of Session - 09/29/18 1554    Visit Number  1    Number of Visits  1    Authorization Type  1) medicare 2) [employer] medicare supplement 3) [employer] [place]    OT Start Time  1300    OT Stop Time  1338    OT Time Calculation (min)  38 min    Activity Tolerance  Patient tolerated treatment well    Behavior During Therapy  [facility] for tasks assessed/performed       Past Medical History:  Diagnosis Date  . Anemia    hx of Fe def many years ago  . Anxiety   . Arthritis    back  . GERD (gastroesophageal reflux disease)   . Hypertension   . PONV (postoperative nausea and vomiting)     Past Surgical History:  Procedure Laterality Date  . ABDOMINAL HYSTERECTOMY     partial hysterectomy, ovaries remain  . BACK SURGERY    . ESOPHAGEAL DILATION N/A 11/17/2017   Procedure: ESOPHAGEAL DILATION;  Surgeon: Rogene Houston, MD;  [facility];  Service: Endoscopy;  Laterality: N/A;  . ESOPHAGOGASTRODUODENOSCOPY N/A 11/17/2017   Procedure: ESOPHAGOGASTRODUODENOSCOPY (EGD);  Surgeon: Rogene Houston, MD;  [facility];  Service: Endoscopy;  Laterality: N/A;  12:00  . FOOT SURGERY      There were no vitals filed for this visit.  Subjective Assessment - 09/29/18 1549    Subjective   S: It will slide or pop and sometimes it will hurt but not all the time.    Pertinent History  Patient is a  73 y/o female S/P left sternoclavicular instability which had initially started in March 2020 and began to get progressively worse. Dr. Griffin Basil has referred patient to occupational therapy for evaluation and treatment.    Special Tests  N/A    Patient Stated  Goals  To stop the feeling of instability.    Currently in Pain?  No/denies   Pt reports no pain at time of eval. Will hurt occassionally and be 3/10.       [facility] OT Assessment - 09/29/18 1309      Assessment   Medical Diagnosis  left sternoclavicular instability    Referring Provider (OT)  Dr. Griffin Basil    Onset Date/Surgical Date  --   March 2020   Hand Dominance  Right    Next MD Visit  None scheduled    Prior Therapy  None for this condition      Precautions   Precautions  None      Restrictions   Weight Bearing Restrictions  No      Balance Screen   Has the patient fallen in the past 6 months  No      Home  Environment   Family/patient expects to be discharged to:  Private residence      Prior Function   Level of [place]  Retired    Leisure  Pt enjoys bowling      ADL   ADL comments  Pt reports no difficulty with performing any daily tasks.       Mobility   Mobility  Status  Independent      Written Expression   Dominant Hand  Right      Vision - History   Baseline Vision  Wears glasses all the time      Cognition   Overall Cognitive Status  Within Functional Limits for tasks assessed      Observation/Other Assessments   Focus on Therapeutic Outcomes (FOTO)   N/A      Posture/Postural Control   Posture/Postural Control  No significant limitations      ROM / Strength   AROM / PROM / Strength  AROM;Strength      Palpation   Palpation comment  left side sternoclavicular joint is slightly anterior compared to right side. No fascial restrictions palpated.       AROM   Overall AROM   Within functional limits for tasks performed    Overall AROM Comments  Assessed seated. IR/er abducted    AROM Assessment Site  Shoulder      Strength   Overall Strength  Within functional limits for tasks performed    Overall Strength Comments  Assessed seated. IR/er abducted 5/5 in all ranges.     Strength Assessment Site  Shoulder                       OT Education - 09/29/18 1553    Education Details  Neck stretch: cervical rotation. Chest stretch. weighted shoulder shrugs, weighted shoulder flexion, weighted supine scapular retraction/protration    Person(s) Educated  Patient    Methods  Explanation;Demonstration;Verbal cues;Handout;Tactile cues    Comprehension  Verbalized understanding;Returned demonstration       OT Short Term Goals - 09/29/18 1602      OT SHORT TERM GOAL #1   Title  patient will be educated and independent with HEP in order to increase her left sternoclavicular stability that will allow her increased comfort when completing daily and leisure tasks.    Time  1    Period  Days    Status  Achieved    Target Date  09/29/18               Plan - 09/29/18 1557    Clinical Impression Statement  A: Pt is a 73 y/o female S/P left sternoclavicular instability with reports of feeling as if it's slipping and pops with no reason. She does notice it more when completing horizontal abduction. She reports occasional pain although not consistant. She has not strength or ROM deficits. Patient was provided with HEP to increase sternoclavicular joint stability. Discussed precautions when returning to bowling. Patient is in agreement with recommendation.    OT Occupational Profile and History  Problem Focused Assessment - Including review of records relating to presenting problem    Occupational performance deficits (Please refer to evaluation for details):  Leisure    Body Structure / Function / Physical Skills  Strength;Pain    Rehab Potential  Excellent    Clinical Decision Making  Limited treatment options, no task modification necessary    Comorbidities Affecting Occupational Performance:  None    Modification or Assistance to Complete Evaluation   No modification of tasks or assist necessary to complete eval    OT Frequency  One time visit    OT Treatment/Interventions  Patient/family  education    Plan  P: 1 time OT visit with HEP established. patient is to follow up as needed.    Consulted and Agree with Plan of Care  Patient  Patient will benefit from skilled therapeutic intervention in order to improve the following deficits and impairments:   Body Structure / Function / Physical Skills: Strength, Pain       Visit Diagnosis: Other symptoms and signs involving the musculoskeletal system - Plan: Ot plan of care cert/re-cert    Problem List Patient Active Problem List   Diagnosis Date Noted  . Gastroesophageal reflux disease without esophagitis 09/14/2017  . Esophageal dysphagia 09/14/2017  . Dyspnea 08/02/2013  . GERD (gastroesophageal reflux disease) 08/02/2013   Ailene Ravel, OTR/L,CBIS  [phone]  09/29/2018, 4:07 PM  [place] [address] [place], [place], [address] Phone: [phone]   Fax:  [phone]  Name: Laurie Bridges MRN: [id number] Date of Birth: [birth year]

## 2018-09-30 DIAGNOSIS — N183 Chronic kidney disease, stage 3 (moderate): Secondary | ICD-10-CM | POA: Diagnosis not present

## 2018-09-30 DIAGNOSIS — I2510 Atherosclerotic heart disease of native coronary artery without angina pectoris: Secondary | ICD-10-CM | POA: Diagnosis not present

## 2018-09-30 DIAGNOSIS — I129 Hypertensive chronic kidney disease with stage 1 through stage 4 chronic kidney disease, or unspecified chronic kidney disease: Secondary | ICD-10-CM | POA: Diagnosis not present

## 2018-10-10 DIAGNOSIS — Z23 Encounter for immunization: Secondary | ICD-10-CM | POA: Diagnosis not present

## 2018-10-13 ENCOUNTER — Ambulatory Visit (INDEPENDENT_AMBULATORY_CARE_PROVIDER_SITE_OTHER): Payer: Medicare Other | Admitting: Nurse Practitioner

## 2018-11-17 ENCOUNTER — Other Ambulatory Visit: Payer: Self-pay

## 2018-11-17 ENCOUNTER — Encounter (INDEPENDENT_AMBULATORY_CARE_PROVIDER_SITE_OTHER): Payer: Self-pay | Admitting: Nurse Practitioner

## 2018-11-17 ENCOUNTER — Ambulatory Visit (INDEPENDENT_AMBULATORY_CARE_PROVIDER_SITE_OTHER): Payer: Medicare Other | Admitting: Nurse Practitioner

## 2018-11-17 ENCOUNTER — Encounter (INDEPENDENT_AMBULATORY_CARE_PROVIDER_SITE_OTHER): Payer: Self-pay | Admitting: *Deleted

## 2018-11-17 VITALS — BP 152/83 | HR 81 | Temp 98.0°F | Ht 66.5 in | Wt 161.7 lb

## 2018-11-17 DIAGNOSIS — R1310 Dysphagia, unspecified: Secondary | ICD-10-CM

## 2018-11-17 DIAGNOSIS — K8020 Calculus of gallbladder without cholecystitis without obstruction: Secondary | ICD-10-CM

## 2018-11-17 DIAGNOSIS — R1319 Other dysphagia: Secondary | ICD-10-CM

## 2018-11-17 DIAGNOSIS — R112 Nausea with vomiting, unspecified: Secondary | ICD-10-CM | POA: Diagnosis not present

## 2018-11-17 NOTE — Patient Instructions (Signed)
1.  Complete the provided lab order   2.  Abdominal sonogram  3.  Barium swallow with tablet  4.  Referral to Dr. Constance Haw to discuss elective gallbladder surgery  5.  You are due for a screening colonoscopy July 2021

## 2018-11-17 NOTE — Progress Notes (Signed)
Subjective:    Patient ID: Laurie Bridges, female    DOB: August 21, 1945, 73 y.o.   MRN: [id number]  HPI Laurie Bridges is a 73 year old female with a past medical history of anxiety, hypertension, dysphagia, GERD and gallstones. She presents today with complaints of episodic upper abdominal swelling and vomiting. She is vomiting 3 to 4 times weekly for the past few weeks which occurs about 1 hour after she eats any food.  Yesterday she ate a banana sandwich and vomited 1 hour later.  No fever, sweats or chills. No weight loss.  She stated these are the same symptoms she had a year ago when she was diagnosed with gallstones.  She underwent an abdominal ultrasound November 2019 which showed a large gallstone in the neck of the gallbladder and a 1.2 cm right hepatic lesion. An abd/pelvic CT 12/07/2017 identified gallstones without gallbladder wall thickening and the right hepatic lesion was consistent with a cyst. She underwent an EGD 11/17/2017 which showed a benign-appearing mild esophageal stenosis which was dilated, a 3 cm hiatal hernia and her stomach was normal.  She is taking aspirin 81 mg daily.  She takes Zegerid 20 mg once daily.  She is passing a normal brown bowel movement 3 to 4 days weekly. She takes Miralax once weekly. No rectal bleeding or black stools.  He underwent colonoscopy July 2011 which showed external hemorrhoids, no polyps.  She was advised by Dr. Dorien Chihuahua to repeat a colonoscopy in 10 years.  No family history of colorectal cancer.  Abdominal sonogram 12/02/2017: 1. Gallstone within the neck of the gallbladder. No sonographic Murphy's sign. 2. RIGHT hepatic lesion 1.2 centimeters in diameter and not completely characterized on ultrasound. 3. RIGHT renal parenchymal thinning and hypoechoic lesion in the RIGHT kidney, likely representing a complicated cyst. 4. Recommend CT of the abdomen and pelvis with renal lesion protocol to further characterize liver and RIGHT renal  lesions.  Abdominal/pelvic CT 12/07/2017: 1. No acute findings or explanation for the patient's symptoms. 2. There are 2 low-density, nonenhancing lesions in the right hepatic lobe which are most consistent with cysts. No worrisome hepatic findings. 3. Adjacent simple cysts in the right kidney. No suspicious renal findings. 4. Cholelithiasis without gallbladder wall thickening or biliary dilatation. 5. Lower lumbar spondylosis with right lateral disc herniation at L4-5. 6.  Aortic Atherosclerosis   EGD 11/17/2017: - Normal proximal esophagus and mid esophagus. - Benign-appearing mild esophageal stenosis proxima to GEJ. Dilated. - Z-line regular, 36 cm from the incisors. - 3 cm hiatal hernia. - Normal stomach. - Normal duodenal bulb and second portion of the duodenum. - No specimens collected.  Colonoscopy 08/08/2009: External hemorrhoids otherwise normal, 10 year recall.   Past Medical History:  Diagnosis Date   Anemia    hx of Fe def many years ago   Anxiety    Arthritis    back   GERD (gastroesophageal reflux disease)    Hypertension    PONV (postoperative nausea and vomiting)    Past Surgical History:  Procedure Laterality Date   ABDOMINAL HYSTERECTOMY     partial hysterectomy, ovaries remain   BACK SURGERY     ESOPHAGEAL DILATION N/A 11/17/2017   Procedure: ESOPHAGEAL DILATION;  Surgeon: Rogene Houston, MD;  [facility];  Service: Endoscopy;  Laterality: N/A;   ESOPHAGOGASTRODUODENOSCOPY N/A 11/17/2017   Procedure: ESOPHAGOGASTRODUODENOSCOPY (EGD);  Surgeon: Rogene Houston, MD;  [facility];  Service: Endoscopy;  Laterality: N/A;  12:00  FOOT SURGERY     Current Outpatient Medications on File Prior to Visit  Medication Sig Dispense Refill   aspirin EC 81 MG tablet Take 1 tablet (81 mg total) by mouth daily. 90 tablet 3   atorvastatin (LIPITOR) 40 MG tablet Take 1 tablet (40 mg total) by mouth daily. 90 tablet 3    doxylamine, Sleep, (UNISOM) 25 MG tablet Take 50 mg by mouth at bedtime.      levocetirizine (XYZAL) 5 MG tablet Take 1 tablet (5 mg total) by mouth every evening. 90 tablet 3   Omeprazole-Sodium Bicarbonate (ZEGERID OTC) 20-1100 MG CAPS capsule Take 1 capsule by mouth daily before breakfast.     ondansetron (ZOFRAN) 4 MG tablet TAKE (1) TABLET BY MOUTH (3) TIMES DAILY AS NEEDED FOR NAUSEA/VOMITING. 30 tablet 1   No current facility-administered medications on file prior to visit.     Review of Systems see HPI, all other systems reviewed and are negative     Objective:   Physical Exam BP (!) 152/83    Pulse 81    Temp 98 F (36.7 C) (Oral)    Ht 5' 6.5" (1.689 m)    Wt 161 lb 11.2 oz (73.3 kg)    BMI 25.71 kg/m  General: 73 year old female well-developed in no acute distress Eyes: Sclera nonicteric, Mouth: Dentition intact, no ulcers or lesions Neck: Supple, no lymphadenopathy or thyromegaly Heart: Regular rate and rhythm, no murmurs Lungs: Breath sounds clear throughout Abdomen: Soft, nontender, no masses or organomegaly, positive bowel sounds all 4 quadrants Extremities: No edema Neuro: Alert and oriented x4, no focal deficits    Assessment & Plan:   25. 73 year old female with gallstones, upper abdominal bloat and vomiting.  Patient requests an oral surgeon referral for elective gallbladder surgery. -CBC, CMP, CRP and Lipase level -Repeat abdominal ultrasound -Avoid fatty foods -General surgery consult with Dr. Constance Haw entered in epic  2. GERD, benign distal esophageal stenosis and a 3cm hiatal hernia -Barium swallow with tablet, esophageal stenosis present she will require an EGD for dilatation -Continue Zegerid daily  -Further follow-up to be determined after lab results, abdominal sonogram and barium swallow results received  3.  Colon cancer screening -Next colonoscopy due July 2021

## 2018-11-18 LAB — CBC WITH DIFFERENTIAL/PLATELET
Absolute Monocytes: 520 cells/uL (ref 200–950)
Basophils Absolute: 51 cells/uL (ref 0–200)
Basophils Relative: 1 %
Eosinophils Absolute: 270 cells/uL (ref 15–500)
Eosinophils Relative: 5.3 %
HCT: 41.7 % (ref 35.0–45.0)
Hemoglobin: 13.9 g/dL (ref 11.7–15.5)
Lymphs Abs: 1550 cells/uL (ref 850–3900)
MCH: 28.3 pg (ref 27.0–33.0)
MCHC: 33.3 g/dL (ref 32.0–36.0)
MCV: 84.8 fL (ref 80.0–100.0)
MPV: 9.7 fL (ref 7.5–12.5)
Monocytes Relative: 10.2 %
Neutro Abs: 2708 cells/uL (ref 1500–7800)
Neutrophils Relative %: 53.1 %
Platelets: 307 10*3/uL (ref 140–400)
RBC: 4.92 10*6/uL (ref 3.80–5.10)
RDW: 12.9 % (ref 11.0–15.0)
Total Lymphocyte: 30.4 %
WBC: 5.1 10*3/uL (ref 3.8–10.8)

## 2018-11-18 LAB — LIPASE: Lipase: 37 U/L (ref 7–60)

## 2018-11-24 ENCOUNTER — Other Ambulatory Visit: Payer: Self-pay

## 2018-11-24 ENCOUNTER — Encounter: Payer: Self-pay | Admitting: General Surgery

## 2018-11-24 ENCOUNTER — Ambulatory Visit (INDEPENDENT_AMBULATORY_CARE_PROVIDER_SITE_OTHER): Payer: Medicare Other | Admitting: General Surgery

## 2018-11-24 VITALS — BP 154/79 | HR 76 | Temp 98.2°F | Resp 16 | Ht 66.5 in | Wt 160.0 lb

## 2018-11-24 DIAGNOSIS — K8020 Calculus of gallbladder without cholecystitis without obstruction: Secondary | ICD-10-CM

## 2018-11-24 NOTE — Patient Instructions (Signed)
Cholelithiasis  Cholelithiasis is also called "gallstones." It is a kind of gallbladder disease. The gallbladder is an organ that stores a liquid (bile) that helps you digest fat. Gallstones may not cause symptoms (may be silent gallstones) until they cause a blockage, and then they can cause pain (gallbladder attack). Follow these instructions at home:  Take over-the-counter and prescription medicines only as told by your doctor.  Stay at a healthy weight.  Eat healthy foods. This includes: ? Eating fewer fatty foods, like fried foods. ? Eating fewer refined carbs (refined carbohydrates). Refined carbs are breads and grains that are highly processed, like white bread and white rice. Instead, choose whole grains like whole-wheat bread and brown rice. ? Eating more fiber. Almonds, fresh fruit, and beans are healthy sources of fiber.  Keep all follow-up visits as told by your doctor. This is important. Contact a doctor if:  You have sudden pain in the upper right side of your belly (abdomen). Pain might spread to your right shoulder or your chest. This may be a sign of a gallbladder attack.  You feel sick to your stomach (are nauseous).  You throw up (vomit).  You have been diagnosed with gallstones that have no symptoms and you get: ? Belly pain. ? Discomfort, burning, or fullness in the upper part of your belly (indigestion). Get help right away if:  You have sudden pain in the upper right side of your belly, and it lasts for more than 2 hours.  You have belly pain that lasts for more than 5 hours.  You have a fever or chills.  You keep feeling sick to your stomach or you keep throwing up.  Your skin or the whites of your eyes turn yellow (jaundice).  You have dark-colored pee (urine).  You have light-colored poop (stool). Summary  Cholelithiasis is also called "gallstones."  The gallbladder is an organ that stores a liquid (bile) that helps you digest fat.  Silent  gallstones are gallstones that do not cause symptoms.  A gallbladder attack may cause sudden pain in the upper right side of your belly. Pain might spread to your right shoulder or your chest. If this happens, contact your doctor.  If you have sudden pain in the upper right side of your belly that lasts for more than 2 hours, get help right away. This information is not intended to replace advice given to you by your health care provider. Make sure you discuss any questions you have with your health care provider. Document Released: 06/17/2007 Document Revised: 12/11/2016 Document Reviewed: 09/15/2015 Elsevier Patient Education  2020 [employer].    Laparoscopic Cholecystectomy Laparoscopic cholecystectomy is surgery to remove the gallbladder. The gallbladder is a pear-shaped organ that lies beneath the liver on the right side of the body. The gallbladder stores bile, which is a fluid that helps the body to digest fats. Cholecystectomy is often done for inflammation of the gallbladder (cholecystitis). This condition is usually caused by a buildup of gallstones (cholelithiasis) in the gallbladder. Gallstones can block the flow of bile, which can result in inflammation and pain. In severe cases, emergency surgery may be required. This procedure is done though small incisions in your abdomen (laparoscopic surgery). A thin scope with a camera (laparoscope) is inserted through one incision. Thin surgical instruments are inserted through the other incisions. In some cases, a laparoscopic procedure may be turned into a type of surgery that is done through a larger incision (open surgery). Tell a health care provider  about:  Any allergies you have.  All medicines you are taking, including vitamins, herbs, eye drops, creams, and over-the-counter medicines.  Any problems you or family members have had with anesthetic medicines.  Any blood disorders you have.  Any surgeries you have had.  Any  medical conditions you have.  Whether you are pregnant or may be pregnant. What are the risks? Generally, this is a safe procedure. However, problems may occur, including:  Infection.  Bleeding.  Allergic reactions to medicines.  Damage to other structures or organs.  A stone remaining in the common bile duct. The common bile duct carries bile from the gallbladder into the small intestine.  A bile leak from the cyst duct that is clipped when your gallbladder is removed. Medicines  Ask your health care provider about: ? Changing or stopping your regular medicines. This is especially important if you are taking diabetes medicines or blood thinners. ? Taking medicines such as aspirin and ibuprofen. These medicines can thin your blood. Do not take these medicines before your procedure if your health care provider instructs you not to.  You may be given antibiotic medicine to help prevent infection. General instructions  Let your health care provider know if you develop a cold or an infection before surgery.  Plan to have someone take you home from the hospital or clinic.  Ask your health care provider how your surgical site will be marked or identified. What happens during the procedure?   To reduce your risk of infection: ? Your health care team will wash or sanitize their hands. ? Your skin will be washed with soap. ? Hair may be removed from the surgical area.  An IV tube may be inserted into one of your veins.  You will be given one or more of the following: ? A medicine to help you relax (sedative). ? A medicine to make you fall asleep (general anesthetic).  A breathing tube will be placed in your mouth.  Your surgeon will make several small cuts (incisions) in your abdomen.  The laparoscope will be inserted through one of the small incisions. The camera on the laparoscope will send images to a TV screen (monitor) in the operating room. This lets your surgeon see  inside your abdomen.  Air-like gas will be pumped into your abdomen. This will expand your abdomen to give the surgeon more room to perform the surgery.  Other tools that are needed for the procedure will be inserted through the other incisions. The gallbladder will be removed through one of the incisions.  Your common bile duct may be examined. If stones are found in the common bile duct, they may be removed.  After your gallbladder has been removed, the incisions will be closed with stitches (sutures), staples, or skin glue.  Your incisions may be covered with a bandage (dressing). The procedure may vary among health care providers and hospitals. What happens after the procedure?  Your blood pressure, heart rate, breathing rate, and blood oxygen level will be monitored until the medicines you were given have worn off.  You will be given medicines as needed to control your pain.  Do not drive for 24 hours if you were given a sedative. This information is not intended to replace advice given to you by your health care provider. Make sure you discuss any questions you have with your health care provider. Document Released: 12/29/2004 Document Revised: 12/11/2016 Document Reviewed: 06/17/2015 Elsevier Patient Education  2020 [employer].

## 2018-11-24 NOTE — Progress Notes (Signed)
[facility] History and Physical  Reason for Referral: Gallstone  Referring Physician:  Carl Best NP (GI)  Chief Complaint    Cholelithiasis      Laurie Bridges is a 73 y.o. female.  HPI: Laurie Bridges is a very pleasant 73 yo who has a of reported bloating and vomiting that she says is episodic in nature. She has had this for sometime (at least over 1 year) and it can be associated with some greasy foods but can be at other times even with a banana sandwich. She says that she feels full and has to vomit. She has a known hiatal hernia and has had prior esophageal stretching. She complains of no RUQ pain or epigastric type pain. Her symptoms are more swelling/ bloating of her abdomen. She has had [place] in the past that demonstrated a single gallstone.  She is undergoing further imaging with GI this week to determine if she needs her esophagus stretched again and to evaluate the gallbladder.   Past Medical History:  Diagnosis Date  . Anemia    hx of Fe def many years ago  . Anxiety   . Arthritis    back  . GERD (gastroesophageal reflux disease)   . Hypertension   . PONV (postoperative nausea and vomiting)     Past Surgical History:  Procedure Laterality Date  . ABDOMINAL HYSTERECTOMY     partial hysterectomy, ovaries remain  . BACK SURGERY    . ESOPHAGEAL DILATION N/A 11/17/2017   Procedure: ESOPHAGEAL DILATION;  Surgeon: Rogene Houston, MD;  [facility];  Service: Endoscopy;  Laterality: N/A;  . ESOPHAGOGASTRODUODENOSCOPY N/A 11/17/2017   Procedure: ESOPHAGOGASTRODUODENOSCOPY (EGD);  Surgeon: Rogene Houston, MD;  [facility];  Service: Endoscopy;  Laterality: N/A;  12:00  . FOOT SURGERY      Family History  Problem Relation Age of Onset  . Stroke Mother   . Stroke Father   . Cancer Maternal Uncle   . Cancer Paternal Uncle   . Cancer Paternal Uncle     Social History   Tobacco Use  . Smoking status: Never Smoker   . Smokeless tobacco: Never Used  Substance Use Topics  . Alcohol use: No  . Drug use: No    Medications: I have reviewed the patient's current medications. Allergies as of 11/24/2018      Reactions   Codeine Other (See Comments)   Syncope    Demerol Rash   Feldene [piroxicam] Rash   Latex Rash   Pyridium [phenazopyridine Hcl] Itching, Rash   Sulfur Rash      Medication List       Accurate as of November 24, 2018 11:59 PM. If you have any questions, ask your nurse or doctor.        aspirin EC 81 MG tablet Take 1 tablet (81 mg total) by mouth daily.   atorvastatin 40 MG tablet Commonly known as: LIPITOR Take 1 tablet (40 mg total) by mouth daily.   doxylamine (Sleep) 25 MG tablet Commonly known as: UNISOM Take 50 mg by mouth at bedtime.   levocetirizine 5 MG tablet Commonly known as: Xyzal Take 1 tablet (5 mg total) by mouth every evening.   ondansetron 4 MG tablet Commonly known as: ZOFRAN TAKE (1) TABLET BY MOUTH (3) TIMES DAILY AS NEEDED FOR NAUSEA/VOMITING.   Zegerid OTC 20-1100 MG Caps capsule Generic drug: Omeprazole-Sodium Bicarbonate Take 1 capsule by mouth daily before breakfast.  ROS:  A comprehensive review of systems was negative except for: Ears, nose, mouth, throat, and face: positive for sinus problems Gastrointestinal: positive for nausea, reflux symptoms and vomiting Musculoskeletal: positive for back pain and stiff joints Allergic/Immunologic: positive for hay fever  Blood pressure (!) 154/79, pulse 76, temperature 98.2 F (36.8 C), temperature source Oral, resp. rate 16, height 5' 6.5" (1.689 m), weight 160 lb (72.6 kg), SpO2 97 %. Physical Exam Vitals signs reviewed.  Constitutional:      Appearance: Normal appearance.  HENT:     Head: Normocephalic.     Nose: Nose normal.     Mouth/Throat:     Mouth: Mucous membranes are moist.  Eyes:     Extraocular Movements: Extraocular movements intact.     Pupils: Pupils are equal,  round, and reactive to light.  Neck:     Musculoskeletal: Normal range of motion. No neck rigidity.  Cardiovascular:     Rate and Rhythm: Normal rate and regular rhythm.  Pulmonary:     Effort: Pulmonary effort is normal.     Breath sounds: Normal breath sounds.  Abdominal:     General: There is no distension.     Palpations: Abdomen is soft.     Tenderness: There is no abdominal tenderness.  Musculoskeletal: Normal range of motion.  Skin:    General: Skin is warm and dry.     Findings: Rash: [place].  Neurological:     General: No focal deficit present.     Mental Status: She is alert and oriented to person, place, and time.  Psychiatric:        Mood and Affect: Mood normal.        Behavior: Behavior normal.        Thought Content: Thought content normal.        Judgment: Judgment normal.     Results: Personally reviewed images- single gallstone, no thickening, hiatal hernia  11/25/18 - US  IMPRESSION: 1. Single small gallstone, unchanged. 2. Stable 1 cm lesion in the right lobe of the liver, probably a cyst as demonstrated on the prior CT scan of 12/07/2017. 3. No acute abnormalities.  11/25/18 Esophagram  IMPRESSION: Small to moderate-sized hiatal hernia with mild gastroesophageal reflux.  Mild diffuse esophageal dysmotility without evidence of obstruction.  Laryngeal penetration without aspiration.  Small follicular and piriform sinus residuals.  Assessment & Plan:  Laurie Bridges is a 73 y.o. female with symptoms of vomiting and bloating. I have discussed that she has no RUQ pain or epigastric pain, and although her symptoms can be related to her gallbladder, they may not be related. We discussed that we can pursue cholecystectomy and that it may help with the symptoms but there is no guarantee. I have spoke with Dr. Laural Golden after seeing her and prior to her images above were completed. We decided to see what the imaging showed prior to deciding about the  cholecystectomy.  I am happy to perform her surgery if that is what the patient wants to pursue.   -I have reached out to Dr. Laural Golden to see his thoughts on the above imaging.  -Once the patient decides about surgery, we will get her scheduled.   I counseled the patient about the indication, risks and benefits of laparoscopic cholecystectomy.  She understands there is a very small chance for bleeding, infection, injury to normal structures (including common bile duct), conversion to open surgery, persistent symptoms, evolution of postcholecystectomy diarrhea, need for secondary interventions, anesthesia reaction, cardiopulmonary  issues and other risks not specifically detailed here. I described the expected recovery, the plan for follow-up and the restrictions during the recovery phase.  All questions were answered.   Virl Cagey 12/01/2018, 1:04 PM

## 2018-11-25 ENCOUNTER — Ambulatory Visit (HOSPITAL_COMMUNITY)
Admission: RE | Admit: 2018-11-25 | Discharge: 2018-11-25 | Disposition: A | Discharge status: Home / Self Care | Payer: Medicare Other | Source: Ambulatory Visit | Attending: Nurse Practitioner | Admitting: Nurse Practitioner

## 2018-11-25 DIAGNOSIS — R1310 Dysphagia, unspecified: Secondary | ICD-10-CM | POA: Diagnosis not present

## 2018-11-25 DIAGNOSIS — R1319 Other dysphagia: Secondary | ICD-10-CM

## 2018-11-25 DIAGNOSIS — R112 Nausea with vomiting, unspecified: Secondary | ICD-10-CM | POA: Insufficient documentation

## 2018-11-25 DIAGNOSIS — K8020 Calculus of gallbladder without cholecystitis without obstruction: Secondary | ICD-10-CM | POA: Insufficient documentation

## 2018-11-25 DIAGNOSIS — K449 Diaphragmatic hernia without obstruction or gangrene: Secondary | ICD-10-CM | POA: Diagnosis not present

## 2018-11-25 DIAGNOSIS — K219 Gastro-esophageal reflux disease without esophagitis: Secondary | ICD-10-CM | POA: Diagnosis not present

## 2018-12-21 ENCOUNTER — Telehealth (INDEPENDENT_AMBULATORY_CARE_PROVIDER_SITE_OTHER): Payer: Self-pay | Admitting: Internal Medicine

## 2018-12-21 NOTE — Telephone Encounter (Signed)
I was able to reach patient today. I did call over the weekend but she did not answer. She says only time she has nausea and/or vomiting as if she eats too much. If she watches food intake she does not have any heartburn or vomiting.  She denies abdominal pain.  She also does not experience heartburn as long as she takes the medication. He has not had any epigastric chest shoulder intrascapular pain. Patient also reports not having any dysphagia.  Barium study suggests motility disorder in addition to hiatal hernia.  She also had mild laryngeal penetration but no aspiration.  Patient does not have coughing spells while she eats.  Therefore will monitor her symptoms. I agree with Dr. Constance Haw that she does not have typical symptoms of cholelithiasis and therefore will monitor clinical course for now.  She has single stone in her gallbladder.  Patient will call office if she develops solid food dysphagia or has symptoms suggestive of gallbladder disease. Office visit on as-needed basis.

## 2018-12-26 ENCOUNTER — Telehealth: Payer: Self-pay

## 2018-12-26 NOTE — Telephone Encounter (Signed)

## 2018-12-27 ENCOUNTER — Telehealth (INDEPENDENT_AMBULATORY_CARE_PROVIDER_SITE_OTHER): Payer: Medicare Other | Admitting: Cardiovascular Disease

## 2018-12-27 ENCOUNTER — Encounter: Payer: Self-pay | Admitting: Cardiovascular Disease

## 2018-12-27 VITALS — BP 116/72 | HR 78 | Ht 66.5 in | Wt 158.0 lb

## 2018-12-27 DIAGNOSIS — I10 Essential (primary) hypertension: Secondary | ICD-10-CM

## 2018-12-27 DIAGNOSIS — I2583 Coronary atherosclerosis due to lipid rich plaque: Secondary | ICD-10-CM | POA: Diagnosis not present

## 2018-12-27 DIAGNOSIS — I2510 Atherosclerotic heart disease of native coronary artery without angina pectoris: Secondary | ICD-10-CM

## 2018-12-27 DIAGNOSIS — R0600 Dyspnea, unspecified: Secondary | ICD-10-CM

## 2018-12-27 DIAGNOSIS — R0609 Other forms of dyspnea: Secondary | ICD-10-CM

## 2018-12-27 DIAGNOSIS — I340 Nonrheumatic mitral (valve) insufficiency: Secondary | ICD-10-CM

## 2018-12-27 NOTE — Progress Notes (Signed)
Virtual Visit via Telephone Note   This visit type was conducted due to national recommendations for restrictions regarding the COVID-19 Pandemic (e.g. social distancing) in an effort to limit this patient's exposure and mitigate transmission in our community.  Due to her co-morbid illnesses, this patient is at least at moderate risk for complications without adequate follow up.  This format is felt to be most appropriate for this patient at this time.  The patient did not have access to video technology/had technical difficulties with video requiring transitioning to audio format only (telephone).  All issues noted in this document were discussed and addressed.  No physical exam could be performed with this format.  Please refer to the patient's chart for her  consent to telehealth for [facility].   Date:  12/27/2018   ID:  Laurie Bridges, DOB Jun 17, 1945, MRN [id number]  Patient Location: Home Provider Location: Office  PCP:  Asencion Noble, MD  Cardiologist:  Kate Sable, MD  Electrophysiologist:  None   Evaluation Performed:  Follow-Up Visit  Chief Complaint:  SOB  History of Present Illness:    Laurie Bridges is a 73 y.o. female with shortness of breath.  She has occasional shortness of breath when climbing stairs but symptoms have improved.  She denies exertional chest pain. Her heart rates aren't accelerating as much as they had been.  Coronary CT angiogram reviewed in detail below.  She had a coronary calcium score of 234 placing her in the 80th percentile and therefore high risk for future cardiac event.  For this reason I started aspirin 81 mg and atorvastatin 40 mg.  She has been having issues with nausea and vomiting and bloating and follows with GI.  She has occasional issues with vertigo. She denies falls and syncope.  These symptoms have improved over the past month with dietary changes.  She had some musculoskeletal issues with her chest which  have improved with exercise.  She continues to bowl and takes care of her friend, Fritz Pickerel.   Past Medical History:  Diagnosis Date  . Anemia    hx of Fe def many years ago  . Anxiety   . Arthritis    back  . GERD (gastroesophageal reflux disease)   . Hypertension   . PONV (postoperative nausea and vomiting)    Past Surgical History:  Procedure Laterality Date  . ABDOMINAL HYSTERECTOMY     partial hysterectomy, ovaries remain  . BACK SURGERY    . ESOPHAGEAL DILATION N/A 11/17/2017   Procedure: ESOPHAGEAL DILATION;  Surgeon: Rogene Houston, MD;  [facility];  Service: Endoscopy;  Laterality: N/A;  . ESOPHAGOGASTRODUODENOSCOPY N/A 11/17/2017   Procedure: ESOPHAGOGASTRODUODENOSCOPY (EGD);  Surgeon: Rogene Houston, MD;  [facility];  Service: Endoscopy;  Laterality: N/A;  12:00  . FOOT SURGERY       Current Meds  Medication Sig  . aspirin EC 81 MG tablet Take 1 tablet (81 mg total) by mouth daily.  Marland Kitchen atorvastatin (LIPITOR) 40 MG tablet Take 1 tablet (40 mg total) by mouth daily.  Marland Kitchen doxylamine, Sleep, (UNISOM) 25 MG tablet Take 50 mg by mouth at bedtime.   Earney Navy Bicarbonate (ZEGERID OTC) 20-1100 MG CAPS capsule Take 1 capsule by mouth daily before breakfast.     Allergies:   Codeine, Demerol, Feldene [piroxicam], Latex, Pyridium [phenazopyridine hcl], and Sulfur   Social History   Tobacco Use  . Smoking status: Never Smoker  . Smokeless tobacco: Never Used  Substance Use Topics  . Alcohol use: No  . Drug use: No     Family Hx: The patient's family history includes Cancer in her maternal uncle, paternal uncle, and paternal uncle; Stroke in her father and mother.  ROS:   Please see the history of present illness.     All other systems reviewed and are negative.   Prior CV studies:   The following studies were reviewed today:  Coronary CT angiography 07/07/2018:  FINDINGS: Non-cardiac: See separate report from [facility]  Radiology.  Pulmonary veins drain normally to the left atrium.  Calcium Score: 234 Agatston units.  Coronary Arteries: Right dominant with no anomalies  LM: No plaque or stenosis.  LAD system: Mixed plaque mid LAD with mild (<50%) stenosis. Misregistration artifact affects the mid LAD.  Circumflex system: Mixed plaque in the mid LCx, mild (<50%) stenosis.  RCA system: Calcified plaque with minimal stenosis mid RCA. There was misregistration artifact in the mid RCA. Mixed plaque with mild (<50%) stenosis in the distal RCA.  IMPRESSION: 1. Coronary artery calcium score 234 Agatston units. This places the patient in the 80th percentile for age and gender, suggesting high risk for future cardiac events.  2. There appears to be mild stenosis in the mid LAD, mid LCX, and mid RCA.  Labs/Other Tests and Data Reviewed:    EKG:  No ECG reviewed.  Recent Labs: 06/29/2018: BUN 14; Creatinine, Ser 0.98; Potassium 4.1; Sodium 141 11/17/2018: Hemoglobin 13.9; Platelets 307   Recent Lipid Panel No results found for: CHOL, TRIG, HDL, CHOLHDL, LDLCALC, LDLDIRECT  Wt Readings from Last 3 Encounters:  12/27/18 158 lb (71.7 kg)  11/24/18 160 lb (72.6 kg)  11/17/18 161 lb 11.2 oz (73.3 kg)     Objective:    Vital Signs:  BP 116/72   Pulse 78   Ht 5' 6.5" (1.689 m)   Wt 158 lb (71.7 kg)   BMI 25.12 kg/m    VITAL SIGNS:  reviewed  ASSESSMENT & PLAN:    1. CAD: Coronary CT angiogram reviewed in detail.  She had a coronary calcium score of 234 placing her in the 80th percentile and therefore high risk for future cardiac event. For this reason I started aspirin 81 mg and atorvastatin 40 mg.  She has occasional exertional dyspnea when climbing stairs but denies exertional chest pain.  No changes to therapy.  2. Hypertension: Blood pressure isnormal. No changes to therapy.  3. Mitral regurgitation: Mild in severity by echocardiogram inMay 2020.     COVID-19  Education: The signs and symptoms of COVID-19 were discussed with the patient and how to seek care for testing (follow up with PCP or arrange E-visit).  The importance of social distancing was discussed today.  Time:   Today, I have spent 10 minutes with the patient with telehealth technology discussing the above problems.     Medication Adjustments/Labs and Tests Ordered: Current medicines are reviewed at length with the patient today.  Concerns regarding medicines are outlined above.   Tests Ordered: No orders of the defined types were placed in this encounter.   Medication Changes: No orders of the defined types were placed in this encounter.   Follow Up:  Either In Person or Virtual in 1 year(s)  Signed, Kate Sable, MD  12/27/2018 8:48 AM    [place]

## 2018-12-27 NOTE — Patient Instructions (Signed)
Medication Instructions:  Your physician recommends that you continue on your current medications as directed. Please refer to the Current Medication list given to you today.  *If you need a refill on your cardiac medications before your next appointment, please call your pharmacy*  Lab Work: None today If you have labs (blood work) drawn today and your tests are completely normal, you will receive your results only by: . MyChart Message (if you have MyChart) OR . A paper copy in the mail If you have any lab test that is abnormal or we need to change your treatment, we will call you to review the results.  Testing/Procedures: None today  Follow-Up: At [facility], you and your health needs are our priority.  As part of our continuing mission to provide you with exceptional heart care, we have created designated Provider Care Teams.  These Care Teams include your primary Cardiologist (physician) and Advanced Practice Providers (APPs -  Physician Assistants and Nurse Practitioners) who all work together to provide you with the care you need, when you need it.  Your next appointment:   12 month(s)  The format for your next appointment:   Virtual Visit   Provider:   Suresh Koneswaran, MD  Other Instructions None     Thank you for choosing [facility] !         

## 2019-02-23 DIAGNOSIS — Z0001 Encounter for general adult medical examination with abnormal findings: Secondary | ICD-10-CM | POA: Diagnosis not present

## 2019-03-10 DIAGNOSIS — Z23 Encounter for immunization: Secondary | ICD-10-CM | POA: Diagnosis not present

## 2019-04-07 DIAGNOSIS — Z23 Encounter for immunization: Secondary | ICD-10-CM | POA: Diagnosis not present

## 2019-04-17 DIAGNOSIS — Z79899 Other long term (current) drug therapy: Secondary | ICD-10-CM | POA: Diagnosis not present

## 2019-04-17 DIAGNOSIS — I10 Essential (primary) hypertension: Secondary | ICD-10-CM | POA: Diagnosis not present

## 2019-04-17 DIAGNOSIS — I700 Atherosclerosis of aorta: Secondary | ICD-10-CM | POA: Diagnosis not present

## 2019-04-17 DIAGNOSIS — K219 Gastro-esophageal reflux disease without esophagitis: Secondary | ICD-10-CM | POA: Diagnosis not present

## 2019-04-17 DIAGNOSIS — E785 Hyperlipidemia, unspecified: Secondary | ICD-10-CM | POA: Diagnosis not present

## 2019-04-17 DIAGNOSIS — N183 Chronic kidney disease, stage 3 unspecified: Secondary | ICD-10-CM | POA: Diagnosis not present

## 2019-04-17 DIAGNOSIS — M1990 Unspecified osteoarthritis, unspecified site: Secondary | ICD-10-CM | POA: Diagnosis not present

## 2019-04-24 DIAGNOSIS — I2510 Atherosclerotic heart disease of native coronary artery without angina pectoris: Secondary | ICD-10-CM | POA: Diagnosis not present

## 2019-04-24 DIAGNOSIS — K219 Gastro-esophageal reflux disease without esophagitis: Secondary | ICD-10-CM | POA: Diagnosis not present

## 2019-04-24 DIAGNOSIS — E785 Hyperlipidemia, unspecified: Secondary | ICD-10-CM | POA: Diagnosis not present

## 2019-04-24 DIAGNOSIS — I700 Atherosclerosis of aorta: Secondary | ICD-10-CM | POA: Diagnosis not present

## 2019-06-30 ENCOUNTER — Emergency Department (HOSPITAL_COMMUNITY)
Admission: EM | Admit: 2019-06-30 | Discharge: 2019-07-01 | Disposition: A | Discharge status: Home / Self Care | Payer: Medicare Other | Attending: Emergency Medicine | Admitting: Emergency Medicine

## 2019-06-30 ENCOUNTER — Other Ambulatory Visit: Payer: Self-pay

## 2019-06-30 ENCOUNTER — Encounter (HOSPITAL_COMMUNITY): Payer: Self-pay | Admitting: Emergency Medicine

## 2019-06-30 DIAGNOSIS — I10 Essential (primary) hypertension: Secondary | ICD-10-CM | POA: Diagnosis not present

## 2019-06-30 DIAGNOSIS — Z7982 Long term (current) use of aspirin: Secondary | ICD-10-CM | POA: Insufficient documentation

## 2019-06-30 DIAGNOSIS — Z91040 Latex allergy status: Secondary | ICD-10-CM | POA: Diagnosis not present

## 2019-06-30 DIAGNOSIS — R030 Elevated blood-pressure reading, without diagnosis of hypertension: Secondary | ICD-10-CM | POA: Diagnosis present

## 2019-06-30 NOTE — ED Provider Notes (Signed)
[facility] EMERGENCY DEPARTMENT Provider Note   CSN: [id number] Arrival date & time: 06/30/19  2232   History Chief Complaint  Patient presents with  . Hypertension    Laurie Bridges is a 74 y.o. female.  The history is provided by the patient.  Hypertension  She has history of hypertension and noted blood pressure was elevated to 180/100.  She checks her blood pressure routinely and had generally been in the normal range until 1 week ago when it started to run high.  Today is the first day that it went very high.  She denies headache, tinnitus, epistaxis, chest pain, dyspnea.  She had been taken off of all blood pressure medication, but she did have leftover verapamil 240 mg and she did take 1 tablet tonight.  She relates that when she was taking verapamil 240 mg on a regular basis, "it wiped me out".  Past Medical History:  Diagnosis Date  . Anemia    hx of Fe def many years ago  . Anxiety   . Arthritis    back  . GERD (gastroesophageal reflux disease)   . Hypertension   . PONV (postoperative nausea and vomiting)     Patient Active Problem List   Diagnosis Date Noted  . Gallstones 11/17/2018  . Gastroesophageal reflux disease without esophagitis 09/14/2017  . Esophageal dysphagia 09/14/2017  . Dyspnea 08/02/2013  . GERD (gastroesophageal reflux disease) 08/02/2013    Past Surgical History:  Procedure Laterality Date  . ABDOMINAL HYSTERECTOMY     partial hysterectomy, ovaries remain  . BACK SURGERY    . ESOPHAGEAL DILATION N/A 11/17/2017   Procedure: ESOPHAGEAL DILATION;  Surgeon: Rogene Houston, MD;  [facility];  Service: Endoscopy;  Laterality: N/A;  . ESOPHAGOGASTRODUODENOSCOPY N/A 11/17/2017   Procedure: ESOPHAGOGASTRODUODENOSCOPY (EGD);  Surgeon: Rogene Houston, MD;  [facility];  Service: Endoscopy;  Laterality: N/A;  12:00  . FOOT SURGERY       OB History   No obstetric history on file.     Family History  Problem  Relation Age of Onset  . Stroke Mother   . Stroke Father   . Cancer Maternal Uncle   . Cancer Paternal Uncle   . Cancer Paternal Uncle     Social History   Tobacco Use  . Smoking status: Never Smoker  . Smokeless tobacco: Never Used  Vaping Use  . Vaping Use: Never used  Substance Use Topics  . Alcohol use: No  . Drug use: No    Home Medications Prior to Admission medications   Medication Sig Start Date End Date Taking? Authorizing Provider  aspirin EC 81 MG tablet Take 1 tablet (81 mg total) by mouth daily. 07/18/18   Herminio Commons, MD  atorvastatin (LIPITOR) 40 MG tablet Take 1 tablet (40 mg total) by mouth daily. 07/18/18 12/27/18  Herminio Commons, MD  doxylamine, Sleep, (UNISOM) 25 MG tablet Take 50 mg by mouth at bedtime.     [provider]  Omeprazole-Sodium Bicarbonate (ZEGERID OTC) 20-1100 MG CAPS capsule Take 1 capsule by mouth daily before breakfast.    [provider]    Allergies    Codeine, Demerol, Feldene [piroxicam], Latex, Pyridium [phenazopyridine hcl], and Sulfur  Review of Systems   Review of Systems  All other systems reviewed and are negative.   Physical Exam Updated Vital Signs BP (!) 192/101 (BP Location: Left Arm)   Pulse 91   Temp 98.2 F (36.8 C) (  Oral)   Resp 18   Ht 5' 6.5" (1.689 m)   Wt 71.7 kg   SpO2 99%   BMI 25.12 kg/m   Physical Exam Vitals and nursing note reviewed.   74 year old female, resting comfortably and in no acute distress. Vital signs are significant for elevated blood pressure. Oxygen saturation is 99%, which is normal. Head is normocephalic and atraumatic. PERRLA, EOMI. Oropharynx is clear. Neck is nontender and supple without adenopathy or JVD. Back is nontender and there is no CVA tenderness. Lungs are clear without rales, wheezes, or rhonchi. Chest is nontender. Heart has regular rate and rhythm without murmur. Abdomen is soft, flat, nontender without masses or hepatosplenomegaly  and peristalsis is normoactive. Extremities have no cyanosis or edema, full range of motion is present. Skin is warm and dry without rash. Neurologic: Mental status is normal, cranial nerves are intact, there are no motor or sensory deficits.  ED Results / Procedures / Treatments    Procedures Procedures  Medications Ordered in ED Medications - No data to display  ED Course  I have reviewed the triage vital signs and the nursing notes.  MDM Rules/Calculators/A&P Elevated blood pressure and patient with history of hypertension.  No evidence of any endorgan damage.  At this point, no indication for emergent blood pressure reduction.  Old records are reviewed, and recorded blood pressures have been mildly elevated.  At this point, it is felt that the best course going forward is careful monitoring.  Patient is advised to record her blood pressure twice a day and to take that record with her when she sees her primary care provider.  Advised to follow-up within the next week.  Return precautions discussed.  Final Clinical Impression(s) / ED Diagnoses Final diagnoses:  Elevated blood pressure reading with diagnosis of hypertension    Rx / DC Orders ED Discharge Orders    None       Delora Fuel, MD 28/78/67 [phone]

## 2019-06-30 NOTE — ED Triage Notes (Signed)
Patient states that her blood pressure has been elevated over the last few days. Patient states that she has not been on any blood pressure medications for a while but took a verapamil 250 mg tab tonight for blood pressure. Patient's current blood pressure in triage is 192/101. Patient denies any symptoms.

## 2019-07-01 NOTE — Discharge Instructions (Signed)
Please take your blood pressure twice a day and keep a record of it.  Please bring this record with you when you see your primary care provider next week.  Do not take your verapamil unless advised to take it by your primary care provider.  Return if you are having any problems at home.

## 2019-07-03 ENCOUNTER — Other Ambulatory Visit: Payer: Self-pay

## 2019-07-03 ENCOUNTER — Ambulatory Visit
Admission: EM | Admit: 2019-07-03 | Discharge: 2019-07-03 | Disposition: A | Discharge status: Home / Self Care | Payer: Medicare Other | Attending: Emergency Medicine | Admitting: Emergency Medicine

## 2019-07-03 ENCOUNTER — Telehealth: Payer: Self-pay | Admitting: Cardiovascular Disease

## 2019-07-03 ENCOUNTER — Encounter: Payer: Self-pay | Admitting: Emergency Medicine

## 2019-07-03 DIAGNOSIS — R030 Elevated blood-pressure reading, without diagnosis of hypertension: Secondary | ICD-10-CM

## 2019-07-03 MED ORDER — AMLODIPINE BESYLATE 5 MG PO TABS: 5.0000 mg | ORAL_TABLET | Freq: Every day | ORAL | 0 refills | Status: DC

## 2019-07-03 NOTE — ED Triage Notes (Signed)
Pt states bp has been elevated since Friday.

## 2019-07-03 NOTE — Telephone Encounter (Signed)
Patient called and said her blood Pressure has been up since Friday. She went to ER Friday night and it was 192/102, she was told to follow up with Dr. Willey Blade but he's out of the office so she wants to get an appt with Dr. Bronson Ing before she leaves going out of town next week. She stated her BP was 149/89 this morning when she checked it.

## 2019-07-03 NOTE — Discharge Instructions (Signed)
Blood pressure medication prescribed.  Take as directed Please continue to monitor blood pressure at home and keep a log Eat a well balanced diet of fruits, vegetables and lean meats.  Avoid foods high in fat and salt Drink water.  At least half your body weight in ounces Exercise for at least 30 minutes daily Follow up with PCP for further evaluation and management Return or go to the ED if you have any new or worsening symptoms such as vision changes, fatigue, dizziness, chest pain, shortness of breath, nausea, swelling in your hands or feet, urinary symptoms, etc..Marland Kitchen

## 2019-07-03 NOTE — ED Provider Notes (Signed)
[place]   [id number] 07/03/19 Arrival Time: 1906  CC: Elevated blood pressure  SUBJECTIVE:  Laurie Bridges is a 74 y.o. female who presents for blood pressure x 1 week.  Hx of HTN 2 years ago.  Has been keeping a log of blood pressure over the past week.  Was on average 160/90.  Went to the ED and was 186/94.  Currently not taking medication.  Does have a PCP, was unable to get in to see with PCP.  Denies HA, vision changes, dizziness, lightheadedness, chest pain, shortness of breath, numbness or tingling in extremities, abdominal pain, changes in bowel or bladder habits.    Does admit to recent loss of partner, this may be contributing to her elevated blood pressure.    ROS: As per HPI.  All other pertinent ROS negative.     Past Medical History:  Diagnosis Date  . Anemia    hx of Fe def many years ago  . Anxiety   . Arthritis    back  . GERD (gastroesophageal reflux disease)   . Hypertension   . PONV (postoperative nausea and vomiting)    Past Surgical History:  Procedure Laterality Date  . ABDOMINAL HYSTERECTOMY     partial hysterectomy, ovaries remain  . BACK SURGERY    . ESOPHAGEAL DILATION N/A 11/17/2017   Procedure: ESOPHAGEAL DILATION;  Surgeon: Rogene Houston, MD;  [facility];  Service: Endoscopy;  Laterality: N/A;  . ESOPHAGOGASTRODUODENOSCOPY N/A 11/17/2017   Procedure: ESOPHAGOGASTRODUODENOSCOPY (EGD);  Surgeon: Rogene Houston, MD;  Location: [facility];  Service: Endoscopy;  Laterality: N/A;  12:00  . FOOT SURGERY     Allergies  Allergen Reactions  . Codeine Other (See Comments)    Syncope   . Demerol Rash  . Feldene [Piroxicam] Rash  . Latex Rash  . Pyridium [Phenazopyridine Hcl] Itching and Rash  . Sulfur Rash   No current facility-administered medications on file prior to encounter.   Current Outpatient Medications on File Prior to Encounter  Medication Sig Dispense Refill  . aspirin EC 81 MG tablet Take 1 tablet  (81 mg total) by mouth daily. 90 tablet 3  . atorvastatin (LIPITOR) 40 MG tablet Take 1 tablet (40 mg total) by mouth daily. 90 tablet 3  . doxylamine, Sleep, (UNISOM) 25 MG tablet Take 50 mg by mouth at bedtime.     Earney Navy Bicarbonate (ZEGERID OTC) 20-1100 MG CAPS capsule Take 1 capsule by mouth daily before breakfast.     Social History   Socioeconomic History  . Marital status: Widowed    Spouse name: Not on file  . Number of children: Not on file  . Years of education: Not on file  . Highest education level: Not on file  Occupational History  . Not on file  Tobacco Use  . Smoking status: Never Smoker  . Smokeless tobacco: Never Used  Vaping Use  . Vaping Use: Never used  Substance and Sexual Activity  . Alcohol use: No  . Drug use: No  . Sexual activity: Never  Other Topics Concern  . Not on file  Social History Narrative  . Not on file   Social Determinants of Health   Financial Resource Strain:   . Difficulty of Paying Living Expenses:   Food Insecurity:   . Worried About Charity fundraiser in the Last Year:   . Arboriculturist in the Last Year:   Transportation Needs:   .  Lack of Transportation (Medical):   Marland Kitchen Lack of Transportation (Non-Medical):   Physical Activity:   . Days of Exercise per Week:   . Minutes of Exercise per Session:   Stress:   . Feeling of Stress :   Social Connections:   . Frequency of Communication with Friends and Family:   . Frequency of Social Gatherings with Friends and Family:   . Attends Religious Services:   . Active Member of Clubs or Organizations:   . Attends Archivist Meetings:   Marland Kitchen Marital Status:   Intimate Partner Violence:   . Fear of Current or Ex-Partner:   . Emotionally Abused:   Marland Kitchen Physically Abused:   . Sexually Abused:    Family History  Problem Relation Age of Onset  . Stroke Mother   . Stroke Father   . Cancer Maternal Uncle   . Cancer Paternal Uncle   . Cancer Paternal Uncle      OBJECTIVE:  Vitals:   07/03/19 1913  BP: (!) 158/87  Pulse: 90  Resp: 17  Temp: 98 F (36.7 C)  TempSrc: Oral  SpO2: 98%    General appearance: alert; no distress Eyes: PERRLA; EOMI HENT: normocephalic; atraumatic Neck: supple with FROM Lungs: clear to auscultation bilaterally Heart: regular rate and rhythm.   Extremities: no edema; symmetrical with no gross deformities Skin: warm and dry Neurologic: normal gait Psychological: alert and cooperative; normal mood and affect   ASSESSMENT & PLAN:  1. Elevated blood pressure reading     Meds ordered this encounter  Medications  . amLODipine (NORVASC) 5 MG tablet    Sig: Take 1 tablet (5 mg total) by mouth daily.    Dispense:  30 tablet    Refill:  0    Order Specific Question:   Supervising Provider    Answer:   Raylene Everts [[id number]]   Blood pressure medication prescribed.  Take as directed Please continue to monitor blood pressure at home and keep a log Eat a well balanced diet of fruits, vegetables and lean meats.  Avoid foods high in fat and salt Drink water.  At least half your body weight in ounces Exercise for at least 30 minutes daily Follow up with PCP for further evaluation and management Return or go to the ED if you have any new or worsening symptoms such as vision changes, fatigue, dizziness, chest pain, shortness of breath, nausea, swelling in your hands or feet, urinary symptoms, etc...  Reviewed expectations re: course of current medical issues. Questions answered. Outlined signs and symptoms indicating need for more acute intervention. Patient verbalized understanding. After Visit Summary given.   Lestine Box, PA-C 07/03/19 1930

## 2019-07-04 ENCOUNTER — Telehealth: Payer: Self-pay | Admitting: Cardiovascular Disease

## 2019-07-04 NOTE — Telephone Encounter (Signed)
Reports being seen in ED for elevated BP on 06/30/2019 and seen at Urgent Care on yesterday. Reports she was started on amlodipine 5 mg daily and that she took her first pill last night. Today BP 141/88. Patient wanted to know the best time of day to take amlodipine and wanted to know if its okay to take amlodipine. Advised that amlodipine is safe to take and that she needed to take it at the same time each day whether its morning or evening. Advised to continue monitoring her home BP and contact our office if her BP stays above 130/80 consistently. Verbalized understanding of plan.

## 2019-07-04 NOTE — Telephone Encounter (Signed)
Pt states recently her BP has been high   Please call  [phone]   Thanks renee

## 2019-07-05 NOTE — Telephone Encounter (Signed)
I agree with your assessment and plan  J Kyisha Fowle MD 

## 2019-08-02 ENCOUNTER — Other Ambulatory Visit (HOSPITAL_COMMUNITY): Payer: Self-pay | Admitting: Internal Medicine

## 2019-08-02 DIAGNOSIS — Z1231 Encounter for screening mammogram for malignant neoplasm of breast: Secondary | ICD-10-CM

## 2019-08-03 ENCOUNTER — Encounter (INDEPENDENT_AMBULATORY_CARE_PROVIDER_SITE_OTHER): Payer: Self-pay | Admitting: *Deleted

## 2019-08-09 DIAGNOSIS — I10 Essential (primary) hypertension: Secondary | ICD-10-CM | POA: Diagnosis not present

## 2019-08-10 ENCOUNTER — Ambulatory Visit (INDEPENDENT_AMBULATORY_CARE_PROVIDER_SITE_OTHER): Payer: Medicare Other | Admitting: Gastroenterology

## 2019-08-10 ENCOUNTER — Other Ambulatory Visit (INDEPENDENT_AMBULATORY_CARE_PROVIDER_SITE_OTHER): Payer: Self-pay | Admitting: *Deleted

## 2019-08-10 ENCOUNTER — Encounter (INDEPENDENT_AMBULATORY_CARE_PROVIDER_SITE_OTHER): Payer: Self-pay | Admitting: Gastroenterology

## 2019-08-10 ENCOUNTER — Telehealth (INDEPENDENT_AMBULATORY_CARE_PROVIDER_SITE_OTHER): Payer: Self-pay | Admitting: *Deleted

## 2019-08-10 ENCOUNTER — Encounter (INDEPENDENT_AMBULATORY_CARE_PROVIDER_SITE_OTHER): Payer: Self-pay | Admitting: *Deleted

## 2019-08-10 ENCOUNTER — Other Ambulatory Visit: Payer: Self-pay

## 2019-08-10 VITALS — BP 151/77 | HR 80 | Temp 98.1°F | Ht 66.5 in | Wt 158.4 lb

## 2019-08-10 DIAGNOSIS — K219 Gastro-esophageal reflux disease without esophagitis: Secondary | ICD-10-CM

## 2019-08-10 DIAGNOSIS — Z7189 Other specified counseling: Secondary | ICD-10-CM

## 2019-08-10 MED ORDER — DEXLANSOPRAZOLE 60 MG PO CPDR
60.0000 mg | DELAYED_RELEASE_CAPSULE | Freq: Every day | ORAL | 11 refills | Status: DC
Start: 2019-08-10 — End: 2021-02-06

## 2019-08-10 NOTE — Patient Instructions (Signed)
We are switching from Zegrid to a trial of Dexilant.  Please contact me if you have issues getting the [place].

## 2019-08-10 NOTE — Progress Notes (Signed)
Patient profile: Laurie Bridges is a 74 y.o. female seen for follow-up, last seen November 2020 for gallstones, she was referred to Dr. Constance Haw for consideration of surgery.  At that point symptoms were mainly vomiting and bloating intermittently and not felt 100% related to gallbladder and recommended monitoring clinical course.   History of Present Illness: Laurie Bridges is seen today for follow up. Reports continuing to have intermittent episodes of vomiting (last occurred last week following tomato sandwhich and blueberry cobbler). Vomiting is described as volume regurgitation more than nausea leading to vomiting. She denies nausea. Currently having some reflux after banana sandwhich and cantelope. Tolerates greasy foods okay. No abd pain w/ symptoms  Takes miralax on as needed basis, other days uses coffee for bowel regularity. Generally having Bm 5x/week. Denies diarrhea. No rectal bleeding. Uses miralax on average few days a month.    She is currently on zegerid, has tried nexium and omeprazole for GERD as well.    Wt Readings from Last 3 Encounters:  08/10/19 158 lb 6.4 oz (71.8 kg)  06/30/19 158 lb (71.7 kg)  12/27/18 158 lb (71.7 kg)     Last Colonoscopy: 07/2009-normal Last Endoscopy: 11/2017-Normal proximal esophagus and mid esophagus.  - Benign-appearing mild esophageal stenosis proxima  to GEJ. Dilated.  - Z-line regular, 36 cm from the incisors. - 3 cm hiatal hernia.  - Normal stomach. - Normal duodenal bulb and second portion of the duodenum.  - No specimens collected.     Past Medical History:  Past Medical History:  Diagnosis Date  . Anemia    hx of Fe def many years ago  . Anxiety   . Arthritis    back  . GERD (gastroesophageal reflux disease)   . Hypertension   . PONV (postoperative nausea and vomiting)     Problem List: Patient Active Problem List   Diagnosis Date Noted  . Gallstones 11/17/2018  . Gastroesophageal reflux disease without  esophagitis 09/14/2017  . Esophageal dysphagia 09/14/2017  . Dyspnea 08/02/2013  . GERD (gastroesophageal reflux disease) 08/02/2013    Past Surgical History: Past Surgical History:  Procedure Laterality Date  . ABDOMINAL HYSTERECTOMY     partial hysterectomy, ovaries remain  . BACK SURGERY    . ESOPHAGEAL DILATION N/A 11/17/2017   Procedure: ESOPHAGEAL DILATION;  Surgeon: Rogene Houston, MD;  [facility];  Service: Endoscopy;  Laterality: N/A;  . ESOPHAGOGASTRODUODENOSCOPY N/A 11/17/2017   Procedure: ESOPHAGOGASTRODUODENOSCOPY (EGD);  Surgeon: Rogene Houston, MD;  [facility]: [facility];  Service: Endoscopy;  Laterality: N/A;  12:00  . FOOT SURGERY      Allergies: Allergies  Allergen Reactions  . Codeine Other (See Comments)    Syncope   . Demerol Rash  . Feldene [Piroxicam] Rash  . Latex Rash  . Pyridium [Phenazopyridine Hcl] Itching and Rash  . Sulfur Rash      Home Medications:  Current Outpatient Medications:  .  amLODipine (NORVASC) 5 MG tablet, Take 1 tablet (5 mg total) by mouth daily., Disp: 30 tablet, Rfl: 0 .  aspirin EC 81 MG tablet, Take 1 tablet (81 mg total) by mouth daily., Disp: 90 tablet, Rfl: 3 .  doxylamine, Sleep, (UNISOM) 25 MG tablet, Take 50 mg by mouth at bedtime. , Disp: , Rfl:  .  levocetirizine (XYZAL) 5 MG tablet, Take 5 mg by mouth at bedtime., Disp: , Rfl:  .  Omeprazole-Sodium Bicarbonate (ZEGERID OTC) 20-1100 MG CAPS capsule, Take 1 capsule by mouth  daily before breakfast., Disp: , Rfl:  .  atorvastatin (LIPITOR) 40 MG tablet, Take 1 tablet (40 mg total) by mouth daily., Disp: 90 tablet, Rfl: 3 .  dexlansoprazole (DEXILANT) 60 MG capsule, Take 1 capsule (60 mg total) by mouth daily., Disp: 30 capsule, Rfl: 11   Family History: family history includes Cancer in her maternal uncle, paternal uncle, and paternal uncle; Stroke in her father and mother.    Social History:   reports that she has never smoked. She has never used  smokeless tobacco. She reports that she does not drink alcohol and does not use drugs.   Review of Systems: Constitutional: Denies weight loss/weight gain  Eyes: No changes in vision. ENT: No oral lesions, sore throat.  GI: see HPI.  Heme/Lymph: No easy bruising.  CV: No chest pain.  GU: No hematuria.  Integumentary: No rashes.  Neuro: No headaches.  Psych: No depression/anxiety.  Endocrine: No heat/cold intolerance.  Allergic/Immunologic: No urticaria.  Resp: No cough, SOB.  Musculoskeletal: No joint swelling.    Physical Examination: BP (!) 151/77 (BP Location: Right Arm, Patient Position: Sitting, Cuff Size: Normal)   Pulse 80   Temp 98.1 F (36.7 C) (Oral)   Ht 5' 6.5" (1.689 m)   Wt 158 lb 6.4 oz (71.8 kg)   BMI 25.18 kg/m  Gen: NAD, alert and oriented x 4 HEENT: PEERLA, EOMI, Neck: supple, no JVD Chest: CTA bilaterally, no wheezes, crackles, or other adventitious sounds CV: RRR, no m/g/c/r Abd: soft, NT, ND, +BS in all four quadrants; no HSM, guarding, ridigity, or rebound tenderness Ext: no edema, well perfused with 2+ pulses, Skin: no rash or lesions noted on observed skin Lymph: no noted LAD  Data Reviewed:  11/2018-IMPRESSION: 1. Single small gallstone, unchanged. 2. Stable 1 cm lesion in the right lobe of the liver, probably a cyst as demonstrated on the prior CT scan of 12/07/2017. 3. No acute abnormalities.   Assessment/Plan: Ms. Defilippo is a 74 y.o. female   1. Vomiting -seems more consistent from her acid reflux leading to regurgitation.  She has no right upper quadrant or epigastric pain to suggest gallbladder etiology of symptoms.  We will try switching her PPI given she is symptomatic despite zegrid. Has clear trigger foods which she will avoid.  To notify me if continues to have symptoms with Dexilant.  She reports being on other PPIs in the past without improvement.  2.  Colorectal cancer screening-colonoscopy last done in 2011, no lower GI  symptoms except baseline chronic constipation which is well controlled.  Will arrange screening colonoscopy.  Denies prior issues with sedation.  Patient denies CP, SOB, and use of blood thinners. I discussed the risks and benefits of procedure including bleeding, perforation, infection, missed lesions, medication reactions and possible hospitalization or surgery if complications. All questions answered.   Laurie Bridges was seen today for follow-up.  Diagnoses and all orders for this visit:  Counseling and coordination of care  Gastroesophageal reflux disease without esophagitis  Other orders -     dexlansoprazole (DEXILANT) 60 MG capsule; Take 1 capsule (60 mg total) by mouth daily.        I personally performed the service, non-incident to. (WP)  Laurine Blazer, [facility]

## 2019-08-10 NOTE — Telephone Encounter (Signed)
Patient needs Plenvu (copay card) ° °

## 2019-08-11 DIAGNOSIS — H1045 Other chronic allergic conjunctivitis: Secondary | ICD-10-CM | POA: Diagnosis not present

## 2019-08-11 DIAGNOSIS — H2513 Age-related nuclear cataract, bilateral: Secondary | ICD-10-CM | POA: Diagnosis not present

## 2019-08-11 DIAGNOSIS — H35433 Paving stone degeneration of retina, bilateral: Secondary | ICD-10-CM | POA: Diagnosis not present

## 2019-08-11 DIAGNOSIS — H25013 Cortical age-related cataract, bilateral: Secondary | ICD-10-CM | POA: Diagnosis not present

## 2019-08-11 MED ORDER — PLENVU 140 G PO SOLR: 1.0000 | Freq: Once | ORAL | 0 refills | Status: AC

## 2019-08-15 DIAGNOSIS — L818 Other specified disorders of pigmentation: Secondary | ICD-10-CM | POA: Diagnosis not present

## 2019-08-15 DIAGNOSIS — L820 Inflamed seborrheic keratosis: Secondary | ICD-10-CM | POA: Diagnosis not present

## 2019-08-15 DIAGNOSIS — L708 Other acne: Secondary | ICD-10-CM | POA: Diagnosis not present

## 2019-08-21 NOTE — Progress Notes (Signed)
Cardiology Office Note    Date:  08/22/2019   ID:  Laurie Bridges, DOB 11-07-1945, MRN [id number]  PCP:  Asencion Noble, MD  Cardiologist: Kate Sable, MD (Inactive)    Chief Complaint  Patient presents with  . Follow-up    Annual Visit    History of Present Illness:    Laurie Bridges is a 74 y.o. female with past medical history of CAD (s/p Coronary CT in 06/2018 showing mild stenosis along the mid-LAD, mid-LCx and mid-RCA), HTN and mitral regurgitation who presents to the office today for annual follow-up.  She most recently had a phone visit with Dr. Bronson Ing in 12/2018 and reported occasional dyspnea when climbing steps but denied any associated chest pain or palpitations. She was continued on ASA 81mg  daily and Atorvastatin 40mg  daily.   In the interim, she was evaluated at Urgent Care in 06/2019 for elevated BP and was started on Amlodipine 5mg  daily.   In talking with the patient today, she reports overall doing well from a cardiac perspective since her last visit. She denies any recent exertional chest pain or dyspnea on exertion. She still enjoys bowling in a league on a weekly basis. She does describe episodes of acid reflux as she develops discomfort after food consumption. She is on Dexilant 60 mg daily.  She denies any recent orthopnea, PND or lower extremity edema. She does report occasional cramps and muscle aches along her lower extremities. She had lab work with her PCP several months ago and says that her cholesterol was essentially unchanged following prior dose adjustment of Atorvastatin. Feels like the stress of losing her close friend earlier this year might have contributed to her recently elevated BP.    Past Medical History:  Diagnosis Date  . Anemia    hx of Fe def many years ago  . Anxiety   . Arthritis    back  . CAD (coronary artery disease)    a. s/p Coronary CT in 06/2018 showing mild stenosis along the mid-LAD, mid-LCx and mid-RCA    . GERD (gastroesophageal reflux disease)   . Hypertension   . PONV (postoperative nausea and vomiting)     Past Surgical History:  Procedure Laterality Date  . ABDOMINAL HYSTERECTOMY     partial hysterectomy, ovaries remain  . BACK SURGERY    . ESOPHAGEAL DILATION N/A 11/17/2017   Procedure: ESOPHAGEAL DILATION;  Surgeon: Rogene Houston, MD;  [facility];  Service: Endoscopy;  Laterality: N/A;  . ESOPHAGOGASTRODUODENOSCOPY N/A 11/17/2017   Procedure: ESOPHAGOGASTRODUODENOSCOPY (EGD);  Surgeon: Rogene Houston, MD;  [facility];  Service: Endoscopy;  Laterality: N/A;  12:00  . FOOT SURGERY      Current Medications: Outpatient Medications Prior to Visit  Medication Sig Dispense Refill  . amLODipine (NORVASC) 5 MG tablet Take 1 tablet (5 mg total) by mouth daily. 30 tablet 0  . aspirin EC 81 MG tablet Take 1 tablet (81 mg total) by mouth daily. 90 tablet 3  . atorvastatin (LIPITOR) 40 MG tablet Take 1 tablet (40 mg total) by mouth daily. 90 tablet 3  . dexlansoprazole (DEXILANT) 60 MG capsule Take 1 capsule (60 mg total) by mouth daily. 30 capsule 11  . doxylamine, Sleep, (UNISOM) 25 MG tablet Take 50 mg by mouth at bedtime.     . hydroquinone 4 % cream Apply 1 application topically 2 (two) times daily as needed.    Marland Kitchen levocetirizine (XYZAL) 5 MG tablet Take 5 mg  by mouth at bedtime.    Earney Navy Bicarbonate (ZEGERID OTC) 20-1100 MG CAPS capsule Take 1 capsule by mouth daily before breakfast. (Patient not taking: Reported on 08/22/2019)     No facility-administered medications prior to visit.     Allergies:   Codeine, Demerol, Feldene [piroxicam], Latex, Pyridium [phenazopyridine hcl], and Sulfur   Social History   Socioeconomic History  . Marital status: Widowed    Spouse name: Not on file  . Number of children: Not on file  . Years of education: Not on file  . Highest education level: Not on file  Occupational History  . Not on file   Tobacco Use  . Smoking status: Never Smoker  . Smokeless tobacco: Never Used  Vaping Use  . Vaping Use: Never used  Substance and Sexual Activity  . Alcohol use: No  . Drug use: No  . Sexual activity: Never  Other Topics Concern  . Not on file  Social History Narrative  . Not on file   Social Determinants of Health   Financial Resource Strain:   . Difficulty of Paying Living Expenses:   Food Insecurity:   . Worried About Charity fundraiser in the Last Year:   . Arboriculturist in the Last Year:   Transportation Needs:   . Film/video editor (Medical):   Marland Kitchen Lack of Transportation (Non-Medical):   Physical Activity:   . Days of Exercise per Week:   . Minutes of Exercise per Session:   Stress:   . Feeling of Stress :   Social Connections:   . Frequency of Communication with Friends and Family:   . Frequency of Social Gatherings with Friends and Family:   . Attends Religious Services:   . Active Member of Clubs or Organizations:   . Attends Archivist Meetings:   Marland Kitchen Marital Status:      Family History:  The patient's family history includes Cancer in her maternal uncle, paternal uncle, and paternal uncle; Stroke in her father and mother.   Review of Systems:   Please see the history of present illness.     General:  No chills, fever, night sweats or weight changes.  Cardiovascular:  No dyspnea on exertion, edema, orthopnea, palpitations, paroxysmal nocturnal dyspnea. Dermatological: No rash, lesions/masses Respiratory: No cough, dyspnea Urologic: No hematuria, dysuria Abdominal:   No nausea, vomiting, diarrhea, bright red blood per rectum, melena, or hematemesis. Positive for sternal discomfort.  Neurologic:  No visual changes, wkns, changes in mental status.  All other systems reviewed and are otherwise negative except as noted above.   Physical Exam:    VS:  BP 127/79   Pulse 81   Ht 5' 6.5" (1.689 m)   Wt 159 lb 6.4 oz (72.3 kg)   SpO2 98%    BMI 25.34 kg/m    General: Well developed, well nourished,female appearing in no acute distress. Head: Normocephalic, atraumatic. Neck: No carotid bruits. JVD not elevated.  Lungs: Respirations regular and unlabored, without wheezes or rales.  Heart: Regular rate and rhythm. No S3 or S4.  No murmur, no rubs, or gallops appreciated. Abdomen: Appears non-distended. No obvious abdominal masses. Msk:  Strength and tone appear normal for age. No obvious joint deformities or effusions. Extremities: No clubbing or cyanosis. No lower extremity edema.  Distal pedal pulses are 2+ bilaterally. Neuro: Alert and oriented X 3. Moves all extremities spontaneously. No focal deficits noted. Psych:  Responds to questions appropriately with a normal affect.  Skin: No rashes or lesions noted  Wt Readings from Last 3 Encounters:  08/22/19 159 lb 6.4 oz (72.3 kg)  08/10/19 158 lb 6.4 oz (71.8 kg)  06/30/19 158 lb (71.7 kg)     Studies/Labs Reviewed:   EKG:  EKG is ordered today.  The ekg ordered today demonstrates NSR, HR 81 with no acute ST abnormalities when compared to prior tracings.   Recent Labs: 11/17/2018: Hemoglobin 13.9; Platelets 307   Lipid Panel No results found for: CHOL, TRIG, HDL, CHOLHDL, VLDL, LDLCALC, LDLDIRECT  Additional studies/ records that were reviewed today include:   Echocardiogram: 05/2018 IMPRESSIONS    1. The left ventricle has normal systolic function, with an ejection  fraction of 55-60%. The cavity size was normal. Left ventricular diastolic  Doppler parameters are consistent with impaired relaxation.  2. The right ventricle has normal systolic function. The cavity was  normal. There is no increase in right ventricular wall thickness.  3. No evidence of mitral valve stenosis.  4. The aortic valve is tricuspid. No stenosis of the aortic valve.  5. The aortic root is normal in size and structure.   Coronary CT: 06/2018 Coronary Arteries: Right dominant with  no anomalies  LM: No plaque or stenosis.  LAD system: Mixed plaque mid LAD with mild (<50%) stenosis. Misregistration artifact affects the mid LAD.  Circumflex system: Mixed plaque in the mid LCx, mild (<50%) stenosis.  RCA system: Calcified plaque with minimal stenosis mid RCA. There was misregistration artifact in the mid RCA. Mixed plaque with mild (<50%) stenosis in the distal RCA.  IMPRESSION: 1. Coronary artery calcium score 234 Agatston units. This places the patient in the 80th percentile for age and gender, suggesting high risk for future cardiac events.  2. There appears to be mild stenosis in the mid LAD, mid LCX, and mid RCA.   Assessment:    1. Coronary artery disease involving native coronary artery of native heart without angina pectoris   2. Essential hypertension   3. Hyperlipidemia LDL goal <70   4. Mitral valve insufficiency, unspecified etiology      Plan:   In order of problems listed above:  1. CAD - Prior Coronary CT in 06/2018 showed mild stenosis along the mid-LAD, mid-LCx and mid-RCA as outlined above. She does describe some episodes of chest discomfort which occur after food consumption and resemble her prior reflux. No recent exertional chest pain. EKG today is without acute ST changes.   - Continue with risk factor modification. She remains on ASA 81 mg daily and Atorvastatin 40 mg daily.  2. HTN - BP is well controlled at 127/79 during today's visit.  She reports elevated readings at home and has been using a wrist cuff.  Recommended using an arm cuff for a more accurate assessment. Continue Amlodipine 5 mg daily  3. HLD - LDL was previously 87 in 03/2018. Remains on Atorvastatin 40mg  daily.  I will request a copy of recent labs from her PCP. She reports she initially did not tolerate the titration of Atorvastatin from 20 mg daily to 40 mg daily and is concerned about being on a higher dose of the medication. If LDL remains above 70,  would recommend switching Atorvastatin to Crestor.   4. Mitral Regurgitation - She had mild MR by echocardiogram in 05/2018. Will continue to follow.    Medication Adjustments/Labs and Tests Ordered: Current medicines are reviewed at length with the patient today.  Concerns regarding medicines are outlined above.  Medication  changes, Labs and Tests ordered today are listed in the Patient Instructions below. Patient Instructions  Medication Instructions:  Your physician recommends that you continue on your current medications as directed. Please refer to the Current Medication list given to you today.  *If you need a refill on your cardiac medications before your next appointment, please call your pharmacy*   Lab Work: NONE   If you have labs (blood work) drawn today and your tests are completely normal, you will receive your results only by: Marland Kitchen MyChart Message (if you have MyChart) OR . A paper copy in the mail If you have any lab test that is abnormal or we need to change your treatment, we will call you to review the results.   Testing/Procedures: NONE    Follow-Up: At [facility], you and your health needs are our priority.  As part of our continuing mission to provide you with exceptional heart care, we have created designated Provider Care Teams.  These Care Teams include your primary Cardiologist (physician) and Advanced Practice Providers (APPs -  Physician Assistants and Nurse Practitioners) who all work together to provide you with the care you need, when you need it.  We recommend signing up for the patient portal called "MyChart".  Sign up information is provided on this After Visit Summary.  MyChart is used to connect with patients for Virtual Visits (Telemedicine).  Patients are able to view lab/test results, encounter notes, upcoming appointments, etc.  Non-urgent messages can be sent to your provider as well.   To learn more about what you can do with MyChart, go to  [email or web address].    Your next appointment:   1 year(s)  The format for your next appointment:   In Person  Provider:   Rozann Lesches, MD   Other Instructions Thank you for choosing [facility]!       Signed, DERICA LEIBER, PA-C  08/22/2019 5:24 PM    [place] [address] [place], [place] [address] Phone: [phone] Fax: [phone]

## 2019-08-22 ENCOUNTER — Encounter: Payer: Self-pay | Admitting: *Deleted

## 2019-08-22 ENCOUNTER — Encounter: Payer: Self-pay | Admitting: Student

## 2019-08-22 ENCOUNTER — Ambulatory Visit (INDEPENDENT_AMBULATORY_CARE_PROVIDER_SITE_OTHER): Payer: Medicare Other | Admitting: Student

## 2019-08-22 ENCOUNTER — Other Ambulatory Visit: Payer: Self-pay

## 2019-08-22 VITALS — BP 127/79 | HR 81 | Ht 66.5 in | Wt 159.4 lb

## 2019-08-22 DIAGNOSIS — E785 Hyperlipidemia, unspecified: Secondary | ICD-10-CM

## 2019-08-22 DIAGNOSIS — I2510 Atherosclerotic heart disease of native coronary artery without angina pectoris: Secondary | ICD-10-CM

## 2019-08-22 DIAGNOSIS — I340 Nonrheumatic mitral (valve) insufficiency: Secondary | ICD-10-CM | POA: Diagnosis not present

## 2019-08-22 DIAGNOSIS — I10 Essential (primary) hypertension: Secondary | ICD-10-CM

## 2019-08-22 NOTE — Patient Instructions (Signed)
Medication Instructions:  Your physician recommends that you continue on your current medications as directed. Please refer to the Current Medication list given to you today.  *If you need a refill on your cardiac medications before your next appointment, please call your pharmacy*   Lab Work: NONE   If you have labs (blood work) drawn today and your tests are completely normal, you will receive your results only by: MyChart Message (if you have MyChart) OR A paper copy in the mail If you have any lab test that is abnormal or we need to change your treatment, we will call you to review the results.   Testing/Procedures: NONE    Follow-Up: At [facility], you and your health needs are our priority.  As part of our continuing mission to provide you with exceptional heart care, we have created designated Provider Care Teams.  These Care Teams include your primary Cardiologist (physician) and Advanced Practice Providers (APPs -  Physician Assistants and Nurse Practitioners) who all work together to provide you with the care you need, when you need it.  We recommend signing up for the patient portal called "MyChart".  Sign up information is provided on this After Visit Summary.  MyChart is used to connect with patients for Virtual Visits (Telemedicine).  Patients are able to view lab/test results, encounter notes, upcoming appointments, etc.  Non-urgent messages can be sent to your provider as well.   To learn more about what you can do with MyChart, go to [email or web address].    Your next appointment:   1 year(s)  The format for your next appointment:   In Person  Provider:   Samuel McDowell, MD   Other Instructions Thank you for choosing [facility]!    

## 2019-08-24 ENCOUNTER — Telehealth: Payer: Self-pay | Admitting: Student

## 2019-08-24 DIAGNOSIS — Z79899 Other long term (current) drug therapy: Secondary | ICD-10-CM

## 2019-08-24 MED ORDER — ROSUVASTATIN CALCIUM 40 MG PO TABS
40.0000 mg | ORAL_TABLET | Freq: Every day | ORAL | 3 refills | Status: DC
Start: 2019-08-24 — End: 2020-08-29

## 2019-08-24 NOTE — Telephone Encounter (Signed)
Patient read MyChart message.We will d/c  Lipitor and start Crestor 40 mg daily. Mailed lab slip to patient

## 2019-08-24 NOTE — Telephone Encounter (Signed)
Pt rec'd call TX:[id number] changes    Please call [phone]   Thanks renee

## 2019-08-29 ENCOUNTER — Ambulatory Visit: Payer: Medicare Other | Admitting: Family Medicine

## 2019-09-04 ENCOUNTER — Ambulatory Visit (HOSPITAL_COMMUNITY): Payer: Medicare Other

## 2019-09-13 DIAGNOSIS — E785 Hyperlipidemia, unspecified: Secondary | ICD-10-CM | POA: Diagnosis not present

## 2019-09-13 DIAGNOSIS — I10 Essential (primary) hypertension: Secondary | ICD-10-CM | POA: Diagnosis not present

## 2019-09-14 ENCOUNTER — Ambulatory Visit (HOSPITAL_COMMUNITY)
Admission: RE | Admit: 2019-09-14 | Discharge: 2019-09-14 | Disposition: A | Discharge status: Home / Self Care | Payer: Medicare Other | Source: Ambulatory Visit | Attending: Internal Medicine | Admitting: Internal Medicine

## 2019-09-14 ENCOUNTER — Other Ambulatory Visit: Payer: Self-pay

## 2019-09-14 DIAGNOSIS — Z1231 Encounter for screening mammogram for malignant neoplasm of breast: Secondary | ICD-10-CM | POA: Insufficient documentation

## 2019-10-04 DIAGNOSIS — J342 Deviated nasal septum: Secondary | ICD-10-CM | POA: Diagnosis not present

## 2019-10-04 DIAGNOSIS — K219 Gastro-esophageal reflux disease without esophagitis: Secondary | ICD-10-CM | POA: Diagnosis not present

## 2019-10-26 ENCOUNTER — Encounter (INDEPENDENT_AMBULATORY_CARE_PROVIDER_SITE_OTHER): Payer: Self-pay | Admitting: Gastroenterology

## 2019-10-26 ENCOUNTER — Ambulatory Visit (INDEPENDENT_AMBULATORY_CARE_PROVIDER_SITE_OTHER): Payer: Medicare Other | Admitting: Gastroenterology

## 2019-10-26 ENCOUNTER — Other Ambulatory Visit: Payer: Self-pay

## 2019-10-26 ENCOUNTER — Encounter (INDEPENDENT_AMBULATORY_CARE_PROVIDER_SITE_OTHER): Payer: Self-pay | Admitting: *Deleted

## 2019-10-26 VITALS — BP 123/77 | HR 73 | Temp 96.9°F | Ht 66.5 in | Wt 156.6 lb

## 2019-10-26 DIAGNOSIS — R112 Nausea with vomiting, unspecified: Secondary | ICD-10-CM

## 2019-10-26 DIAGNOSIS — E785 Hyperlipidemia, unspecified: Secondary | ICD-10-CM | POA: Diagnosis not present

## 2019-10-26 NOTE — Patient Instructions (Signed)
We are scheduling endoscopy for evaluation-will hold off on colonoscopy checking labs today

## 2019-10-26 NOTE — Progress Notes (Signed)
Patient profile: Laurie Bridges is a 74 y.o. female seen for follow up - last seen 08/10/19 to schedule screening colonoscopy. PMHx of GERD and known hx of cholelithiasis.   History of Present Illness: Laurie Bridges is seen today for nausea/vomiting.  She reports this has worsened since her last visit 2.5 months ago.  Had time period 3 weeks ago lasting for 2 weeks where she had daily vomiting immediately after meals.  Feels usually has to regurgitate meals anywhere from a few minutes to several hours after eating, keeps trashcan close by due to volume regurgitation.  She put herself on a very bland diet and has not had any vomiting over the past 1 week.  She does not have significant abdominal pain with the symptoms but did have some milder "twinges of abdominal pain" possibly most notable in the right upper quadrant.  She felt queasy but does not have any specific GERD symptoms.  She was seen by ENT and felt sinus related issues were related to reflux.  She does not feel the Dexilant helps more than zegrid.  She does not have dysphagia. She tries to remain upright after meals.   Bowels are typically daily with taking MiraLAX daily and drinking 1 cup of coffee.  She denies any blood in stool.  Denies any lower abdominal pain or melena.  Wt Readings from Last 3 Encounters:  10/26/19 156 lb 9.6 oz (71 kg)  08/22/19 159 lb 6.4 oz (72.3 kg)  08/10/19 158 lb 6.4 oz (71.8 kg)      Last Colonoscopy: 07/2009-normal Last Endoscopy: 11/2017-Normal proximal esophagus and mid esophagus. - Benign-appearing mild esophageal stenosis proxima to GEJ. Dilated. - Z-line regular, 36 cm from the incisors. - 3 cm hiatal hernia. - Normal stomach. - Normal duodenal bulb and second portion of theduodenum. - No specimens collected.   Past Medical History:  Past Medical History:  Diagnosis Date  . Anemia    hx of Fe def many years ago  . Anxiety   . Arthritis    back  . CAD (coronary artery  disease)    a. s/p Coronary CT in 06/2018 showing mild stenosis along the mid-LAD, mid-LCx and mid-RCA  . GERD (gastroesophageal reflux disease)   . Hypertension   . PONV (postoperative nausea and vomiting)     Problem List: Patient Active Problem List   Diagnosis Date Noted  . Gallstones 11/17/2018  . Gastroesophageal reflux disease without esophagitis 09/14/2017  . Esophageal dysphagia 09/14/2017  . Dyspnea 08/02/2013  . GERD (gastroesophageal reflux disease) 08/02/2013    Past Surgical History: Past Surgical History:  Procedure Laterality Date  . ABDOMINAL HYSTERECTOMY     partial hysterectomy, ovaries remain  . BACK SURGERY    . ESOPHAGEAL DILATION N/A 11/17/2017   Procedure: ESOPHAGEAL DILATION;  Surgeon: Rogene Houston, MD;  [facility];  Service: Endoscopy;  Laterality: N/A;  . ESOPHAGOGASTRODUODENOSCOPY N/A 11/17/2017   Procedure: ESOPHAGOGASTRODUODENOSCOPY (EGD);  Surgeon: Rogene Houston, MD;  [facility];  Service: Endoscopy;  Laterality: N/A;  12:00  . FOOT SURGERY      Allergies: Allergies  Allergen Reactions  . Codeine Other (See Comments)    Syncope   . Demerol Rash  . Feldene [Piroxicam] Rash  . Latex Rash  . Pyridium [Phenazopyridine Hcl] Itching and Rash  . Sulfur Rash      Home Medications:  Current Outpatient Medications:  .  amLODipine (NORVASC) 5 MG tablet, Take 1 tablet (5  mg total) by mouth daily. (Patient taking differently: Take 5 mg by mouth at bedtime. ), Disp: 30 tablet, Rfl: 0 .  aspirin EC 81 MG tablet, Take 1 tablet (81 mg total) by mouth daily. (Patient taking differently: Take 81 mg by mouth at bedtime. ), Disp: 90 tablet, Rfl: 3 .  dexlansoprazole (DEXILANT) 60 MG capsule, Take 1 capsule (60 mg total) by mouth daily., Disp: 30 capsule, Rfl: 11 .  diphenhydrAMINE HCl, Sleep, (UNISOM SLEEPGELS) 50 MG CAPS, Take 50 mg by mouth at bedtime., Disp: , Rfl:  .  hydroquinone 4 % cream, Apply 1 application topically in  the morning and at bedtime. Applied to dark spots, Disp: , Rfl:  .  levocetirizine (XYZAL) 5 MG tablet, Take 5 mg by mouth at bedtime., Disp: , Rfl:  .  rosuvastatin (CRESTOR) 40 MG tablet, Take 1 tablet (40 mg total) by mouth daily. (Patient taking differently: Take 40 mg by mouth at bedtime. ), Disp: 90 tablet, Rfl: 3   Family History: family history includes Cancer in her maternal uncle, paternal uncle, and paternal uncle; Stroke in her father and mother.    Social History:   reports that she has never smoked. She has never used smokeless tobacco. She reports that she does not drink alcohol and does not use drugs.   Review of Systems: Constitutional: Denies weight loss/weight gain  Eyes: No changes in vision. ENT: No oral lesions, sore throat.  GI: see HPI.  Heme/Lymph: No easy bruising.  CV: No chest pain.  GU: No hematuria.  Integumentary: No rashes.  Neuro: No headaches.  Psych: No depression/anxiety.  Endocrine: No heat/cold intolerance.  Allergic/Immunologic: No urticaria.  Resp: No cough, SOB.  Musculoskeletal: No joint swelling.    Physical Examination: BP 123/77 (BP Location: Right Arm, Patient Position: Sitting, Cuff Size: Normal)   Pulse 73   Temp (!) 96.9 F (36.1 C) (Temporal)   Ht 5' 6.5" (1.689 m)   Wt 156 lb 9.6 oz (71 kg)   BMI 24.90 kg/m  Gen: NAD, alert and oriented x 4 HEENT: PEERLA, EOMI, Neck: supple, no JVD Chest: CTA bilaterally, no wheezes, crackles, or other adventitious sounds CV: RRR, no m/g/c/r Abd: soft, NT specifically no RUQ TTP, ND, +BS in all four quadrants; no HSM, guarding, ridigity, or rebound tenderness Ext: no edema, well perfused with 2+ pulses, Skin: no rash or lesions noted on observed skin Lymph: no noted LAD  Data Reviewed:   April 2021 labs reviewed.  Assessment/Plan: [name] is a 74 y.o. female seen for worsening nausea  1.  Nausea vomiting/GERD-describes volume reflux and regurgitation.  No severe abdominal  pain.  Has known cholelithiasis on imaging.  She is currently scheduled for colonoscopy next week but does not feel she would tolerate the prep due to the upper GI symptoms.  She would like to arrange an endoscopy instead and schedule colonoscopy at a later time when N/V/Regurgitation symptoms are improved.  Denies prior issues with sedation.  Offered antiemetic but she has some Zofran at home. Will check basic labs for evaluation. Last EGD w/ 3cm HH 2019.   2. HLD - scheduled for lipid panel this week through cardiology, add on to today's labs and will froward result to cardiology   Etrulia was seen today for nausea.  Diagnoses and all orders for this visit:  Nausea and vomiting, intractability of vomiting not specified, unspecified vomiting type -     CBC with Differential -     COMPLETE METABOLIC  PANEL WITH GFR -     Lipase  Hyperlipidemia, unspecified hyperlipidemia type -     Lipid Profile        I personally performed the service, non-incident to. (WP)  Laurine Blazer, [facility]

## 2019-10-27 ENCOUNTER — Other Ambulatory Visit (INDEPENDENT_AMBULATORY_CARE_PROVIDER_SITE_OTHER): Payer: Self-pay

## 2019-10-27 ENCOUNTER — Telehealth (INDEPENDENT_AMBULATORY_CARE_PROVIDER_SITE_OTHER): Payer: Self-pay

## 2019-10-27 LAB — CBC WITH DIFFERENTIAL/PLATELET
Absolute Monocytes: 647 cells/uL (ref 200–950)
Basophils Absolute: 61 cells/uL (ref 0–200)
Basophils Relative: 1 %
Eosinophils Absolute: 372 cells/uL (ref 15–500)
Eosinophils Relative: 6.1 %
HCT: 40.8 % (ref 35.0–45.0)
Hemoglobin: 13.8 g/dL (ref 11.7–15.5)
Lymphs Abs: 1671 cells/uL (ref 850–3900)
MCH: 29.1 pg (ref 27.0–33.0)
MCHC: 33.8 g/dL (ref 32.0–36.0)
MCV: 86.1 fL (ref 80.0–100.0)
MPV: 9.8 fL (ref 7.5–12.5)
Monocytes Relative: 10.6 %
Neutro Abs: 3349 cells/uL (ref 1500–7800)
Neutrophils Relative %: 54.9 %
Platelets: 312 10*3/uL (ref 140–400)
RBC: 4.74 10*6/uL (ref 3.80–5.10)
RDW: 12.9 % (ref 11.0–15.0)
Total Lymphocyte: 27.4 %
WBC: 6.1 10*3/uL (ref 3.8–10.8)

## 2019-10-27 LAB — COMPLETE METABOLIC PANEL WITH GFR
AG Ratio: 1.5 (calc) (ref 1.0–2.5)
ALT: 18 U/L (ref 6–29)
AST: 21 U/L (ref 10–35)
Albumin: 4.2 g/dL (ref 3.6–5.1)
Alkaline phosphatase (APISO): 97 U/L (ref 37–153)
BUN/Creatinine Ratio: 11 (calc) (ref 6–22)
BUN: 14 mg/dL (ref 7–25)
CO2: 26 mmol/L (ref 20–32)
Calcium: 10.3 mg/dL (ref 8.6–10.4)
Chloride: 105 mmol/L (ref 98–110)
Creat: 1.31 mg/dL — ABNORMAL HIGH (ref 0.60–0.93)
GFR, Est African American: 47 mL/min/{1.73_m2} — ABNORMAL LOW (ref >=60)
GFR, Est Non African American: 40 mL/min/{1.73_m2} — ABNORMAL LOW (ref >=60)
Globulin: 2.8 g/dL (calc) (ref 1.9–3.7)
Glucose, Bld: 84 mg/dL (ref 65–99)
Potassium: 4.1 mmol/L (ref 3.5–5.3)
Sodium: 140 mmol/L (ref 135–146)
Total Bilirubin: 0.3 mg/dL (ref 0.2–1.2)
Total Protein: 7 g/dL (ref 6.1–8.1)

## 2019-10-27 LAB — LIPASE: Lipase: 47 U/L (ref 7–60)

## 2019-10-27 LAB — LIPID PANEL
Cholesterol: 139 mg/dL (ref <200)
HDL: 61 mg/dL (ref >=50)
LDL Cholesterol (Calc): 60 mg/dL (calc)
Non-HDL Cholesterol (Calc): 78 mg/dL (calc) (ref <130)
Total CHOL/HDL Ratio: 2.3 (calc) (ref <5.0)
Triglycerides: 97 mg/dL (ref <150)

## 2019-10-30 NOTE — Telephone Encounter (Signed)
Laurie Bridges, CMA  

## 2019-10-31 ENCOUNTER — Other Ambulatory Visit: Payer: Self-pay

## 2019-10-31 ENCOUNTER — Other Ambulatory Visit (HOSPITAL_COMMUNITY)
Admission: RE | Admit: 2019-10-31 | Discharge: 2019-10-31 | Disposition: A | Discharge status: Home / Self Care | Payer: Medicare Other | Source: Ambulatory Visit | Attending: Internal Medicine | Admitting: Internal Medicine

## 2019-10-31 DIAGNOSIS — Z20822 Contact with and (suspected) exposure to covid-19: Secondary | ICD-10-CM | POA: Diagnosis not present

## 2019-10-31 DIAGNOSIS — Z01812 Encounter for preprocedural laboratory examination: Secondary | ICD-10-CM | POA: Diagnosis not present

## 2019-10-31 LAB — SARS CORONAVIRUS 2 (TAT 6-24 HRS): SARS Coronavirus 2: NEGATIVE

## 2019-11-02 ENCOUNTER — Ambulatory Visit (HOSPITAL_COMMUNITY): Admit: 2019-11-02 | Payer: Medicare Other | Admitting: Internal Medicine

## 2019-11-02 ENCOUNTER — Encounter (HOSPITAL_COMMUNITY): Payer: Self-pay | Admitting: Internal Medicine

## 2019-11-02 ENCOUNTER — Ambulatory Visit (HOSPITAL_COMMUNITY)
Admission: RE | Admit: 2019-11-02 | Discharge: 2019-11-02 | Disposition: A | Discharge status: Home / Self Care | Payer: Medicare Other | Attending: Internal Medicine | Admitting: Internal Medicine

## 2019-11-02 ENCOUNTER — Encounter (HOSPITAL_COMMUNITY): Admission: RE | Payer: Self-pay | Source: Home / Self Care

## 2019-11-02 ENCOUNTER — Ambulatory Visit (HOSPITAL_COMMUNITY): Admission: RE | Admit: 2019-11-02 | Payer: Medicare Other | Source: Home / Self Care | Admitting: Internal Medicine

## 2019-11-02 ENCOUNTER — Encounter (HOSPITAL_COMMUNITY): Payer: Self-pay

## 2019-11-02 ENCOUNTER — Encounter (HOSPITAL_COMMUNITY)
Admission: RE | Disposition: A | Discharge status: Home / Self Care | Payer: Self-pay | Source: Home / Self Care | Attending: Internal Medicine

## 2019-11-02 ENCOUNTER — Other Ambulatory Visit: Payer: Self-pay

## 2019-11-02 DIAGNOSIS — I2510 Atherosclerotic heart disease of native coronary artery without angina pectoris: Secondary | ICD-10-CM | POA: Insufficient documentation

## 2019-11-02 DIAGNOSIS — K219 Gastro-esophageal reflux disease without esophagitis: Secondary | ICD-10-CM | POA: Insufficient documentation

## 2019-11-02 DIAGNOSIS — I10 Essential (primary) hypertension: Secondary | ICD-10-CM | POA: Insufficient documentation

## 2019-11-02 DIAGNOSIS — R112 Nausea with vomiting, unspecified: Secondary | ICD-10-CM | POA: Diagnosis not present

## 2019-11-02 DIAGNOSIS — K2289 Other specified disease of esophagus: Secondary | ICD-10-CM | POA: Diagnosis not present

## 2019-11-02 DIAGNOSIS — Z7982 Long term (current) use of aspirin: Secondary | ICD-10-CM | POA: Insufficient documentation

## 2019-11-02 DIAGNOSIS — Z79899 Other long term (current) drug therapy: Secondary | ICD-10-CM | POA: Insufficient documentation

## 2019-11-02 DIAGNOSIS — K449 Diaphragmatic hernia without obstruction or gangrene: Secondary | ICD-10-CM | POA: Diagnosis not present

## 2019-11-02 DIAGNOSIS — Z7189 Other specified counseling: Secondary | ICD-10-CM

## 2019-11-02 HISTORY — PX: ESOPHAGOGASTRODUODENOSCOPY: SHX5428

## 2019-11-02 SURGERY — EGD (ESOPHAGOGASTRODUODENOSCOPY)
Anesthesia: Moderate Sedation

## 2019-11-02 SURGERY — COLONOSCOPY
Anesthesia: Moderate Sedation

## 2019-11-02 MED ORDER — SODIUM CHLORIDE 0.9 % IV SOLN: INTRAVENOUS | Status: DC

## 2019-11-02 MED ORDER — MIDAZOLAM HCL 5 MG/5ML IJ SOLN
INTRAMUSCULAR | Status: DC | PRN
  Administered 2019-11-02 (×3): 2 mg via INTRAVENOUS

## 2019-11-02 MED ORDER — FENTANYL CITRATE (PF) 100 MCG/2ML IJ SOLN
INTRAMUSCULAR | Status: DC | PRN
Start: 2019-11-02 — End: 2019-11-02
  Administered 2019-11-02 (×2): 25 ug via INTRAVENOUS

## 2019-11-02 MED ORDER — LIDOCAINE VISCOUS HCL 2 % MT SOLN
OROMUCOSAL | Status: DC | PRN
  Administered 2019-11-02: 5 mL via OROMUCOSAL

## 2019-11-02 MED ORDER — STERILE WATER FOR IRRIGATION IR SOLN
Status: DC | PRN
  Administered 2019-11-02: 1.5 mL

## 2019-11-02 MED ORDER — MEPERIDINE HCL 50 MG/ML IJ SOLN
INTRAMUSCULAR | Status: AC
  Filled 2019-11-02: qty 1

## 2019-11-02 MED ORDER — FENTANYL CITRATE (PF) 100 MCG/2ML IJ SOLN
INTRAMUSCULAR | Status: AC
  Filled 2019-11-02: qty 2

## 2019-11-02 MED ORDER — LIDOCAINE VISCOUS HCL 2 % MT SOLN
OROMUCOSAL | Status: AC
  Filled 2019-11-02: qty 15

## 2019-11-02 MED ORDER — MIDAZOLAM HCL 5 MG/5ML IJ SOLN
INTRAMUSCULAR | Status: AC
  Filled 2019-11-02: qty 10

## 2019-11-02 NOTE — Discharge Instructions (Signed)
Resume usual medications and diet as before. No driving for 24 hours. Consultation with Dr. Constance Haw to be arranged.      Message left for Lacretia Nicks office coordinator for referral      Upper Endoscopy, Adult, Care After This sheet gives you information about how to care for yourself after your procedure. Your health care provider may also give you more specific instructions. If you have problems or questions, contact your health care provider. What can I expect after the procedure? After the procedure, it is common to have:  A sore throat.  Mild stomach pain or discomfort.  Bloating.  Nausea. Follow these instructions at home:   Follow instructions from your health care provider about what to eat or drink after your procedure.  Return to your normal activities as told by your health care provider. Ask your health care provider what activities are safe for you.  Take over-the-counter and prescription medicines only as told by your health care provider.  Do not drive for 24 hours if you were given a sedative during your procedure.  Keep all follow-up visits as told by your health care provider. This is important. Contact a health care provider if you have:  A sore throat that lasts longer than one day.  Trouble swallowing. Get help right away if:  You vomit blood or your vomit looks like coffee grounds.  You have: ? A fever. ? Bloody, black, or tarry stools. ? A severe sore throat or you cannot swallow. ? Difficulty breathing. ? Severe pain in your chest or abdomen. Summary  After the procedure, it is common to have a sore throat, mild stomach discomfort, bloating, and nausea.  Do not drive for 24 hours if you were given a sedative during the procedure.  Follow instructions from your health care provider about what to eat or drink after your procedure.  Return to your normal activities as told by your health care provider. This information is not intended to  replace advice given to you by your health care provider. Make sure you discuss any questions you have with your health care provider. Document Revised: 06/22/2017 Document Reviewed: 05/31/2017 Elsevier Patient Education  [place].

## 2019-11-02 NOTE — Op Note (Signed)
[facility] Patient Name: Laurie Bridges Procedure Date: 11/02/2019 10:20 AM MRN: [id number] Date of Birth: May 21, 1945 Attending MD: Hildred Laser , MD CSN: [id number] Age: 74 Admit Type: Outpatient Procedure:                Upper GI endoscopy Indications:              Gastro-esophageal reflux disease, Nausea with                            vomiting Providers:                Hildred Laser, MD, Lambert Mody, Caprice Kluver,                            Raphael Gibney, Technician Referring MD:             Asencion Noble, MD Medicines:                Lidocaine jelly, Meperidine 50 mg IV, Midazolam 6                            mg IV Complications:            No immediate complications. Estimated Blood Loss:     Estimated blood loss: none. Procedure:                Pre-Anesthesia Assessment:                           - Prior to the procedure, a History and Physical                            was performed, and patient medications and                            allergies were reviewed. The patient's tolerance of                            previous anesthesia was also reviewed. The risks                            and benefits of the procedure and the sedation                            options and risks were discussed with the patient.                            All questions were answered, and informed consent                            was obtained. Prior Anticoagulants: The patient has                            taken no previous anticoagulant or antiplatelet                            agents except  for aspirin. ASA Grade Assessment: II                            - A patient with mild systemic disease. After                            reviewing the risks and benefits, the patient was                            deemed in satisfactory condition to undergo the                            procedure.                           After obtaining informed consent, the endoscope was                             passed under direct vision. Throughout the                            procedure, the patient's blood pressure, pulse, and                            oxygen saturations were monitored continuously. The                            [id number]-[id number] ([id number]) was introduced through the                            mouth, and advanced to the second part of duodenum.                            The upper GI endoscopy was accomplished without                            difficulty. The patient tolerated the procedure                            well. Scope In: 10:48:38 AM Scope Out: 10:53:18 AM Total Procedure Duration: 0 hours 4 minutes 40 seconds  Findings:      The hypopharynx was normal.      The examined esophagus was normal.      The Z-line was irregular and was found 35 cm from the incisors.      A 2 cm hiatal hernia was present.      The entire examined stomach was normal.      The duodenal bulb, second portion of the duodenum and major papilla were       normal. Impression:               - Normal hypopharynx.                           - Normal esophagus.                           -  Z-line irregular, 35 cm from the incisors.                           - 2 cm hiatal hernia.                           - Normal stomach.                           - Normal duodenal bulb, second portion of the                            duodenum and major papilla.                           - No specimens collected. Moderate Sedation:      Moderate (conscious) sedation was administered by the endoscopy nurse       and supervised by the endoscopist. The following parameters were       monitored: oxygen saturation, heart rate, blood pressure, CO2       capnography and response to care. Total physician intraservice time was       11 minutes. Recommendation:           - Patient has a contact number available for                            emergencies. The signs and symptoms of potential                             delayed complications were discussed with the                            patient. Return to normal activities tomorrow.                            Written discharge instructions were provided to the                            patient.                           - Resume previous diet today.                           - Continue present medications.                           - Referral to Dr. Constance Haw for cholecystectomy. Procedure Code(s):        --- Professional ---                           [phone], Esophagogastroduodenoscopy, flexible,                            transoral; diagnostic, including collection of  specimen(s) by brushing or washing, when performed                            (separate procedure)                           G0500, Moderate sedation services provided by the                            same physician or other qualified health care                            professional performing a gastrointestinal                            endoscopic service that sedation supports,                            requiring the presence of an independent trained                            observer to assist in the monitoring of the                            patient's level of consciousness and physiological                            status; initial 15 minutes of intra-service time;                            patient age 91 years or older (additional time may                            be reported with [phone], as appropriate) Diagnosis Code(s):        --- Professional ---                           K22.8, Other specified diseases of esophagus                           K44.9, Diaphragmatic hernia without obstruction or                            gangrene                           K21.9, Gastro-esophageal reflux disease without                            esophagitis                           R11.2, Nausea with vomiting, unspecified CPT copyright 2019 American  Medical Association. All rights reserved. The codes documented in this report are preliminary and upon coder review may  be revised to meet current compliance requirements. Hildred Laser, MD Bernadene Person  Laural Golden, MD 11/02/2019 11:03:21 AM This report has been signed electronically. Number of Addenda: 0

## 2019-11-02 NOTE — H&P (Signed)
Laurie Bridges is an 74 y.o. female.   Chief Complaint: Patient is here for esophagogastroduodenoscopy. HPI: Patient 74 year old Caucasian female who has chronic GERD maintained on PPI who rarely has heartburn who has history of sporadic nausea and vomiting.  She was found to have cholelithiasis.  She was seen by Dr. Constance Haw and he felt her symptoms were not combating for gallbladder disease.  She has had frequent spells of postprandial nausea and vomiting for the last 3 months.  Few weeks ago she had a period of several days where he would throw up every day.  Vomit occurs within 30 minutes of meals.  She is watching her diet.  She does not take OTC NSAIDs other than low-dose aspirin.  She has not experienced hematemesis melena or rectal bleeding.  She also has been experiencing mild pain under the right rib cage.  She has not had fever or chills.  She is maintaining her weight. She is also due for colonoscopy but we decided to postpone screening colonoscopy because of her symptoms.  Past Medical History:  Diagnosis Date  . Anemia    hx of Fe def many years ago  . Anxiety   . Arthritis    back  . CAD (coronary artery disease)    a. s/p Coronary CT in 06/2018 showing mild stenosis along the mid-LAD, mid-LCx and mid-RCA  . GERD (gastroesophageal reflux disease)   . Hypertension   . PONV (postoperative nausea and vomiting)         Cholelithiasis  Past Surgical History:  Procedure Laterality Date  . ABDOMINAL HYSTERECTOMY     partial hysterectomy, ovaries remain  . BACK SURGERY    . ESOPHAGEAL DILATION N/A 11/17/2017   Procedure: ESOPHAGEAL DILATION;  Surgeon: Rogene Houston, MD;  [facility]: [facility];  Service: Endoscopy;  Laterality: N/A;  . ESOPHAGOGASTRODUODENOSCOPY N/A 11/17/2017   Procedure: ESOPHAGOGASTRODUODENOSCOPY (EGD);  Surgeon: Rogene Houston, MD;  Location: [facility];  Service: Endoscopy;  Laterality: N/A;  12:00  . FOOT SURGERY      Family History  Problem  Relation Age of Onset  . Stroke Mother   . Stroke Father   . Cancer Maternal Uncle   . Cancer Paternal Uncle   . Cancer Paternal Uncle    Social History:  reports that she has never smoked. She has never used smokeless tobacco. She reports that she does not drink alcohol and does not use drugs.  Allergies:  Allergies  Allergen Reactions  . Codeine Other (See Comments)    Syncope   . Demerol Rash  . Feldene [Piroxicam] Rash  . Latex Rash  . Pyridium [Phenazopyridine Hcl] Itching and Rash  . Sulfur Rash    Medications Prior to Admission  Medication Sig Dispense Refill  . amLODipine (NORVASC) 5 MG tablet Take 1 tablet (5 mg total) by mouth daily. (Patient taking differently: Take 5 mg by mouth at bedtime. ) 30 tablet 0  . dexlansoprazole (DEXILANT) 60 MG capsule Take 1 capsule (60 mg total) by mouth daily. 30 capsule 11  . diphenhydrAMINE HCl, Sleep, (UNISOM SLEEPGELS) 50 MG CAPS Take 50 mg by mouth at bedtime.    . hydroquinone 4 % cream Apply 1 application topically in the morning and at bedtime. Applied to dark spots    . levocetirizine (XYZAL) 5 MG tablet Take 5 mg by mouth at bedtime.    . rosuvastatin (CRESTOR) 40 MG tablet Take 1 tablet (40 mg total) by mouth daily. (Patient taking differently:  Take 40 mg by mouth at bedtime. ) 90 tablet 3  . aspirin EC 81 MG tablet Take 1 tablet (81 mg total) by mouth daily. (Patient taking differently: Take 81 mg by mouth at bedtime. ) 90 tablet 3    No results found for this or any previous visit (from the past 48 hour(s)). No results found.  Review of Systems  Blood pressure 132/78, pulse 68, temperature 98.4 F (36.9 C), temperature source Oral, resp. rate 15, height 5' 6.5" (1.689 m), weight 71.2 kg, SpO2 99 %. Physical Exam HENT:     Mouth/Throat:     Mouth: Mucous membranes are moist.     Pharynx: Oropharynx is clear.  Eyes:     Conjunctiva/sclera: Conjunctivae normal.  Cardiovascular:     Rate and Rhythm: Normal rate and  regular rhythm.     Heart sounds: Normal heart sounds. No murmur heard.   Pulmonary:     Effort: Pulmonary effort is normal.     Breath sounds: Normal breath sounds.  Abdominal:     Comments: Abdomen is full.  On palpation is soft and nontender with organomegaly or masses.  Musculoskeletal:        General: No swelling.  Skin:    General: Skin is warm and dry.  Neurological:     Mental Status: She is alert.      Assessment/Plan  Recurrent nausea and vomiting. History of GERD with satisfactory control of symptoms on PPI. Diagnostic esophagogastroduodenoscopy.  Hildred Laser, MD 11/02/2019, 10:35 AM

## 2019-11-07 ENCOUNTER — Encounter (HOSPITAL_COMMUNITY): Payer: Self-pay | Admitting: Internal Medicine

## 2019-11-14 ENCOUNTER — Other Ambulatory Visit: Payer: Self-pay

## 2019-11-14 ENCOUNTER — Ambulatory Visit (INDEPENDENT_AMBULATORY_CARE_PROVIDER_SITE_OTHER): Payer: Medicare Other | Admitting: General Surgery

## 2019-11-14 ENCOUNTER — Encounter: Payer: Self-pay | Admitting: General Surgery

## 2019-11-14 VITALS — BP 128/80 | HR 84 | Temp 97.8°F | Resp 14 | Ht 66.5 in | Wt 158.0 lb

## 2019-11-14 DIAGNOSIS — K8020 Calculus of gallbladder without cholecystitis without obstruction: Secondary | ICD-10-CM

## 2019-11-14 NOTE — Progress Notes (Signed)
[facility] History and Physical   Chief Complaint    Follow-up      Laurie Bridges is a 74 y.o. female.  HPI: Laurie Bridges is a 74 yo who I saw last year with complaints of nausea and bloating and some vomiting. Laurie Bridges had no overt RUQ pain and had a [place] with stones but at the time was planning for an EGD and repeat imaging of her gallbladder. We discussed the removing the gallbladder after these test were done and Laurie Bridges is following back up now.  Laurie Bridges says in the last year Laurie Bridges had actually started to have some RUQ pain that wraps around to her back. Laurie Bridges says that Laurie Bridges cannot specifically say it is from food but has this constant tinge or tenderness in the area.  Laurie Bridges has had an EGD that was normal and her repeat US last year was stable with stones.  Laurie Bridges continues to have the nausea, bloating and vomiting and has diagnosis of GERD, and gastroparesis.  Laurie Bridges had a cardiac catheterization in 2020 that demonstrated mild stenosis and no stent was placed. Laurie Bridges denies any chest pain or recent SOB. Laurie Bridges has seen cardiology in August and they felt that Laurie Bridges was stable from a cardiac standpoint.   Past Medical History:  Diagnosis Date  . Anemia    hx of Fe def many years ago  . Anxiety   . Arthritis    back  . CAD (coronary artery disease)    a. s/p Coronary CT in 06/2018 showing mild stenosis along the mid-LAD, mid-LCx and mid-RCA  . GERD (gastroesophageal reflux disease)   . Hypertension   . PONV (postoperative nausea and vomiting)     Past Surgical History:  Procedure Laterality Date  . ABDOMINAL HYSTERECTOMY     partial hysterectomy, ovaries remain  . BACK SURGERY    . ESOPHAGEAL DILATION N/A 11/17/2017   Procedure: ESOPHAGEAL DILATION;  Surgeon: Rogene Houston, MD;  [facility]: [facility];  Service: Endoscopy;  Laterality: N/A;  . ESOPHAGOGASTRODUODENOSCOPY N/A 11/17/2017   Procedure: ESOPHAGOGASTRODUODENOSCOPY (EGD);  Surgeon: Rogene Houston, MD;  [facility];  Service: Endoscopy;  Laterality: N/A;  12:00  . ESOPHAGOGASTRODUODENOSCOPY N/A 11/02/2019   Procedure: ESOPHAGOGASTRODUODENOSCOPY (EGD);  Surgeon: Rogene Houston, MD;  Location: [facility];  Service: Endoscopy;  Laterality: N/A;  945  . FOOT SURGERY      Family History  Problem Relation Age of Onset  . Stroke Mother   . Stroke Father   . Cancer Maternal Uncle   . Cancer Paternal Uncle   . Cancer Paternal Uncle     Social History   Tobacco Use  . Smoking status: Never Smoker  . Smokeless tobacco: Never Used  Vaping Use  . Vaping Use: Never used  Substance Use Topics  . Alcohol use: No  . Drug use: No    Medications: I have reviewed the patient's current medications. Allergies as of 11/14/2019      Reactions   Codeine Other (See Comments)   Syncope    Demerol Rash   Feldene [piroxicam] Rash   Latex Rash   Pyridium [phenazopyridine Hcl] Itching, Rash   Sulfur Rash      Medication List       Accurate as of November 14, 2019 11:26 AM. If you have any questions, ask your nurse or doctor.        amLODipine 5 MG tablet Commonly known as: NORVASC Take 1 tablet (5 mg  total) by mouth daily. What changed: when to take this   aspirin EC 81 MG tablet Take 1 tablet (81 mg total) by mouth daily. What changed: when to take this   dexlansoprazole 60 MG capsule Commonly known as: DEXILANT Take 1 capsule (60 mg total) by mouth daily.   hydroquinone 4 % cream Apply 1 application topically in the morning and at bedtime. Applied to dark spots   levocetirizine 5 MG tablet Commonly known as: XYZAL Take 5 mg by mouth at bedtime.   rosuvastatin 40 MG tablet Commonly known as: CRESTOR Take 1 tablet (40 mg total) by mouth daily. What changed: when to take this   Unisom Sleepgels 50 MG Caps Generic drug: diphenhydrAMINE HCl (Sleep) Take 50 mg by mouth at bedtime.        ROS:  A comprehensive review of systems was negative except for: Gastrointestinal:  positive for abdominal pain, nausea, reflux symptoms and vomiting  Blood pressure 128/80, pulse 84, temperature 97.8 F (36.6 C), temperature source Oral, resp. rate 14, height 5' 6.5" (1.689 m), weight 158 lb (71.7 kg), SpO2 97 %. Physical Exam Vitals reviewed.  Constitutional:      Appearance: Normal appearance.  HENT:     Head: Normocephalic.     Nose: Nose normal.     Mouth/Throat:     Mouth: Mucous membranes are moist.  Eyes:     Extraocular Movements: Extraocular movements intact.  Cardiovascular:     Rate and Rhythm: Normal rate and regular rhythm.  Pulmonary:     Effort: Pulmonary effort is normal.     Breath sounds: Normal breath sounds.  Abdominal:     General: There is no distension.     Palpations: Abdomen is soft.     Tenderness: There is abdominal tenderness in the right upper quadrant.  Musculoskeletal:        General: No swelling. Normal range of motion.     Cervical back: Normal range of motion.  Skin:    General: Skin is warm.  Neurological:     General: No focal deficit present.     Mental Status: Laurie Bridges is alert and oriented to person, place, and time.  Psychiatric:        Mood and Affect: Mood normal.        Behavior: Behavior normal.        Thought Content: Thought content normal.        Judgment: Judgment normal.     Results: CLINICAL DATA:  Nausea and vomiting.  Gallstones.  EXAM: ABDOMEN ULTRASOUND COMPLETE  COMPARISON:  Ultrasound dated 12/02/2017 and CT scan of the abdomen dated 12/07/2017  FINDINGS: Gallbladder: There is a single small stone in the gallbladder. No significant thickening of the gallbladder wall. Negative sonographic Murphy's sign.  Common bile duct: Diameter: 3.8 mm, normal.  Liver: 1 cm slightly hypoechoic lesion in the anterior aspect of the left lobe of the liver unchanged since the prior exam. Within normal limits in parenchymal echogenicity. Portal vein is patent on color Doppler imaging with normal  direction of blood flow towards the liver.  IVC: No abnormality visualized.  Pancreas: Visualized portion unremarkable.  Spleen: Size and appearance within normal limits.  Right Kidney: Length: 11.6 cm. Echogenicity within normal limits. No solid mass or hydronephrosis visualized. 6.1 cm slightly complex cyst in the mid and lower pole of the right kidney, stable.  Left Kidney: Length: 10.7 cm. Echogenicity within normal limits. No mass or hydronephrosis visualized.  Abdominal aorta: No aneurysm  visualized.  Other findings: None.  IMPRESSION: 1. Single small gallstone, unchanged. 2. Stable 1 cm lesion in the right lobe of the liver, probably a cyst as demonstrated on the prior CT scan of 12/07/2017. 3. No acute abnormalities.   Electronically Signed   By: Lorriane Shire M.D.   On: 11/25/2018 09:28  Assessment & Plan:  Laurie Bridges is a 74 y.o. female with gallstones who has other issues with GERD and gastroparesis but is starting to have more symptoms that can be attributed to her gallstones. We discussed that not all of her symptoms may improve with gallbladder removal and that the gastroparesis and GERD could play into the main issues of nausea and vomiting. We discussed that Laurie Bridges may have some degree of chronic inflammation given her tenderness in the area.   -PLAN: I counseled the patient about the indication, risks and benefits of laparoscopic cholecystectomy.  Laurie Bridges understands there is a very small chance for bleeding, infection, injury to normal structures (including common bile duct), conversion to open surgery, persistent symptoms, evolution of postcholecystectomy diarrhea, need for secondary interventions, anesthesia reaction, cardiopulmonary issues and other risks not specifically detailed here. I described the expected recovery, the plan for follow-up and the restrictions during the recovery phase.  All questions were answered.   Discussed preoperative  COVID testing.   All questions were answered to the satisfaction of the patient.   Virl Cagey 11/14/2019, 11:26 AM

## 2019-11-14 NOTE — Patient Instructions (Signed)
Cholelithiasis  Cholelithiasis is also called "gallstones." It is a kind of gallbladder disease. The gallbladder is an organ that stores a liquid (bile) that helps you digest fat. Gallstones may not cause symptoms (may be silent gallstones) until they cause a blockage, and then they can cause pain (gallbladder attack). Follow these instructions at home:  Take over-the-counter and prescription medicines only as told by your doctor.  Stay at a healthy weight.  Eat healthy foods. This includes: ? Eating fewer fatty foods, like fried foods. ? Eating fewer refined carbs (refined carbohydrates). Refined carbs are breads and grains that are highly processed, like white bread and white rice. Instead, choose whole grains like whole-wheat bread and brown rice. ? Eating more fiber. Almonds, fresh fruit, and beans are healthy sources of fiber.  Keep all follow-up visits as told by your doctor. This is important. Contact a doctor if:  You have sudden pain in the upper right side of your belly (abdomen). Pain might spread to your right shoulder or your chest. This may be a sign of a gallbladder attack.  You feel sick to your stomach (are nauseous).  You throw up (vomit).  You have been diagnosed with gallstones that have no symptoms and you get: ? Belly pain. ? Discomfort, burning, or fullness in the upper part of your belly (indigestion). Get help right away if:  You have sudden pain in the upper right side of your belly, and it lasts for more than 2 hours.  You have belly pain that lasts for more than 5 hours.  You have a fever or chills.  You keep feeling sick to your stomach or you keep throwing up.  Your skin or the whites of your eyes turn yellow (jaundice).  You have dark-colored pee (urine).  You have light-colored poop (stool). Summary  Cholelithiasis is also called "gallstones."  The gallbladder is an organ that stores a liquid (bile) that helps you digest fat.  Silent  gallstones are gallstones that do not cause symptoms.  A gallbladder attack may cause sudden pain in the upper right side of your belly. Pain might spread to your right shoulder or your chest. If this happens, contact your doctor.  If you have sudden pain in the upper right side of your belly that lasts for more than 2 hours, get help right away. This information is not intended to replace advice given to you by your health care provider. Make sure you discuss any questions you have with your health care provider. Document Revised: 12/11/2016 Document Reviewed: 09/15/2015 Elsevier Patient Education  [place].   Laparoscopic Cholecystectomy Laparoscopic cholecystectomy is surgery to remove the gallbladder. The gallbladder is a pear-shaped organ that lies beneath the liver on the right side of the body. The gallbladder stores bile, which is a fluid that helps the body to digest fats. Cholecystectomy is often done for inflammation of the gallbladder (cholecystitis). This condition is usually caused by a buildup of gallstones (cholelithiasis) in the gallbladder. Gallstones can block the flow of bile, which can result in inflammation and pain. In severe cases, emergency surgery may be required. This procedure is done though small incisions in your abdomen (laparoscopic surgery). A thin scope with a camera (laparoscope) is inserted through one incision. Thin surgical instruments are inserted through the other incisions. In some cases, a laparoscopic procedure may be turned into a type of surgery that is done through a larger incision (open surgery). Tell a health care provider about:  Any allergies  you have.  All medicines you are taking, including vitamins, herbs, eye drops, creams, and over-the-counter medicines.  Any problems you or family members have had with anesthetic medicines.  Any blood disorders you have.  Any surgeries you have had.  Any medical conditions you  have.  Whether you are pregnant or may be pregnant. What are the risks? Generally, this is a safe procedure. However, problems may occur, including:  Infection.  Bleeding.  Allergic reactions to medicines.  Damage to other structures or organs.  A stone remaining in the common bile duct. The common bile duct carries bile from the gallbladder into the small intestine.  A bile leak from the cyst duct that is clipped when your gallbladder is removed. Medicines  Ask your health care provider about: ? Changing or stopping your regular medicines. This is especially important if you are taking diabetes medicines or blood thinners. ? Taking medicines such as aspirin and ibuprofen. These medicines can thin your blood. Do not take these medicines before your procedure if your health care provider instructs you not to.  You may be given antibiotic medicine to help prevent infection. General instructions  Let your health care provider know if you develop a cold or an infection before surgery.  Plan to have someone take you home from the hospital or clinic.  Ask your health care provider how your surgical site will be marked or identified. What happens during the procedure?   To reduce your risk of infection: ? Your health care team will wash or sanitize their hands. ? Your skin will be washed with soap. ? Hair may be removed from the surgical area.  An IV tube may be inserted into one of your veins.  You will be given one or more of the following: ? A medicine to help you relax (sedative). ? A medicine to make you fall asleep (general anesthetic).  A breathing tube will be placed in your mouth.  Your surgeon will make several small cuts (incisions) in your abdomen.  The laparoscope will be inserted through one of the small incisions. The camera on the laparoscope will send images to a TV screen (monitor) in the operating room. This lets your surgeon see inside your  abdomen.  Air-like gas will be pumped into your abdomen. This will expand your abdomen to give the surgeon more room to perform the surgery.  Other tools that are needed for the procedure will be inserted through the other incisions. The gallbladder will be removed through one of the incisions.  Your common bile duct may be examined. If stones are found in the common bile duct, they may be removed.  After your gallbladder has been removed, the incisions will be closed with stitches (sutures), staples, or skin glue.  Your incisions may be covered with a bandage (dressing). The procedure may vary among health care providers and hospitals. What happens after the procedure?  Your blood pressure, heart rate, breathing rate, and blood oxygen level will be monitored until the medicines you were given have worn off.  You will be given medicines as needed to control your pain.  Do not drive for 24 hours if you were given a sedative. This information is not intended to replace advice given to you by your health care provider. Make sure you discuss any questions you have with your health care provider. Document Revised: 12/11/2016 Document Reviewed: 06/17/2015 Elsevier Patient Education  [place].

## 2019-11-15 NOTE — H&P (Signed)
[facility] History and Physical   Chief Complaint    Follow-up      Laurie Bridges is a 74 y.o. female.  HPI: Laurie Bridges is a 74 yo who I saw last year with complaints of nausea and bloating and some vomiting. She had no overt RUQ pain and had a [place] with stones but at the time was planning for an EGD and repeat imaging of her gallbladder. We discussed the removing the gallbladder after these test were done and she is following back up now.  She says in the last year she had actually started to have some RUQ pain that wraps around to her back. She says that she cannot specifically say it is from food but has this constant tinge or tenderness in the area.  She has had an EGD that was normal and her repeat US last year was stable with stones.  She continues to have the nausea, bloating and vomiting and has diagnosis of GERD, and gastroparesis.  She had a cardiac catheterization in 2020 that demonstrated mild stenosis and no stent was placed. She denies any chest pain or recent SOB. She has seen cardiology in August and they felt that she was stable from a cardiac standpoint.   Past Medical History:  Diagnosis Date  . Anemia    hx of Fe def many years ago  . Anxiety   . Arthritis    back  . CAD (coronary artery disease)    a. s/p Coronary CT in 06/2018 showing mild stenosis along the mid-LAD, mid-LCx and mid-RCA  . GERD (gastroesophageal reflux disease)   . Hypertension   . PONV (postoperative nausea and vomiting)     Past Surgical History:  Procedure Laterality Date  . ABDOMINAL HYSTERECTOMY     partial hysterectomy, ovaries remain  . BACK SURGERY    . ESOPHAGEAL DILATION N/A 11/17/2017   Procedure: ESOPHAGEAL DILATION;  Surgeon: Rogene Houston, MD;  Location: [facility];  Service: Endoscopy;  Laterality: N/A;  . ESOPHAGOGASTRODUODENOSCOPY N/A 11/17/2017   Procedure: ESOPHAGOGASTRODUODENOSCOPY (EGD);  Surgeon: Rogene Houston, MD;  Location: [facility];  Service: Endoscopy;  Laterality: N/A;  12:00  . ESOPHAGOGASTRODUODENOSCOPY N/A 11/02/2019   Procedure: ESOPHAGOGASTRODUODENOSCOPY (EGD);  Surgeon: Rogene Houston, MD;  Location: [facility];  Service: Endoscopy;  Laterality: N/A;  945  . FOOT SURGERY      Family History  Problem Relation Age of Onset  . Stroke Mother   . Stroke Father   . Cancer Maternal Uncle   . Cancer Paternal Uncle   . Cancer Paternal Uncle     Social History   Tobacco Use  . Smoking status: Never Smoker  . Smokeless tobacco: Never Used  Vaping Use  . Vaping Use: Never used  Substance Use Topics  . Alcohol use: No  . Drug use: No    Medications: I have reviewed the patient's current medications. Allergies as of 11/14/2019      Reactions   Codeine Other (See Comments)   Syncope    Demerol Rash   Feldene [piroxicam] Rash   Latex Rash   Pyridium [phenazopyridine Hcl] Itching, Rash   Sulfur Rash      Medication List       Accurate as of November 14, 2019 11:26 AM. If you have any questions, ask your nurse or doctor.        amLODipine 5 MG tablet Commonly known as: NORVASC Take 1 tablet (5 mg  total) by mouth daily. What changed: when to take this   aspirin EC 81 MG tablet Take 1 tablet (81 mg total) by mouth daily. What changed: when to take this   dexlansoprazole 60 MG capsule Commonly known as: DEXILANT Take 1 capsule (60 mg total) by mouth daily.   hydroquinone 4 % cream Apply 1 application topically in the morning and at bedtime. Applied to dark spots   levocetirizine 5 MG tablet Commonly known as: XYZAL Take 5 mg by mouth at bedtime.   rosuvastatin 40 MG tablet Commonly known as: CRESTOR Take 1 tablet (40 mg total) by mouth daily. What changed: when to take this   Unisom Sleepgels 50 MG Caps Generic drug: diphenhydrAMINE HCl (Sleep) Take 50 mg by mouth at bedtime.        ROS:  A comprehensive review of systems was negative except for: Gastrointestinal:  positive for abdominal pain, nausea, reflux symptoms and vomiting  Blood pressure 128/80, pulse 84, temperature 97.8 F (36.6 C), temperature source Oral, resp. rate 14, height 5' 6.5" (1.689 m), weight 158 lb (71.7 kg), SpO2 97 %. Physical Exam Vitals reviewed.  Constitutional:      Appearance: Normal appearance.  HENT:     Head: Normocephalic.     Nose: Nose normal.     Mouth/Throat:     Mouth: Mucous membranes are moist.  Eyes:     Extraocular Movements: Extraocular movements intact.  Cardiovascular:     Rate and Rhythm: Normal rate and regular rhythm.  Pulmonary:     Effort: Pulmonary effort is normal.     Breath sounds: Normal breath sounds.  Abdominal:     General: There is no distension.     Palpations: Abdomen is soft.     Tenderness: There is abdominal tenderness in the right upper quadrant.  Musculoskeletal:        General: No swelling. Normal range of motion.     Cervical back: Normal range of motion.  Skin:    General: Skin is warm.  Neurological:     General: No focal deficit present.     Mental Status: She is alert and oriented to person, place, and time.  Psychiatric:        Mood and Affect: Mood normal.        Behavior: Behavior normal.        Thought Content: Thought content normal.        Judgment: Judgment normal.     Results: CLINICAL DATA:  Nausea and vomiting.  Gallstones.  EXAM: ABDOMEN ULTRASOUND COMPLETE  COMPARISON:  Ultrasound dated 12/02/2017 and CT scan of the abdomen dated 12/07/2017  FINDINGS: Gallbladder: There is a single small stone in the gallbladder. No significant thickening of the gallbladder wall. Negative sonographic Murphy's sign.  Common bile duct: Diameter: 3.8 mm, normal.  Liver: 1 cm slightly hypoechoic lesion in the anterior aspect of the left lobe of the liver unchanged since the prior exam. Within normal limits in parenchymal echogenicity. Portal vein is patent on color Doppler imaging with normal  direction of blood flow towards the liver.  IVC: No abnormality visualized.  Pancreas: Visualized portion unremarkable.  Spleen: Size and appearance within normal limits.  Right Kidney: Length: 11.6 cm. Echogenicity within normal limits. No solid mass or hydronephrosis visualized. 6.1 cm slightly complex cyst in the mid and lower pole of the right kidney, stable.  Left Kidney: Length: 10.7 cm. Echogenicity within normal limits. No mass or hydronephrosis visualized.  Abdominal aorta: No aneurysm  visualized.  Other findings: None.  IMPRESSION: 1. Single small gallstone, unchanged. 2. Stable 1 cm lesion in the right lobe of the liver, probably a cyst as demonstrated on the prior CT scan of 12/07/2017. 3. No acute abnormalities.   Electronically Signed   By: Lorriane Shire M.D.   On: 11/25/2018 09:28  Assessment & Plan:  Laurie Bridges is a 74 y.o. female with gallstones who has other issues with GERD and gastroparesis but is starting to have more symptoms that can be attributed to her gallstones. We discussed that not all of her symptoms may improve with gallbladder removal and that the gastroparesis and GERD could play into the main issues of nausea and vomiting. We discussed that she may have some degree of chronic inflammation given her tenderness in the area.   -PLAN: I counseled the patient about the indication, risks and benefits of laparoscopic cholecystectomy.  She understands there is a very small chance for bleeding, infection, injury to normal structures (including common bile duct), conversion to open surgery, persistent symptoms, evolution of postcholecystectomy diarrhea, need for secondary interventions, anesthesia reaction, cardiopulmonary issues and other risks not specifically detailed here. I described the expected recovery, the plan for follow-up and the restrictions during the recovery phase.  All questions were answered.   Discussed preoperative  COVID testing.   All questions were answered to the satisfaction of the patient.   Virl Cagey 11/14/2019, 11:26 AM

## 2019-11-18 DIAGNOSIS — Z23 Encounter for immunization: Secondary | ICD-10-CM | POA: Diagnosis not present

## 2019-11-23 ENCOUNTER — Encounter (HOSPITAL_COMMUNITY)
Admission: RE | Admit: 2019-11-23 | Discharge: 2019-11-23 | Disposition: A | Discharge status: Home / Self Care | Payer: Medicare Other | Source: Ambulatory Visit | Attending: General Surgery | Admitting: General Surgery

## 2019-11-23 ENCOUNTER — Encounter (HOSPITAL_COMMUNITY): Payer: Self-pay

## 2019-11-24 ENCOUNTER — Other Ambulatory Visit: Payer: Self-pay

## 2019-11-24 ENCOUNTER — Other Ambulatory Visit (HOSPITAL_COMMUNITY)
Admission: RE | Admit: 2019-11-24 | Discharge: 2019-11-24 | Disposition: A | Discharge status: Home / Self Care | Payer: Medicare Other | Source: Ambulatory Visit | Attending: General Surgery | Admitting: General Surgery

## 2019-11-24 DIAGNOSIS — Z20822 Contact with and (suspected) exposure to covid-19: Secondary | ICD-10-CM | POA: Insufficient documentation

## 2019-11-24 DIAGNOSIS — Z01812 Encounter for preprocedural laboratory examination: Secondary | ICD-10-CM | POA: Insufficient documentation

## 2019-11-24 LAB — SARS CORONAVIRUS 2 (TAT 6-24 HRS): SARS Coronavirus 2: NEGATIVE

## 2019-11-27 ENCOUNTER — Ambulatory Visit (HOSPITAL_COMMUNITY): Payer: Medicare Other | Admitting: Anesthesiology

## 2019-11-27 ENCOUNTER — Ambulatory Visit (HOSPITAL_COMMUNITY)
Admission: RE | Admit: 2019-11-27 | Discharge: 2019-11-27 | Disposition: A | Discharge status: Home / Self Care | Payer: Medicare Other | Attending: General Surgery | Admitting: General Surgery

## 2019-11-27 ENCOUNTER — Encounter (HOSPITAL_COMMUNITY): Payer: Self-pay | Admitting: General Surgery

## 2019-11-27 ENCOUNTER — Encounter (HOSPITAL_COMMUNITY)
Admission: RE | Disposition: A | Discharge status: Home / Self Care | Payer: Self-pay | Source: Home / Self Care | Attending: General Surgery

## 2019-11-27 ENCOUNTER — Other Ambulatory Visit: Payer: Self-pay

## 2019-11-27 DIAGNOSIS — Z882 Allergy status to sulfonamides status: Secondary | ICD-10-CM | POA: Diagnosis not present

## 2019-11-27 DIAGNOSIS — Z91040 Latex allergy status: Secondary | ICD-10-CM | POA: Insufficient documentation

## 2019-11-27 DIAGNOSIS — K219 Gastro-esophageal reflux disease without esophagitis: Secondary | ICD-10-CM | POA: Insufficient documentation

## 2019-11-27 DIAGNOSIS — K8010 Calculus of gallbladder with chronic cholecystitis without obstruction: Secondary | ICD-10-CM | POA: Insufficient documentation

## 2019-11-27 DIAGNOSIS — Z885 Allergy status to narcotic agent status: Secondary | ICD-10-CM | POA: Diagnosis not present

## 2019-11-27 DIAGNOSIS — K8020 Calculus of gallbladder without cholecystitis without obstruction: Secondary | ICD-10-CM

## 2019-11-27 DIAGNOSIS — Z888 Allergy status to other drugs, medicaments and biological substances status: Secondary | ICD-10-CM | POA: Insufficient documentation

## 2019-11-27 HISTORY — PX: CHOLECYSTECTOMY: SHX55

## 2019-11-27 SURGERY — LAPAROSCOPIC CHOLECYSTECTOMY
Anesthesia: General | Site: Abdomen

## 2019-11-27 MED ORDER — SUGAMMADEX SODIUM 500 MG/5ML IV SOLN
INTRAVENOUS | Status: DC | PRN
  Administered 2019-11-27: 200 mg via INTRAVENOUS

## 2019-11-27 MED ORDER — HYDROMORPHONE HCL 1 MG/ML IJ SOLN: 0.2500 mg | INTRAMUSCULAR | Status: DC | PRN

## 2019-11-27 MED ORDER — DEXAMETHASONE SODIUM PHOSPHATE 10 MG/ML IJ SOLN
INTRAMUSCULAR | Status: AC
  Filled 2019-11-27: qty 1

## 2019-11-27 MED ORDER — CHLORHEXIDINE GLUCONATE CLOTH 2 % EX PADS: 6.0000 | MEDICATED_PAD | Freq: Once | CUTANEOUS | Status: DC

## 2019-11-27 MED ORDER — ROCURONIUM BROMIDE 10 MG/ML (PF) SYRINGE
PREFILLED_SYRINGE | INTRAVENOUS | Status: DC | PRN
  Administered 2019-11-27: 50 mg via INTRAVENOUS

## 2019-11-27 MED ORDER — PHENYLEPHRINE 40 MCG/ML (10ML) SYRINGE FOR IV PUSH (FOR BLOOD PRESSURE SUPPORT)
PREFILLED_SYRINGE | INTRAVENOUS | Status: AC
  Filled 2019-11-27: qty 10

## 2019-11-27 MED ORDER — BUPIVACAINE HCL (PF) 0.5 % IJ SOLN
INTRAMUSCULAR | Status: AC
  Filled 2019-11-27: qty 30

## 2019-11-27 MED ORDER — PROPOFOL 10 MG/ML IV BOLUS
INTRAVENOUS | Status: AC
  Filled 2019-11-27: qty 20

## 2019-11-27 MED ORDER — HYDROCODONE-ACETAMINOPHEN 5-325 MG PO TABS: 1.0000 | ORAL_TABLET | ORAL | 0 refills | Status: DC | PRN

## 2019-11-27 MED ORDER — SCOPOLAMINE 1 MG/3DAYS TD PT72
MEDICATED_PATCH | TRANSDERMAL | Status: AC
  Filled 2019-11-27: qty 1

## 2019-11-27 MED ORDER — SCOPOLAMINE 1 MG/3DAYS TD PT72
1.0000 | MEDICATED_PATCH | Freq: Once | TRANSDERMAL | Status: DC
  Administered 2019-11-27: 1.5 mg via TRANSDERMAL

## 2019-11-27 MED ORDER — CHLORHEXIDINE GLUCONATE 0.12 % MT SOLN
15.0000 mL | Freq: Once | OROMUCOSAL | Status: AC
  Administered 2019-11-27: 15 mL via OROMUCOSAL

## 2019-11-27 MED ORDER — SODIUM CHLORIDE 0.9 % IV SOLN
INTRAVENOUS | Status: AC
  Filled 2019-11-27: qty 2

## 2019-11-27 MED ORDER — ONDANSETRON HCL 4 MG/2ML IJ SOLN
INTRAMUSCULAR | Status: AC
  Filled 2019-11-27: qty 2

## 2019-11-27 MED ORDER — EPHEDRINE SULFATE 50 MG/ML IJ SOLN
INTRAMUSCULAR | Status: DC | PRN
  Administered 2019-11-27 (×4): 10 mg via INTRAVENOUS

## 2019-11-27 MED ORDER — LACTATED RINGERS IV SOLN: Freq: Once | INTRAVENOUS | Status: AC

## 2019-11-27 MED ORDER — LACTATED RINGERS IV SOLN: INTRAVENOUS | Status: DC | PRN

## 2019-11-27 MED ORDER — LIDOCAINE HCL (CARDIAC) PF 100 MG/5ML IV SOSY
PREFILLED_SYRINGE | INTRAVENOUS | Status: DC | PRN
  Administered 2019-11-27: 60 mg via INTRATRACHEAL

## 2019-11-27 MED ORDER — HEMOSTATIC AGENTS (NO CHARGE) OPTIME
TOPICAL | Status: DC | PRN
  Administered 2019-11-27: 1 via TOPICAL

## 2019-11-27 MED ORDER — DEXAMETHASONE SODIUM PHOSPHATE 10 MG/ML IJ SOLN
INTRAMUSCULAR | Status: DC | PRN
  Administered 2019-11-27: 5 mg via INTRAVENOUS

## 2019-11-27 MED ORDER — ORAL CARE MOUTH RINSE: 15.0000 mL | Freq: Once | OROMUCOSAL | Status: AC

## 2019-11-27 MED ORDER — LIDOCAINE 2% (20 MG/ML) 5 ML SYRINGE
INTRAMUSCULAR | Status: AC
  Filled 2019-11-27: qty 5

## 2019-11-27 MED ORDER — SODIUM CHLORIDE 0.9 % IR SOLN
Status: DC | PRN
  Administered 2019-11-27: 1000 mL

## 2019-11-27 MED ORDER — FENTANYL CITRATE (PF) 100 MCG/2ML IJ SOLN
INTRAMUSCULAR | Status: DC | PRN
  Administered 2019-11-27: 50 ug via INTRAVENOUS
  Administered 2019-11-27: 100 ug via INTRAVENOUS

## 2019-11-27 MED ORDER — FENTANYL CITRATE (PF) 250 MCG/5ML IJ SOLN
INTRAMUSCULAR | Status: AC
  Filled 2019-11-27: qty 5

## 2019-11-27 MED ORDER — SODIUM CHLORIDE 0.9 % IV SOLN
2.0000 g | INTRAVENOUS | Status: AC
  Administered 2019-11-27: 2 g via INTRAVENOUS

## 2019-11-27 MED ORDER — ONDANSETRON HCL 4 MG PO TABS: 4.0000 mg | ORAL_TABLET | Freq: Three times a day (TID) | ORAL | 1 refills | Status: DC | PRN

## 2019-11-27 MED ORDER — BUPIVACAINE HCL (PF) 0.5 % IJ SOLN
INTRAMUSCULAR | Status: DC | PRN
  Administered 2019-11-27: 10 mL

## 2019-11-27 MED ORDER — PROPOFOL 10 MG/ML IV BOLUS
INTRAVENOUS | Status: DC | PRN
  Administered 2019-11-27: 150 mg via INTRAVENOUS

## 2019-11-27 MED ORDER — ROCURONIUM BROMIDE 10 MG/ML (PF) SYRINGE
PREFILLED_SYRINGE | INTRAVENOUS | Status: AC
  Filled 2019-11-27: qty 10

## 2019-11-27 MED ORDER — ONDANSETRON HCL 4 MG/2ML IJ SOLN: 4.0000 mg | Freq: Once | INTRAMUSCULAR | Status: DC | PRN

## 2019-11-27 MED ORDER — ONDANSETRON HCL 4 MG/2ML IJ SOLN
INTRAMUSCULAR | Status: DC | PRN
  Administered 2019-11-27: 4 mg via INTRAVENOUS

## 2019-11-27 MED ORDER — SODIUM CHLORIDE 0.9 % IV SOLN
INTRAVENOUS | Status: DC | PRN
  Administered 2019-11-27: 80 ug via INTRAVENOUS

## 2019-11-27 SURGICAL SUPPLY — 43 items
ADH SKN CLS APL DERMABOND .7 (GAUZE/BANDAGES/DRESSINGS) ×1
APPLIER CLIP ROT 10 11.4 M/L (STAPLE) ×2
APR CLP MED LRG 11.4X10 (STAPLE) ×1
BAG RETRIEVAL 10 (BASKET) ×1
BLADE SURG 15 STRL LF DISP TIS (BLADE) ×1 IMPLANT
BLADE SURG 15 STRL SS (BLADE) ×2
CLIP APPLIE ROT 10 11.4 M/L (STAPLE) ×1 IMPLANT
CLOTH BEACON ORANGE TIMEOUT ST (SAFETY) ×2 IMPLANT
COVER LIGHT HANDLE STERIS (MISCELLANEOUS) ×4 IMPLANT
COVER WAND RF STERILE (DRAPES) ×2 IMPLANT
DECANTER SPIKE VIAL GLASS SM (MISCELLANEOUS) ×2 IMPLANT
DERMABOND ADVANCED (GAUZE/BANDAGES/DRESSINGS) ×1
DERMABOND ADVANCED .7 DNX12 (GAUZE/BANDAGES/DRESSINGS) ×1 IMPLANT
DURAPREP 26ML APPLICATOR (WOUND CARE) ×2 IMPLANT
ELECT REM PT RETURN 9FT ADLT (ELECTROSURGICAL) ×2
ELECTRODE REM PT RTRN 9FT ADLT (ELECTROSURGICAL) ×1 IMPLANT
GLOVE BIOGEL PI IND STRL 6.5 (GLOVE) ×1 IMPLANT
GLOVE BIOGEL PI IND STRL 7.0 (GLOVE) ×3 IMPLANT
GLOVE BIOGEL PI INDICATOR 6.5 (GLOVE) ×1
GLOVE BIOGEL PI INDICATOR 7.0 (GLOVE) ×3
GLOVE SURG SS PI 6.5 STRL IVOR (GLOVE) ×4 IMPLANT
GLOVE SURG SS PI 7.5 STRL IVOR (GLOVE) ×2 IMPLANT
GOWN STRL REUS W/TWL LRG LVL3 (GOWN DISPOSABLE) ×6 IMPLANT
HEMOSTAT SNOW SURGICEL 2X4 (HEMOSTASIS) ×2 IMPLANT
INST SET LAPROSCOPIC AP (KITS) ×2 IMPLANT
KIT TURNOVER KIT A (KITS) ×2 IMPLANT
MANIFOLD NEPTUNE II (INSTRUMENTS) ×2 IMPLANT
NEEDLE INSUFFLATION 14GA 120MM (NEEDLE) ×2 IMPLANT
NS IRRIG 1000ML POUR BTL (IV SOLUTION) ×2 IMPLANT
PACK LAP CHOLE LZT030E (CUSTOM PROCEDURE TRAY) ×2 IMPLANT
PAD ARMBOARD 7.5X6 YLW CONV (MISCELLANEOUS) ×2 IMPLANT
SET BASIN LINEN APH (SET/KITS/TRAYS/PACK) ×2 IMPLANT
SET TUBE SMOKE EVAC HIGH FLOW (TUBING) ×2 IMPLANT
SLEEVE ENDOPATH XCEL 5M (ENDOMECHANICALS) ×2 IMPLANT
SUT MNCRL AB 4-0 PS2 18 (SUTURE) ×4 IMPLANT
SUT VICRYL 0 UR6 27IN ABS (SUTURE) ×2 IMPLANT
SYS BAG RETRIEVAL 10MM (BASKET) ×1
SYSTEM BAG RETRIEVAL 10MM (BASKET) ×1 IMPLANT
TROCAR ENDO BLADELESS 11MM (ENDOMECHANICALS) ×2 IMPLANT
TROCAR XCEL NON-BLD 5MMX100MML (ENDOMECHANICALS) ×2 IMPLANT
TROCAR XCEL UNIV SLVE 11M 100M (ENDOMECHANICALS) ×2 IMPLANT
TUBE CONNECTING 12X1/4 (SUCTIONS) ×2 IMPLANT
WARMER LAPAROSCOPE (MISCELLANEOUS) ×2 IMPLANT

## 2019-11-27 NOTE — Op Note (Addendum)
Operative Note   Preoperative Diagnosis: Symptomatic cholelithiasis   Postoperative Diagnosis: Same   Procedure(s) Performed: Laparoscopic cholecystectomy   Surgeon: Ria Comment C. Constance Haw, MD   Assistants: Aviva Signs, MD    Anesthesia: General endotracheal   Anesthesiologist: Denese Killings, MD    Specimens: Gallbladder    Estimated Blood Loss: Minimal    Blood Replacement: None    Complications: None    Operative Findings:  Distended gallbladder   Procedure: The patient was taken to the operating room and placed supine. General endotracheal anesthesia was induced. Intravenous antibiotics were administered per protocol. An orogastric tube positioned to decompress the stomach. The abdomen was prepared and draped in the usual sterile fashion.    A supraumbilical incision was made and a Veress technique was utilized to achieve pneumoperitoneum to 15 mmHg with carbon dioxide. A 11 mm optiview port was placed through the supraumbilical region, and a 10 mm 0-degree operative laparoscope was introduced. The area underlying the trocar and Veress needle were inspected and without evidence of injury.  Remaining trocars were placed under direct vision. Two 5 mm ports were placed in the right abdomen, between the anterior axillary and midclavicular line.  A final 11 mm port was placed through the mid-epigastrium, near the falciform ligament.    There was some adhesion between the colon and gallbladder that were carefully taken down. The gallbladder fundus was elevated cephalad and the infundibulum was retracted to the patient's right. The gallbladder/cystic duct junction was skeletonized. The cystic artery noted in the triangle of Calot and was also skeletonized.  We then continued liberal medial and lateral dissection until the critical view of safety was achieved.    The cystic duct and cystic artery were doubly clipped and divided. The gallbladder was then dissected from the liver bed with  electrocautery. The specimen was placed in an Endopouch and was retrieved through the epigastric site.   Final inspection revealed acceptable hemostasis. Surgical SNOW was placed in the gallbladder bed.  Trocars were removed and pneumoperitoneum was released.  The epigastric and umbilical port sites were smaller than my fingertip. Skin incisions were closed with 4-0 Monocryl subcuticular sutures and Dermabond. The patient was awakened from anesthesia and extubated without complication.    Curlene Labrum, MD [facility] [address] [place], [place] [address] [phone] (office)

## 2019-11-27 NOTE — Interval H&P Note (Signed)
History and Physical Interval Note:  11/27/2019 7:21 AM  Laurie Bridges  has presented today for surgery, with the diagnosis of Cholelithiasis.  The various methods of treatment have been discussed with the patient and family. After consideration of risks, benefits and other options for treatment, the patient has consented to  Procedure(s): LAPAROSCOPIC CHOLECYSTECTOMY (N/A) as a surgical intervention.  The patient's history has been reviewed, patient examined, no change in status, stable for surgery.  I have reviewed the patient's chart and labs.  Questions were answered to the patient's satisfaction.    No changes.  Virl Cagey

## 2019-11-27 NOTE — Transfer of Care (Signed)
Immediate Anesthesia Transfer of Care Note  Patient: Laurie Bridges  Procedure(s) Performed: LAPAROSCOPIC CHOLECYSTECTOMY (N/A Abdomen)  Patient Location: PACU  Anesthesia Type:General  Level of Consciousness: awake, alert  and oriented  Airway & Oxygen Therapy: Patient Spontanous Breathing  Post-op Assessment: Report given to RN and Post -op Vital signs reviewed and stable  Post vital signs: Reviewed and stable  Last Vitals:  Vitals Value Taken Time  BP 153/71 11/27/19 0824  Temp    Pulse 86 11/27/19 0825  Resp 13 11/27/19 0825  SpO2 97 % 11/27/19 0825  Vitals shown include unvalidated device data.  Last Pain:  Vitals:   11/27/19 0633  TempSrc: Oral  PainSc: 0-No pain      Patients Stated Pain Goal: 7 (55/21/74 7159)  Complications: No complications documented.

## 2019-11-27 NOTE — Anesthesia Preprocedure Evaluation (Signed)
Anesthesia Evaluation  Patient identified by MRN, date of birth, ID band Patient awake    Reviewed: Allergy & Precautions, NPO status , Patient's Chart, lab work & pertinent test results  History of Anesthesia Complications (+) PONV and history of anesthetic complications  Airway Mallampati: II  TM Distance: >3 FB Neck ROM: Full    Dental  (+) Dental Advisory Given, Teeth Intact Crowns :   Pulmonary shortness of breath and with exertion,    Pulmonary exam normal breath sounds clear to auscultation       Cardiovascular Exercise Tolerance: Good hypertension, Pt. on medications + CAD  Normal cardiovascular exam Rhythm:Regular Rate:Normal  1. The left ventricle has normal systolic function, with an ejection  fraction of 55-60%. The cavity size was normal. Left ventricular diastolic  Doppler parameters are consistent with impaired relaxation.  2. The right ventricle has normal systolic function. The cavity was  normal. There is no increase in right ventricular wall thickness.  3. No evidence of mitral valve stenosis.  4. The aortic valve is tricuspid. No stenosis of the aortic valve.  5. The aortic root is normal in size and structure   Neuro/Psych Anxiety    GI/Hepatic GERD  Medicated and Controlled,Cholecystitis     Endo/Other  negative endocrine ROS  Renal/GU negative Renal ROS     Musculoskeletal  (+) Arthritis ,   Abdominal   Peds  Hematology  (+) anemia ,   Anesthesia Other Findings   Reproductive/Obstetrics                             Anesthesia Physical Anesthesia Plan  ASA: II  Anesthesia Plan: General   Post-op Pain Management:    Induction: Intravenous  PONV Risk Score and Plan: 4 or greater and Ondansetron, Dexamethasone, Midazolam and Scopolamine patch - Pre-op  Airway Management Planned: Oral ETT  Additional Equipment:   Intra-op Plan:   Post-operative  Plan: Extubation in OR  Informed Consent: I have reviewed the patients History and Physical, chart, labs and discussed the procedure including the risks, benefits and alternatives for the proposed anesthesia with the patient or authorized representative who has indicated his/her understanding and acceptance.     Dental advisory given  Plan Discussed with: CRNA and Surgeon  Anesthesia Plan Comments:         Anesthesia Quick Evaluation

## 2019-11-27 NOTE — Discharge Instructions (Signed)
[place] November THE SCOPOLAMINE [facility] UNTIL Thursday November 18TH, 2021. THIS HELPS CONTROL POSTOPERATIVE NAUSEA.  [facility] HANDS AFTER REMOVAL        Discharge Laparoscopic Surgery Instructions:  Common Complaints: Right shoulder pain is common after laparoscopic surgery. This is secondary to the gas used in the surgery being trapped under the diaphragm.  Walk to help your body absorb the gas. This will improve in a few days. Pain at the port sites are common, especially the larger port sites. This will improve with time.  Some nausea is common and poor appetite. The main goal is to stay hydrated the first few days after surgery.   Diet/ Activity: Diet as tolerated. You may not have an appetite, but it is important to stay hydrated. Drink 64 ounces of water a day. Your appetite will return with time.  Shower per your regular routine daily.  Do not take hot showers. Take warm showers that are less than 10 minutes. Rest and listen to your body, but do not remain in bed all day.  Walk everyday for at least 15-20 minutes. Deep cough and move around every 1-2 hours in the first few days after surgery.  Do not lift > 10 lbs, perform excessive bending, pushing, pulling, squatting for 1-2 weeks after surgery.  Do not pick at the dermabond glue on your incision sites.  This glue film will remain in place for 1-2 weeks and will start to peel off.  Do not place lotions or balms on your incision unless instructed to specifically by Dr. Constance Haw.   Pain Expectations and Narcotics: -After surgery you will have pain associated with your incisions and this is normal. The pain is muscular and nerve pain, and will get better with time. -You are encouraged and expected to take non narcotic medications like tylenol and ibuprofen (when able) to treat pain as multiple modalities can aid with pain treatment. -Narcotics are only used when pain is severe or there is breakthrough pain. -You are not expected to have a  pain score of 0 after surgery, as we cannot prevent pain. A pain score of 3-4 that allows you to be functional, move, walk, and tolerate some activity is the goal. The pain will continue to improve over the days after surgery and is dependent on your surgery. -Due to [place] law, we are only able to give a certain amount of pain medication to treat post operative pain, and we only give additional narcotics on a patient by patient basis.  -For most laparoscopic surgery, studies have shown that the majority of patients only need 10-15 narcotic pills, and for open surgeries most patients only need 15-20.   -Having appropriate expectations of pain and knowledge of pain management with non narcotics is important as we do not want anyone to become addicted to narcotic pain medication.  -Using ice packs in the first 48 hours and heating pads after 48 hours, wearing an abdominal binder (when recommended), and using over the counter medications are all ways to help with pain management.   -Simple acts like meditation and mindfulness practices after surgery can also help with pain control and research has proven the benefit of these practices.  Medication: Take tylenol and ibuprofen as needed for pain control, alternating every 4-6 hours.  Example:  Tylenol 1000mg  @ 6am, 12noon, 6pm, 33midnight (Do not exceed 4000mg  of tylenol a day). Ibuprofen 800mg  @ 9am, 3pm, 9pm, 3am (Do not exceed 3600mg  of ibuprofen a day).  Take Hydrocodone for breakthrough  pain every 4 hours but do not exceed 4000mg  of tylenol a day (325mg  in every hydrocodone).  Take Colace for constipation related to narcotic pain medication. If you do not have a bowel movement in 2 days, take Miralax over the counter.  Drink plenty of water to also prevent constipation.   Contact Information: If you have questions or concerns, please call our office, [phone], Monday- Thursday 8AM-5PM and Friday 8AM-12Noon.  If it is after hours or on the weekend,  please call [facility] Main Number, [phone], and ask to speak to the surgeon on call for Dr. Constance Haw at [facility].    Laparoscopic Cholecystectomy, Care After This sheet gives you information about how to care for yourself after your procedure. Your doctor may also give you more specific instructions. If you have problems or questions, contact your doctor. Follow these instructions at home: Care for cuts from surgery (incisions)   Follow instructions from your doctor about how to take care of your cuts from surgery. Make sure you: ? Wash your hands with soap and water before you change your bandage (dressing). If you cannot use soap and water, use hand sanitizer. ? Change your bandage as told by your doctor. ? Leave stitches (sutures), skin glue, or skin tape (adhesive) strips in place. They may need to stay in place for 2 weeks or longer. If tape strips get loose and curl up, you may trim the loose edges. Do not remove tape strips completely unless your doctor says it is okay.  Do not take baths, swim, or use a hot tub until your doctor says it is okay.  You may shower.  Check your surgical cut area every day for signs of infection. Check for: ? More redness, swelling, or pain. ? More fluid or blood. ? Warmth. ? Pus or a bad smell. Activity  Do not drive or use heavy machinery while taking prescription pain medicine.  Do not lift anything that is heavier than 10 lb (4.5 kg) until your doctor says it is okay.  Do not play contact sports until your doctor says it is okay.  Do not drive for 24 hours if you were given a medicine to help you relax (sedative).  Rest as needed. Do not return to work or school until your doctor says it is okay. General instructions  Take over-the-counter and prescription medicines only as told by your doctor.  To prevent or treat constipation while you are taking prescription pain medicine, your doctor may recommend that you: ? Drink enough fluid to  keep your pee (urine) clear or pale yellow. ? Take over-the-counter or prescription medicines. ? Eat foods that are high in fiber, such as fresh fruits and vegetables, whole grains, and beans. ? Limit foods that are high in fat and processed sugars, such as fried and sweet foods. Contact a doctor if:  You develop a rash.  You have more redness, swelling, or pain around your surgical cuts.  You have more fluid or blood coming from your surgical cuts.  Your surgical cuts feel warm to the touch.  You have pus or a bad smell coming from your surgical cuts.  You have a fever.  One or more of your surgical cuts breaks open. Get help right away if:  You have trouble breathing.  You have chest pain.  You have pain that is getting worse in your shoulders.  You faint or feel dizzy when you stand.  You have very bad pain in your belly (  abdomen).  You are sick to your stomach (nauseous) for more than one day.  You have throwing up (vomiting) that lasts for more than one day.  You have leg pain. This information is not intended to replace advice given to you by your health care provider. Make sure you discuss any questions you have with your health care provider. Document Revised: 12/11/2016 Document Reviewed: 06/17/2015 Elsevier Patient Education  2020 [place] Anesthesia, Adult, Care After This sheet gives you information about how to care for yourself after your procedure. Your health care provider may also give you more specific instructions. If you have problems or questions, contact your health care provider. What can I expect after the procedure? After the procedure, the following side effects are common:  Pain or discomfort at the IV site.  Nausea.  Vomiting.  Sore throat.  Trouble concentrating.  Feeling cold or chills.  Weak or tired.  Sleepiness and fatigue.  Soreness and body aches. These side effects can affect parts of the body that  were not involved in surgery. Follow these instructions at home:  For at least 24 hours after the procedure:  Have a responsible adult stay with you. It is important to have someone help care for you until you are awake and alert.  Rest as needed.  Do not: ? Participate in activities in which you could fall or become injured. ? Drive. ? Use heavy machinery. ? Drink alcohol. ? Take sleeping pills or medicines that cause drowsiness. ? Make important decisions or sign legal documents. ? Take care of children on your own. Eating and drinking  Follow any instructions from your health care provider about eating or drinking restrictions.  When you feel hungry, start by eating small amounts of foods that are soft and easy to digest (bland), such as toast. Gradually return to your regular diet.  Drink enough fluid to keep your urine pale yellow.  If you vomit, rehydrate by drinking water, juice, or clear broth. General instructions  If you have sleep apnea, surgery and certain medicines can increase your risk for breathing problems. Follow instructions from your health care provider about wearing your sleep device: ? Anytime you are sleeping, including during daytime naps. ? While taking prescription pain medicines, sleeping medicines, or medicines that make you drowsy.  Return to your normal activities as told by your health care provider. Ask your health care provider what activities are safe for you.  Take over-the-counter and prescription medicines only as told by your health care provider.  If you smoke, do not smoke without supervision.  Keep all follow-up visits as told by your health care provider. This is important. Contact a health care provider if:  You have nausea or vomiting that does not get better with medicine.  You cannot eat or drink without vomiting.  You have pain that does not get better with medicine.  You are unable to pass urine.  You develop a skin  rash.  You have a fever.  You have redness around your IV site that gets worse. Get help right away if:  You have difficulty breathing.  You have chest pain.  You have blood in your urine or stool, or you vomit blood. Summary  After the procedure, it is common to have a sore throat or nausea. It is also common to feel tired.  Have a responsible adult stay with you for the first 24 hours after general anesthesia. It is important to  have someone help care for you until you are awake and alert.  When you feel hungry, start by eating small amounts of foods that are soft and easy to digest (bland), such as toast. Gradually return to your regular diet.  Drink enough fluid to keep your urine pale yellow.  Return to your normal activities as told by your health care provider. Ask your health care provider what activities are safe for you. This information is not intended to replace advice given to you by your health care provider. Make sure you discuss any questions you have with your health care provider. Document Revised: 01/01/2017 Document Reviewed: 08/14/2016 Elsevier Patient Education  [place].

## 2019-11-27 NOTE — Progress Notes (Signed)
[facility]  Spoke with son and notified surgery completed. Rx to [facility]. Will call post op 11/30.  Curlene Labrum, MD [facility] [address] [place], [place] [address] [phone] (office)

## 2019-11-27 NOTE — Anesthesia Postprocedure Evaluation (Signed)
Anesthesia Post Note  Patient: Laurie Bridges  Procedure(s) Performed: LAPAROSCOPIC CHOLECYSTECTOMY (N/A Abdomen)  Patient location during evaluation: PACU Anesthesia Type: General Level of consciousness: awake and alert Pain management: pain level controlled Vital Signs Assessment: post-procedure vital signs reviewed and stable Respiratory status: spontaneous breathing Cardiovascular status: stable Postop Assessment: no apparent nausea or vomiting Anesthetic complications: no   No complications documented.   Last Vitals:  Vitals:   11/27/19 0825 11/27/19 0830  BP: (!) 153/71 (!) 149/76  Pulse: 95 80  Resp: (!) 6 10  Temp: (!) 36.4 C   SpO2: 95% 95%    Last Pain:  Vitals:   11/27/19 0825  TempSrc:   PainSc: 0-No pain                 Karna Dupes

## 2019-11-27 NOTE — Anesthesia Procedure Notes (Signed)
Procedure Name: Intubation Date/Time: 11/27/2019 7:31 AM Performed by: Karna Dupes, CRNA Pre-anesthesia Checklist: Patient identified, Emergency Drugs available, Suction available and Patient being monitored Patient Re-evaluated:Patient Re-evaluated prior to induction Oxygen Delivery Method: Circle system utilized Preoxygenation: Pre-oxygenation with 100% oxygen Induction Type: IV induction Ventilation: Mask ventilation without difficulty Laryngoscope Size: Mac and 3 Grade View: Grade I Tube type: Oral Number of attempts: 1 Airway Equipment and Method: Stylet Placement Confirmation: ETT inserted through vocal cords under direct vision,  positive ETCO2 and breath sounds checked- equal and bilateral Secured at: 21 cm Tube secured with: Tape Dental Injury: Teeth and Oropharynx as per pre-operative assessment

## 2019-11-28 ENCOUNTER — Ambulatory Visit (HOSPITAL_COMMUNITY): Admission: RE | Admit: 2019-11-28 | Payer: Medicare Other | Source: Home / Self Care | Admitting: Gastroenterology

## 2019-11-28 ENCOUNTER — Encounter (HOSPITAL_COMMUNITY): Admission: RE | Payer: Self-pay | Source: Home / Self Care

## 2019-11-28 ENCOUNTER — Encounter (HOSPITAL_COMMUNITY): Payer: Self-pay | Admitting: General Surgery

## 2019-11-28 LAB — SURGICAL PATHOLOGY

## 2019-11-28 SURGERY — COLONOSCOPY WITH PROPOFOL
Anesthesia: Moderate Sedation

## 2019-12-04 ENCOUNTER — Telehealth: Payer: Self-pay | Admitting: Family Medicine

## 2019-12-04 MED ORDER — PROMETHAZINE HCL 12.5 MG PO TABS: 12.5000 mg | ORAL_TABLET | Freq: Four times a day (QID) | ORAL | 0 refills | Status: DC | PRN

## 2019-12-04 NOTE — Telephone Encounter (Signed)
[facility]  Sent in Phenergan 12.5 q 6 PRN for refractory nausea and vomiting. Can we make sure she is doing well otherwise with pain and no fevers etc.  She is 1 week out from surgery.  Curlene Labrum, MD [facility] [address] [place], [place] [address] [phone] (office)

## 2019-12-04 NOTE — Telephone Encounter (Signed)
Spoke to pt - informed of med sent to pharm. Pt states that she is not having any unusual pain or fever. She is just having some nausea without vomiting.

## 2019-12-04 NOTE — Telephone Encounter (Signed)
Pt called and states that she is still having some nausea but the Zofran is not helping and was wondering if you would send her in Phenergan instead?  Counsellor)

## 2019-12-12 ENCOUNTER — Ambulatory Visit: Payer: Medicare Other | Admitting: General Surgery

## 2019-12-19 ENCOUNTER — Encounter: Payer: Self-pay | Admitting: General Surgery

## 2019-12-19 ENCOUNTER — Other Ambulatory Visit: Payer: Self-pay

## 2019-12-19 ENCOUNTER — Ambulatory Visit (INDEPENDENT_AMBULATORY_CARE_PROVIDER_SITE_OTHER): Payer: Medicare Other | Admitting: General Surgery

## 2019-12-19 VITALS — BP 144/85 | HR 76 | Temp 98.4°F | Resp 14 | Ht 66.5 in | Wt 154.0 lb

## 2019-12-19 DIAGNOSIS — K8020 Calculus of gallbladder without cholecystitis without obstruction: Secondary | ICD-10-CM

## 2019-12-19 NOTE — Patient Instructions (Signed)
Activity as tolerated. It is ok to bowl.

## 2019-12-19 NOTE — Progress Notes (Signed)
[facility] Note   HPI:  74 y.o. Female presents to clinic for post-op follow-up evaluation after laparoscopic cholecystectomy. Patient reports she is doing well and her nausea is resolved. She is trying to get back to normal activity.   Review of Systems:  No fever or chills Incisions healing All other review of systems: otherwise negative   pathology FINAL MICROSCOPIC DIAGNOSIS:   A. GALLBLADDER, CHOLECYSTECTOMY:  - Chronic cholecystitis and cholelithiasis   Vital Signs:  BP (!) 144/85   Pulse 76   Temp 98.4 F (36.9 C) (Oral)   Resp 14   Ht 5' 6.5" (1.689 m)   Wt 154 lb (69.9 kg)   SpO2 96%   BMI 24.48 kg/m    Physical Exam:  Physical Exam Vitals reviewed.  Cardiovascular:     Rate and Rhythm: Normal rate.  Pulmonary:     Effort: Pulmonary effort is normal.  Abdominal:     General: There is no distension.     Palpations: Abdomen is soft.     Comments: Port sites healing, no erythema or drainage  Neurological:     Mental Status: She is alert.     Assessment:  74 y.o. yo Female s/p laparoscopic cholecystectomy.  Plan:  Activity as tolerated. It is ok to bowl. PRN follow up    Curlene Labrum, MD [facility] [address] [place], [place] [address] [phone] (office)

## 2020-02-12 ENCOUNTER — Telehealth (INDEPENDENT_AMBULATORY_CARE_PROVIDER_SITE_OTHER): Payer: Self-pay | Admitting: Gastroenterology

## 2020-02-12 NOTE — Telephone Encounter (Signed)
Noted I will take care of this one

## 2020-02-12 NOTE — Telephone Encounter (Signed)
Would you mind looking in to this? Let me know if I need to do anything. Thanks

## 2020-02-12 NOTE — Telephone Encounter (Signed)
Patient left voice mail message wanting to re-schedule a colonoscopy - had one scheduled in November but had to cancel - please advise - ph# [phone]

## 2020-02-13 ENCOUNTER — Encounter (INDEPENDENT_AMBULATORY_CARE_PROVIDER_SITE_OTHER): Payer: Self-pay

## 2020-02-13 ENCOUNTER — Telehealth (INDEPENDENT_AMBULATORY_CARE_PROVIDER_SITE_OTHER): Payer: Self-pay

## 2020-02-13 ENCOUNTER — Other Ambulatory Visit (INDEPENDENT_AMBULATORY_CARE_PROVIDER_SITE_OTHER): Payer: Self-pay

## 2020-02-13 DIAGNOSIS — Z1211 Encounter for screening for malignant neoplasm of colon: Secondary | ICD-10-CM

## 2020-02-13 MED ORDER — PLENVU 140 G PO SOLR: 280.0000 g | Freq: Once | ORAL | 0 refills | Status: AC

## 2020-02-13 NOTE — Telephone Encounter (Signed)
LeighAnn Shaun Zuccaro, CMA  

## 2020-02-26 DIAGNOSIS — M1612 Unilateral primary osteoarthritis, left hip: Secondary | ICD-10-CM | POA: Diagnosis not present

## 2020-02-26 DIAGNOSIS — S43203A Unspecified subluxation of unspecified sternoclavicular joint, initial encounter: Secondary | ICD-10-CM | POA: Diagnosis not present

## 2020-02-26 DIAGNOSIS — M25512 Pain in left shoulder: Secondary | ICD-10-CM | POA: Diagnosis not present

## 2020-02-27 ENCOUNTER — Other Ambulatory Visit (HOSPITAL_COMMUNITY)
Admission: RE | Admit: 2020-02-27 | Discharge: 2020-02-27 | Disposition: A | Discharge status: Home / Self Care | Payer: Medicare Other | Source: Ambulatory Visit | Attending: Internal Medicine | Admitting: Internal Medicine

## 2020-02-27 ENCOUNTER — Other Ambulatory Visit: Payer: Self-pay

## 2020-02-27 DIAGNOSIS — Z01812 Encounter for preprocedural laboratory examination: Secondary | ICD-10-CM | POA: Insufficient documentation

## 2020-02-27 DIAGNOSIS — Z20822 Contact with and (suspected) exposure to covid-19: Secondary | ICD-10-CM | POA: Insufficient documentation

## 2020-02-27 LAB — SARS CORONAVIRUS 2 (TAT 6-24 HRS): SARS Coronavirus 2: NEGATIVE

## 2020-02-29 ENCOUNTER — Encounter (HOSPITAL_COMMUNITY)
Admission: RE | Disposition: A | Discharge status: Home / Self Care | Payer: Self-pay | Source: Home / Self Care | Attending: Internal Medicine

## 2020-02-29 ENCOUNTER — Other Ambulatory Visit: Payer: Self-pay

## 2020-02-29 ENCOUNTER — Ambulatory Visit (HOSPITAL_COMMUNITY)
Admission: RE | Admit: 2020-02-29 | Discharge: 2020-02-29 | Disposition: A | Discharge status: Home / Self Care | Payer: Medicare Other | Attending: Internal Medicine | Admitting: Internal Medicine

## 2020-02-29 ENCOUNTER — Encounter (HOSPITAL_COMMUNITY): Payer: Self-pay | Admitting: Internal Medicine

## 2020-02-29 DIAGNOSIS — Z888 Allergy status to other drugs, medicaments and biological substances status: Secondary | ICD-10-CM | POA: Insufficient documentation

## 2020-02-29 DIAGNOSIS — Z79899 Other long term (current) drug therapy: Secondary | ICD-10-CM | POA: Insufficient documentation

## 2020-02-29 DIAGNOSIS — K644 Residual hemorrhoidal skin tags: Secondary | ICD-10-CM | POA: Diagnosis not present

## 2020-02-29 DIAGNOSIS — K6289 Other specified diseases of anus and rectum: Secondary | ICD-10-CM | POA: Diagnosis not present

## 2020-02-29 DIAGNOSIS — Z809 Family history of malignant neoplasm, unspecified: Secondary | ICD-10-CM | POA: Diagnosis not present

## 2020-02-29 DIAGNOSIS — Z9049 Acquired absence of other specified parts of digestive tract: Secondary | ICD-10-CM | POA: Insufficient documentation

## 2020-02-29 DIAGNOSIS — Z1211 Encounter for screening for malignant neoplasm of colon: Secondary | ICD-10-CM | POA: Insufficient documentation

## 2020-02-29 DIAGNOSIS — Z885 Allergy status to narcotic agent status: Secondary | ICD-10-CM | POA: Insufficient documentation

## 2020-02-29 DIAGNOSIS — I2510 Atherosclerotic heart disease of native coronary artery without angina pectoris: Secondary | ICD-10-CM | POA: Insufficient documentation

## 2020-02-29 DIAGNOSIS — Z90711 Acquired absence of uterus with remaining cervical stump: Secondary | ICD-10-CM | POA: Diagnosis not present

## 2020-02-29 DIAGNOSIS — I10 Essential (primary) hypertension: Secondary | ICD-10-CM | POA: Diagnosis not present

## 2020-02-29 DIAGNOSIS — K219 Gastro-esophageal reflux disease without esophagitis: Secondary | ICD-10-CM | POA: Insufficient documentation

## 2020-02-29 DIAGNOSIS — D122 Benign neoplasm of ascending colon: Secondary | ICD-10-CM | POA: Insufficient documentation

## 2020-02-29 DIAGNOSIS — Z7982 Long term (current) use of aspirin: Secondary | ICD-10-CM | POA: Insufficient documentation

## 2020-02-29 DIAGNOSIS — Z91040 Latex allergy status: Secondary | ICD-10-CM | POA: Diagnosis not present

## 2020-02-29 HISTORY — PX: COLONOSCOPY: SHX5424

## 2020-02-29 HISTORY — PX: POLYPECTOMY: SHX5525

## 2020-02-29 LAB — HM COLONOSCOPY

## 2020-02-29 SURGERY — COLONOSCOPY
Anesthesia: Moderate Sedation

## 2020-02-29 MED ORDER — SODIUM CHLORIDE 0.9 % IV SOLN: INTRAVENOUS | Status: DC

## 2020-02-29 MED ORDER — MEPERIDINE HCL 50 MG/ML IJ SOLN
INTRAMUSCULAR | Status: AC
  Filled 2020-02-29: qty 1

## 2020-02-29 MED ORDER — FENTANYL CITRATE (PF) 100 MCG/2ML IJ SOLN
INTRAMUSCULAR | Status: AC
  Filled 2020-02-29: qty 2

## 2020-02-29 MED ORDER — MIDAZOLAM HCL 5 MG/5ML IJ SOLN
INTRAMUSCULAR | Status: AC
  Filled 2020-02-29: qty 10

## 2020-02-29 MED ORDER — FENTANYL CITRATE (PF) 100 MCG/2ML IJ SOLN
INTRAMUSCULAR | Status: DC | PRN
  Administered 2020-02-29 (×2): 25 ug via INTRAVENOUS

## 2020-02-29 MED ORDER — STERILE WATER FOR IRRIGATION IR SOLN
Status: DC | PRN
  Administered 2020-02-29: 100 mL

## 2020-02-29 MED ORDER — MIDAZOLAM HCL 5 MG/5ML IJ SOLN
INTRAMUSCULAR | Status: DC | PRN
  Administered 2020-02-29: 1 mg via INTRAVENOUS
  Administered 2020-02-29 (×2): 2 mg via INTRAVENOUS

## 2020-02-29 NOTE — H&P (Signed)
Laurie Bridges is an 75 y.o. female.   Chief Complaint: Patient is here for colonoscopy HPI: Patient is 75 year old Caucasian female who is here for screening colonoscopy.  Last exam was normal in July 2011.  She denies abdominal pain change in bowel habits or rectal bleeding. Patient had laparoscopic cholecystectomy in November 2021.  She has noted significant improvement.  Nausea is just about gone and she is also having infrequent heartburn. She is on low-dose aspirin which is on hold. Family history is negative for CRC.  Past Medical History:  Diagnosis Date  . Anemia    hx of Fe def many years ago  . Anxiety   . Arthritis    back  . CAD (coronary artery disease)    a. s/p Coronary CT in 06/2018 showing mild stenosis along the mid-LAD, mid-LCx and mid-RCA  . GERD (gastroesophageal reflux disease)   . Hypertension   . PONV (postoperative nausea and vomiting)     Past Surgical History:  Procedure Laterality Date  . ABDOMINAL HYSTERECTOMY     partial hysterectomy, ovaries remain  . BACK SURGERY    . CHOLECYSTECTOMY N/A 11/27/2019   Procedure: LAPAROSCOPIC CHOLECYSTECTOMY;  Surgeon: Virl Cagey, MD;  Location: [facility];  Service: General;  Laterality: N/A;  . ESOPHAGEAL DILATION N/A 11/17/2017   Procedure: ESOPHAGEAL DILATION;  Surgeon: Rogene Houston, MD;  Location: [facility] SUITE;  Service: Endoscopy;  Laterality: N/A;  . ESOPHAGOGASTRODUODENOSCOPY N/A 11/17/2017   Procedure: ESOPHAGOGASTRODUODENOSCOPY (EGD);  Surgeon: Rogene Houston, MD;  Location: [facility];  Service: Endoscopy;  Laterality: N/A;  12:00  . ESOPHAGOGASTRODUODENOSCOPY N/A 11/02/2019   Procedure: ESOPHAGOGASTRODUODENOSCOPY (EGD);  Surgeon: Rogene Houston, MD;  Location: [facility];  Service: Endoscopy;  Laterality: N/A;  945  . FOOT SURGERY      Family History  Problem Relation Age of Onset  . Stroke Mother   . Stroke Father   . Cancer Maternal Uncle   . Cancer Paternal Uncle   . Cancer  Paternal Uncle    Social History:  reports that she has never smoked. She has never used smokeless tobacco. She reports that she does not drink alcohol and does not use drugs.  Allergies:  Allergies  Allergen Reactions  . Codeine Other (See Comments)    Syncope   . Demerol Rash  . Elemental Sulfur Rash  . Feldene [Piroxicam] Rash  . Latex Rash  . Pyridium [Phenazopyridine Hcl] Itching and Rash    Medications Prior to Admission  Medication Sig Dispense Refill  . amLODipine (NORVASC) 5 MG tablet Take 1 tablet (5 mg total) by mouth daily. (Patient taking differently: Take 2.5 mg by mouth daily.) 30 tablet 0  . aspirin EC 81 MG tablet Take 1 tablet (81 mg total) by mouth daily. (Patient taking differently: Take 81 mg by mouth at bedtime.) 90 tablet 3  . dexlansoprazole (DEXILANT) 60 MG capsule Take 1 capsule (60 mg total) by mouth daily. 30 capsule 11  . diphenhydrAMINE HCl, Sleep, (UNISOM SLEEPGELS) 50 MG CAPS Take 50 mg by mouth at bedtime.    . famotidine-calcium carbonate-magnesium hydroxide (PEPCID COMPLETE) 10-800-165 MG chewable tablet Chew 1 tablet by mouth daily as needed (acid reflux).    . fluticasone (FLONASE) 50 MCG/ACT nasal spray Place 2 sprays into both nostrils daily.    . rosuvastatin (CRESTOR) 40 MG tablet Take 1 tablet (40 mg total) by mouth daily. (Patient taking differently: Take 40 mg by mouth at bedtime.) 90 tablet 3  .  ondansetron (ZOFRAN) 4 MG tablet Take 1 tablet (4 mg total) by mouth every 8 (eight) hours as needed. (Patient not taking: Reported on 02/22/2020) 30 tablet 1  . promethazine (PHENERGAN) 12.5 MG tablet Take 1 tablet (12.5 mg total) by mouth every 6 (six) hours as needed for refractory nausea / vomiting. (Patient not taking: Reported on 02/22/2020) 30 tablet 0    Results for orders placed or performed during the hospital encounter of 02/27/20 (from the past 48 hour(s))  SARS CORONAVIRUS 2 (TAT 6-24 HRS) Nasopharyngeal Nasopharyngeal Swab     Status: None    Collection Time: 02/27/20  8:28 AM   Specimen: Nasopharyngeal Swab  Result Value Ref Range   SARS Coronavirus 2 NEGATIVE NEGATIVE    Comment: (NOTE) SARS-CoV-2 target nucleic acids are NOT DETECTED.  The SARS-CoV-2 RNA is generally detectable in upper and lower respiratory specimens during the acute phase of infection. Negative results do not preclude SARS-CoV-2 infection, do not rule out co-infections with other pathogens, and should not be used as the sole basis for treatment or other patient management decisions. Negative results must be combined with clinical observations, patient history, and epidemiological information. The expected result is Negative.  Fact Sheet for Patients: SugarRoll.be  Fact Sheet for Healthcare Providers: https://www.woods-mathews.com/  This test is not yet approved or cleared by the [place] FDA and  has been authorized for detection and/or diagnosis of SARS-CoV-2 by FDA under an Emergency Use Authorization (EUA). This EUA will remain  in effect (meaning this test can be used) for the duration of the COVID-19 declaration under Se ction 564(b)(1) of the Act, 21 U.S.C. section 360bbb-3(b)(1), unless the authorization is terminated or revoked sooner.  Performed at [facility], [place] [address] [place], [place] [address]    No results found.  Review of Systems  Blood pressure 127/83, pulse 86, temperature 97.9 F (36.6 C), temperature source Oral, resp. rate 16, height 5' 6.5" (1.689 m), weight 68.9 kg, SpO2 100 %. Physical Exam HENT:     Mouth/Throat:     Mouth: Mucous membranes are moist.     Pharynx: Oropharynx is clear.  Eyes:     General: No scleral icterus.    Conjunctiva/sclera: Conjunctivae normal.  Cardiovascular:     Rate and Rhythm: Normal rate and regular rhythm.     Heart sounds: Normal heart sounds. No murmur heard.   Pulmonary:     Effort: Pulmonary effort is normal.      Breath sounds: Normal breath sounds.  Abdominal:     Comments: Abdomen is symmetrical and soft.  She has mild midepigastric tenderness.  No organomegaly or masses.  Musculoskeletal:        General: No swelling.     Cervical back: Neck supple.  Lymphadenopathy:     Cervical: No cervical adenopathy.  Skin:    General: Skin is warm and dry.  Neurological:     Mental Status: She is alert.      Assessment/Plan  Average risk screening colonoscopy.  Hildred Laser, MD 02/29/2020, 8:20 AM

## 2020-02-29 NOTE — Op Note (Signed)
[facility] Patient Name: Laurie Bridges Procedure Date: 02/29/2020 8:26 AM MRN: [id number] Date of Birth: [birth year] Attending MD: Hildred Laser , MD CSN: [id number] Age: 75 Admit Type: Outpatient Procedure:                Colonoscopy Indications:              Screening for colorectal malignant neoplasm Providers:                Hildred Laser, MD, Rosina Lowenstein, RN, Nelma Rothman,                            Technician Referring MD:             Asencion Noble, MD Medicines:                Fentanyl 50 micrograms IV, Midazolam 5 mg IV Complications:            No immediate complications. Estimated Blood Loss:     Estimated blood loss was minimal. Procedure:                Pre-Anesthesia Assessment:                           - Prior to the procedure, a History and Physical                            was performed, and patient medications and                            allergies were reviewed. The patient's tolerance of                            previous anesthesia was also reviewed. The risks                            and benefits of the procedure and the sedation                            options and risks were discussed with the patient.                            All questions were answered, and informed consent                            was obtained. Prior Anticoagulants: The patient has                            taken no previous anticoagulant or antiplatelet                            agents except for aspirin. ASA Grade Assessment: II                            - A patient with mild systemic disease. After  reviewing the risks and benefits, the patient was                            deemed in satisfactory condition to undergo the                            procedure.                           After obtaining informed consent, the colonoscope                            was passed under direct vision. Throughout the                             procedure, the patient's blood pressure, pulse, and                            oxygen saturations were monitored continuously. The                            PCF-[id number] ([id number]) was introduced through the                            anus and advanced to the the cecum, identified by                            appendiceal orifice and ileocecal valve. The                            colonoscopy was performed without difficulty. The                            patient tolerated the procedure well. The quality                            of the bowel preparation was excellent. The                            ileocecal valve, appendiceal orifice, and rectum                            were photographed. Scope In: 8:32:08 AM Scope Out: 8:49:42 AM Scope Withdrawal Time: 0 hours 10 minutes 38 seconds  Total Procedure Duration: 0 hours 17 minutes 34 seconds  Findings:      The perianal and digital rectal examinations were normal.      A diminutive polyp was found in the ascending colon. Biopsies were taken       with a cold forceps for histology.      The exam was otherwise normal throughout the examined colon.      External hemorrhoids were found during retroflexion. The hemorrhoids       were small.      Anal papilla(e) were hypertrophied. Impression:               -  One diminutive polyp in the ascending colon.                            Biopsied.                           - External hemorrhoids.                           - Anal papilla(e) were hypertrophied. Moderate Sedation:      Moderate (conscious) sedation was administered by the endoscopy nurse       and supervised by the endoscopist. The following parameters were       monitored: oxygen saturation, heart rate, blood pressure, CO2       capnography and response to care. Total physician intraservice time was       23 minutes. Recommendation:           - Patient has a contact number available for                            emergencies. The  signs and symptoms of potential                            delayed complications were discussed with the                            patient. Return to normal activities tomorrow.                            Written discharge instructions were provided to the                            patient.                           - Resume previous diet today.                           - Continue present medications.                           - No aspirin, ibuprofen, naproxen, or other                            non-steroidal anti-inflammatory drugs for 1 day.                           - Await pathology results.                           - Repeat colonoscopy is recommended. The                            colonoscopy date will be determined after pathology                            results from today's exam  become available for                            review. Procedure Code(s):        --- Professional ---                           [phone], Colonoscopy, flexible; with biopsy, single                            or multiple                           99153, Moderate sedation; each additional 15                            minutes intraservice time                           G0500, Moderate sedation services provided by the                            same physician or other qualified health care                            professional performing a gastrointestinal                            endoscopic service that sedation supports,                            requiring the presence of an independent trained                            observer to assist in the monitoring of the                            patient's level of consciousness and physiological                            status; initial 15 minutes of intra-service time;                            patient age 12 years or older (additional time may                            be reported with [phone], as appropriate) Diagnosis Code(s):        ---  Professional ---                           Z12.11, Encounter for screening for malignant                            neoplasm of colon  K63.5, Polyp of colon                           K62.89, Other specified diseases of anus and rectum                           K64.4, Residual hemorrhoidal skin tags CPT copyright 2019 American Medical Association. All rights reserved. The codes documented in this report are preliminary and upon coder review may  be revised to meet current compliance requirements. Hildred Laser, MD Hildred Laser, MD 02/29/2020 9:02:33 AM This report has been signed electronically. Number of Addenda: 0

## 2020-02-29 NOTE — Discharge Instructions (Signed)
Resume aspirin on 03/01/2020 Resume other medications and diet as before No driving for 24 hours Physician will call with biopsy results and further recommendations  Colon Polyps  Colon polyps are tissue growths inside the colon, which is part of the large intestine. They are one of the types of polyps that can grow in the body. A polyp may be a round bump or a mushroom-shaped growth. You could have one polyp or more than one. Most colon polyps are noncancerous (benign). However, some colon polyps can become cancerous over time. Finding and removing the polyps early can help prevent this. What are the causes? The exact cause of colon polyps is not known. What increases the risk? The following factors may make you more likely to develop this condition:  Having a family history of colorectal cancer or colon polyps.  Being older than 75 years of age.  Being younger than 75 years of age and having a significant family history of colorectal cancer or colon polyps or a genetic condition that puts you at higher risk of getting colon polyps.  Having inflammatory bowel disease, such as ulcerative colitis or Crohn's disease.  Having certain conditions passed from parent to child (hereditary conditions), such as: ? Familial adenomatous polyposis (FAP). ? Lynch syndrome. ? Turcot syndrome. ? Peutz-Jeghers syndrome. ? MUTYH-associated polyposis (MAP).  Being overweight.  Certain lifestyle factors. These include smoking cigarettes, drinking too much alcohol, not getting enough exercise, and eating a diet that is high in fat and red meat and low in fiber.  Having had childhood cancer that was treated with radiation of the abdomen. What are the signs or symptoms? Many times, there are no symptoms. If you have symptoms, they may include:  Blood coming from the rectum during a bowel movement.  Blood in the stool (feces). The blood may be bright red or very dark in color.  Pain in the  abdomen.  A change in bowel habits, such as constipation or diarrhea. How is this diagnosed? This condition is diagnosed with a colonoscopy. This is a procedure in which a lighted, flexible scope is inserted into the opening between the buttocks (anus) and then passed into the colon to examine the area. Polyps are sometimes found when a colonoscopy is done as part of routine cancer screening tests. How is this treated? This condition is treated by removing any polyps that are found. Most polyps can be removed during a colonoscopy. Those polyps will then be tested for cancer. Additional treatment may be needed depending on the results of testing. Follow these instructions at home: Eating and drinking  Eat foods that are high in fiber, such as fruits, vegetables, and whole grains.  Eat foods that are high in calcium and vitamin D, such as milk, cheese, yogurt, eggs, liver, fish, and broccoli.  Limit foods that are high in fat, such as fried foods and desserts.  Limit the amount of red meat, precooked or cured meat, or other processed meat that you eat, such as hot dogs, sausages, bacon, or meat loaves.  Limit sugary drinks.   Lifestyle  Maintain a healthy weight, or lose weight if recommended by your health care provider.  Exercise every day or as told by your health care provider.  Do not use any products that contain nicotine or tobacco, such as cigarettes, e-cigarettes, and chewing tobacco. If you need help quitting, ask your health care provider.  Do not drink alcohol if: ? Your health care provider tells you not to drink. ?  You are pregnant, may be pregnant, or are planning to become pregnant.  If you drink alcohol: ? Limit how much you use to:  0-1 drink a day for women.  0-2 drinks a day for men. ? Know how much alcohol is in your drink. In the [place] one drink equals one 12 oz bottle of beer (355 mL), one 5 oz glass of wine (148 mL), or one 1 oz glass of hard liquor (44  mL). General instructions  Take over-the-counter and prescription medicines only as told by your health care provider.  Keep all follow-up visits. This is important. This includes having regularly scheduled colonoscopies. Talk to your health care provider about when you need a colonoscopy. Contact a health care provider if:  You have new or worsening bleeding during a bowel movement.  You have new or increased blood in your stool.  You have a change in bowel habits.  You lose weight for no known reason. Summary  Colon polyps are tissue growths inside the colon, which is part of the large intestine. They are one type of polyp that can grow in the body.  Most colon polyps are noncancerous (benign), but some can become cancerous over time.  This condition is diagnosed with a colonoscopy.  This condition is treated by removing any polyps that are found. Most polyps can be removed during a colonoscopy. This information is not intended to replace advice given to you by your health care provider. Make sure you discuss any questions you have with your health care provider. Document Revised: 04/19/2019 Document Reviewed: 04/19/2019 Elsevier Patient Education  2021 [place].    Colonoscopy, Adult, Care After This sheet gives you information about how to care for yourself after your procedure. Your doctor may also give you more specific instructions. If you have problems or questions, call your doctor. What can I expect after the procedure? After the procedure, it is common to have:  A small amount of blood in your poop (stool) for 24 hours.  Some gas.  Mild cramping or bloating in your belly (abdomen). Follow these instructions at home: Eating and drinking  Drink enough fluid to keep your pee (urine) pale yellow.  Follow instructions from your doctor about what you cannot eat or drink.  Return to your normal diet as told by your doctor. Avoid heavy or fried foods that are  hard to digest.   Activity  Rest as told by your doctor.  Do not sit for a long time without moving. Get up to take short walks every 1-2 hours. This is important. Ask for help if you feel weak or unsteady.  Return to your normal activities as told by your doctor. Ask your doctor what activities are safe for you. To help cramping and bloating:  Try walking around.  Put heat on your belly as told by your doctor. Use the heat source that your doctor recommends, such as a moist heat pack or a heating pad. ? Put a towel between your skin and the heat source. ? Leave the heat on for 20-30 minutes. ? Remove the heat if your skin turns bright red. This is very important if you are unable to feel pain, heat, or cold. You may have a greater risk of getting burned.   General instructions  If you were given a medicine to help you relax (sedative) during your procedure, it can affect you for many hours. Do not drive or use machinery until your doctor says that it  is safe.  For the first 24 hours after the procedure: ? Do not sign important documents. ? Do not drink alcohol. ? Do your daily activities more slowly than normal. ? Eat foods that are soft and easy to digest.  Take over-the-counter or prescription medicines only as told by your doctor.  Keep all follow-up visits as told by your doctor. This is important. Contact a doctor if:  You have blood in your poop 2-3 days after the procedure. Get help right away if:  You have more than a small amount of blood in your poop.  You see large clumps of tissue (blood clots) in your poop.  Your belly is swollen.  You feel like you may vomit (nauseous).  You vomit.  You have a fever.  You have belly pain that gets worse, and medicine does not help your pain. Summary  After the procedure, it is common to have a small amount of blood in your poop. You may also have mild cramping and bloating in your belly.  If you were given a medicine  to help you relax (sedative) during your procedure, it can affect you for many hours. Do not drive or use machinery until your doctor says that it is safe.  Get help right away if you have a lot of blood in your poop, feel like you may vomit, have a fever, or have more belly pain. This information is not intended to replace advice given to you by your health care provider. Make sure you discuss any questions you have with your health care provider. Document Revised: 11/04/2018 Document Reviewed: 07/25/2018 Elsevier Patient Education  [place].

## 2020-03-01 LAB — SURGICAL PATHOLOGY

## 2020-03-05 ENCOUNTER — Encounter (HOSPITAL_COMMUNITY): Payer: Self-pay | Admitting: Internal Medicine

## 2020-03-12 ENCOUNTER — Encounter (INDEPENDENT_AMBULATORY_CARE_PROVIDER_SITE_OTHER): Payer: Self-pay | Admitting: *Deleted

## 2020-03-25 ENCOUNTER — Telehealth: Payer: Self-pay | Admitting: Student

## 2020-03-25 NOTE — Telephone Encounter (Signed)
     It is difficult to say what her pain at night is secondary to but her dyspnea on exertion is certainly concerning. I would recommend she schedule a visit with myself, another APP or MD (previously [facility] patient so not sure if Dr. Harrington Challenger or Dr. Johnsie Cancel have any upcoming availability in [place]). Will at minimum need an EKG at that time to assess for arrhythmia and may need to consider repeat ischemic evaluation (last evaluation was a Coronary CT in 06/2018 with mild stenosis along LAD, LCx and RCA). If symptoms progress in the interim or she develops chest pain with exertion, would recommend ED evaluation.   I am glad she gradually reduced the medication. Would remain off Amlodipine for now given her soft readings.   Signed, Erma Heritage, PA-C 03/25/2020, 4:27 PM Pager: [phone]

## 2020-03-25 NOTE — Telephone Encounter (Signed)
Contacted patient who is c/o SOB and  fluctuating hr. HR when patient is sitting is around 84bpm. HR when patient is moving around is around 112 bpm. Patient also states that she has some CP's but also admits this could be due to her hiatal hernia. Patient admitted that she stopped taking her amlodipine 5 mg tablets the week of 03/18/20. Her bp's have been: 3/13: 140/80 3/14: 108/86  Please advise.

## 2020-03-25 NOTE — Telephone Encounter (Signed)
    Is her chest pain occurring with activity? With food consumption?  Do we know why her Amlodipine was discontinued? It appears her readings have been variable and we may need to consider restarting this at a lower dose.   Signed, Erma Heritage, PA-C 03/25/2020, 3:32 PM Pager: [phone]

## 2020-03-25 NOTE — Telephone Encounter (Signed)
Pt states that she has chest pain at night in the center of the chest. Denies that pain is worse with food or activity. Reports that chest pain felt like something "flopped" in her chest. Pt states that she is unable to walk to the mailbox and back without being SOB. States that the mailbox is around 100 feet from the house. SOB started about a month ago. When pt walked up steps from basement she became SOB. Denies chest pain with SOB. Pt states that she started taking 1/2 Norvasc in November and a week ago she stopped taking Norvasc all together. She reports that she stopped because her BP was low. She states the the last day she took Norvasc her BP was 94/64. Her BP was 108/66 this morning.

## 2020-03-25 NOTE — Telephone Encounter (Signed)
Pt notified of Brittany's note and the need to be seen in office. Pt agreed to be seen on tomorrow at 10 am with Dr. Harrington Challenger.

## 2020-03-25 NOTE — Progress Notes (Signed)
Cardiology Office Note   Date:  03/26/2020   ID:  Laurie Bridges, DOB [birth year], MRN [id number]  PCP:  Asencion Noble, MD  Cardiologist:   Dorris Carnes, MD   Pt presents for f/u of chest pressure and SOB     History of Present Illness: Laurie Bridges is a 75 y.o. female with a history of CAD (s/p coronary CT angio in June 2020 showing mild narrowing in mid,LAD, mid LCx and mid RCA), HTN, MR  She was last seen by B Desmith in Aug 2021    The patient called in yesterday   She woke up with chest pressure in epigastric region   Felt her heart flip   This is the only time she had this happen  The pt also reports increased DOE   No CP with exertion   Since November she says she getts SOB with waking to mailbox.   BP was low and she cut back on amlodipine   Has stopped all together     The past couple weeks she has had allergies, congestion       Current Meds  Medication Sig  . aspirin EC 81 MG tablet Take 1 tablet (81 mg total) by mouth at bedtime.  Marland Kitchen dexlansoprazole (DEXILANT) 60 MG capsule Take 1 capsule (60 mg total) by mouth daily.  . diphenhydrAMINE HCl, Sleep, (UNISOM SLEEPGELS) 50 MG CAPS Take 50 mg by mouth at bedtime.  . famotidine-calcium carbonate-magnesium hydroxide (PEPCID COMPLETE) 10-800-165 MG chewable tablet Chew 1 tablet by mouth daily as needed (acid reflux).  . fluticasone (FLONASE) 50 MCG/ACT nasal spray Place 2 sprays into both nostrils daily.  . rosuvastatin (CRESTOR) 40 MG tablet Take 1 tablet (40 mg total) by mouth daily. (Patient taking differently: Take 40 mg by mouth at bedtime.)     Allergies:   Codeine, Demerol, Elemental sulfur, Feldene [piroxicam], Latex, and Pyridium [phenazopyridine hcl]   Past Medical History:  Diagnosis Date  . Anemia    hx of Fe def many years ago  . Anxiety   . Arthritis    back  . CAD (coronary artery disease)    a. s/p Coronary CT in 06/2018 showing mild stenosis along the mid-LAD, mid-LCx and mid-RCA  . GERD  (gastroesophageal reflux disease)   . Hypertension   . PONV (postoperative nausea and vomiting)     Past Surgical History:  Procedure Laterality Date  . ABDOMINAL HYSTERECTOMY     partial hysterectomy, ovaries remain  . BACK SURGERY    . CHOLECYSTECTOMY N/A 11/27/2019   Procedure: LAPAROSCOPIC CHOLECYSTECTOMY;  Surgeon: Virl Cagey, MD;  Location: [facility];  Service: General;  Laterality: N/A;  . COLONOSCOPY N/A 02/29/2020   Procedure: COLONOSCOPY;  Surgeon: Rogene Houston, MD;  Location: [facility];  Service: Endoscopy;  Laterality: N/A;  8:45  . ESOPHAGEAL DILATION N/A 11/17/2017   Procedure: ESOPHAGEAL DILATION;  Surgeon: Rogene Houston, MD;  Location: [facility];  Service: Endoscopy;  Laterality: N/A;  . ESOPHAGOGASTRODUODENOSCOPY N/A 11/17/2017   Procedure: ESOPHAGOGASTRODUODENOSCOPY (EGD);  Surgeon: Rogene Houston, MD;  Location: [facility];  Service: Endoscopy;  Laterality: N/A;  12:00  . ESOPHAGOGASTRODUODENOSCOPY N/A 11/02/2019   Procedure: ESOPHAGOGASTRODUODENOSCOPY (EGD);  Surgeon: Rogene Houston, MD;  Location: [facility];  Service: Endoscopy;  Laterality: N/A;  945  . FOOT SURGERY    . POLYPECTOMY  02/29/2020   Procedure: POLYPECTOMY;  Surgeon: Rogene Houston, MD;  Location: [facility];  Service:  Endoscopy;;     Social History:  The patient  reports that she has never smoked. She has never used smokeless tobacco. She reports that she does not drink alcohol and does not use drugs.   Family History:  The patient's family history includes Cancer in her maternal uncle, paternal uncle, and paternal uncle; Stroke in her father and mother.    ROS:  Please see the history of present illness. All other systems are reviewed and  Negative to the above problem except as noted.    PHYSICAL EXAM: VS:  BP 138/78   Pulse 74   Ht 5\' 6"  (1.676 m)   Wt 156 lb (70.8 kg)   SpO2 97%   BMI 25.18 kg/m   GEN: Well nourished, well developed, in no acute distress   HEENT: normal  Neck: no JVD, carotid bruits, or masses Cardiac: RRR; no murmurs, rubs, or gallops,no edema  Respiratory:  Decreased airflow posteriorly  NO wheezes  No ralres   GI: soft, nontender, nondistended, + BS  No hepatomegaly  MS: no deformity Moving all extremities   Skin: warm and dry, no rash Neuro:  Strength and sensation are intact Psych: euthymic mood, full affect   EKG:  EKG is ordered today.  SR 75 bpm      Lipid Panel    Component Value Date/Time   CHOL 139 10/26/2019 1511   TRIG 97 10/26/2019 1511   HDL 61 10/26/2019 1511   CHOLHDL 2.3 10/26/2019 1511   LDLCALC 60 10/26/2019 1511      Wt Readings from Last 3 Encounters:  03/26/20 156 lb (70.8 kg)  02/29/20 152 lb (68.9 kg)  12/19/19 154 lb (69.9 kg)      ASSESSMENT AND PLAN:  1  Chest pain/pressure   One episode occurring the other night   I do not think it represents angina   Follow   2  DOE   Concerning  But confusing.   Her lung exam shows decreased airflow posteriorly  I have reviewed PFTs from 2019   They showed mod airbvtrapping   ? If worthwhile to repeat these    I am not concvinced that DOE represents an anginal equivalent   She had mild dz in 2020 on CT   With excellent lipid control I would be pushed to think that that changed significantly    3  CAD   CT scan in 2020 with mild dz As noted above I am not convinced dyspnea represents angina    Will review PFTs before considering further cardiac testing   Continue statin  4  HL   Keep on statin   Great control of LDL   Current medicines are reviewed at length with the patient today.  The patient does not have concerns regarding medicines.  Signed, Dorris Carnes, MD  03/26/2020 10:25 AM    [address], [place], [place]  [address] Phone: [phone]; Fax: [phone]

## 2020-03-25 NOTE — Telephone Encounter (Signed)
New message   Pt c/o Shortness Of Breath: STAT if SOB developed within the last 24 hours or pt is noticeably SOB on the phone  1. Are you currently SOB (can you hear that pt is SOB on the phone)? no  2. How long have you been experiencing SOB? A month or so   3. Are you SOB when sitting or when up moving around? When moving around   4. Are you currently experiencing any other symptoms? She has been noticing that her heart rate has been elevated as well

## 2020-03-26 ENCOUNTER — Other Ambulatory Visit: Payer: Self-pay

## 2020-03-26 ENCOUNTER — Encounter: Payer: Self-pay | Admitting: Internal Medicine

## 2020-03-26 ENCOUNTER — Ambulatory Visit (INDEPENDENT_AMBULATORY_CARE_PROVIDER_SITE_OTHER): Payer: Medicare Other | Admitting: Internal Medicine

## 2020-03-26 VITALS — BP 138/78 | HR 74 | Ht 66.0 in | Wt 156.0 lb

## 2020-03-26 DIAGNOSIS — I2510 Atherosclerotic heart disease of native coronary artery without angina pectoris: Secondary | ICD-10-CM

## 2020-03-26 NOTE — Patient Instructions (Signed)
Medication Instructions:  Your physician recommends that you continue on your current medications as directed. Please refer to the Current Medication list given to you today.  *If you need a refill on your cardiac medications before your next appointment, please call your pharmacy*   Lab Work: NONE   If you have labs (blood work) drawn today and your tests are completely normal, you will receive your results only by: MyChart Message (if you have MyChart) OR A paper copy in the mail If you have any lab test that is abnormal or we need to change your treatment, we will call you to review the results.   Testing/Procedures: NONE    Follow-Up: At [facility], you and your health needs are our priority.  As part of our continuing mission to provide you with exceptional heart care, we have created designated Provider Care Teams.  These Care Teams include your primary Cardiologist (physician) and Advanced Practice Providers (APPs -  Physician Assistants and Nurse Practitioners) who all work together to provide you with the care you need, when you need it.  We recommend signing up for the patient portal called "MyChart".  Sign up information is provided on this After Visit Summary.  MyChart is used to connect with patients for Virtual Visits (Telemedicine).  Patients are able to view lab/test results, encounter notes, upcoming appointments, etc.  Non-urgent messages can be sent to your provider as well.   To learn more about what you can do with MyChart, go to [email or web address].    Your next appointment:    To Be Determined   The format for your next appointment:   In Person  Provider:   Paula Ross, MD   Other Instructions Thank you for choosing [facility]!    

## 2020-04-04 DIAGNOSIS — J343 Hypertrophy of nasal turbinates: Secondary | ICD-10-CM | POA: Diagnosis not present

## 2020-04-04 DIAGNOSIS — J310 Chronic rhinitis: Secondary | ICD-10-CM | POA: Diagnosis not present

## 2020-04-04 DIAGNOSIS — H6123 Impacted cerumen, bilateral: Secondary | ICD-10-CM | POA: Diagnosis not present

## 2020-04-04 DIAGNOSIS — J342 Deviated nasal septum: Secondary | ICD-10-CM | POA: Diagnosis not present

## 2020-04-16 DIAGNOSIS — I10 Essential (primary) hypertension: Secondary | ICD-10-CM | POA: Diagnosis not present

## 2020-04-16 DIAGNOSIS — I2510 Atherosclerotic heart disease of native coronary artery without angina pectoris: Secondary | ICD-10-CM | POA: Diagnosis not present

## 2020-04-16 DIAGNOSIS — Z79899 Other long term (current) drug therapy: Secondary | ICD-10-CM | POA: Diagnosis not present

## 2020-04-16 DIAGNOSIS — E785 Hyperlipidemia, unspecified: Secondary | ICD-10-CM | POA: Diagnosis not present

## 2020-04-18 ENCOUNTER — Ambulatory Visit: Payer: BC Managed Care – PPO | Admitting: Student

## 2020-04-23 DIAGNOSIS — I700 Atherosclerosis of aorta: Secondary | ICD-10-CM | POA: Diagnosis not present

## 2020-04-23 DIAGNOSIS — I10 Essential (primary) hypertension: Secondary | ICD-10-CM | POA: Diagnosis not present

## 2020-04-23 DIAGNOSIS — E785 Hyperlipidemia, unspecified: Secondary | ICD-10-CM | POA: Diagnosis not present

## 2020-04-23 DIAGNOSIS — Z6825 Body mass index (BMI) 25.0-25.9, adult: Secondary | ICD-10-CM | POA: Diagnosis not present

## 2020-04-23 DIAGNOSIS — I2510 Atherosclerotic heart disease of native coronary artery without angina pectoris: Secondary | ICD-10-CM | POA: Diagnosis not present

## 2020-05-16 DIAGNOSIS — M25552 Pain in left hip: Secondary | ICD-10-CM | POA: Diagnosis not present

## 2020-05-16 DIAGNOSIS — M545 Low back pain, unspecified: Secondary | ICD-10-CM | POA: Diagnosis not present

## 2020-06-05 IMAGING — CT CT ABDOMEN WO/W CM
3 of 11 series · 11 of 46 positions shown, 17 images · IV contrast (Isovue)
Comparison: Abdominal ultrasound 12/02/2017

CLINICAL DATA: Indeterminate hepatic and right renal lesions on
ultrasound. No history of malignancy.

EXAM:
CT ABDOMEN WITHOUT AND WITH CONTRAST
TECHNIQUE: Multidetector CT imaging of the abdomen was performed following the
standard protocol before and following the bolus administration of
intravenous contrast.
CONTRAST:  100mL GQPHM4-VTT IOPAMIDOL (GQPHM4-VTT) INJECTION 61%

[Series 2: axial pre · axial · non-contrast · 0.68mm/px · z∈[+1190,+1350]mm · 5 of 48 slices shown, 10 images]
[im 8/48  soft-tissue]
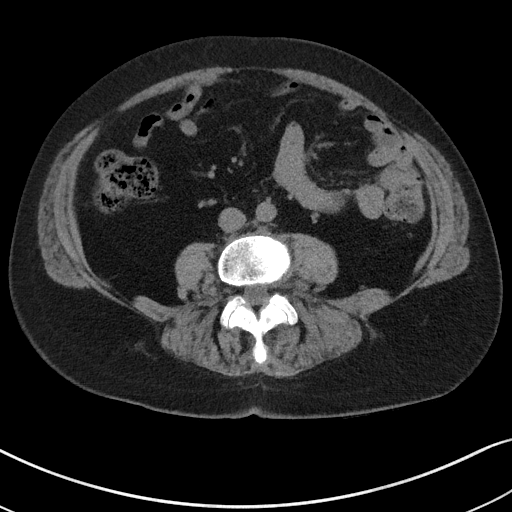
[im 8/48  bone]
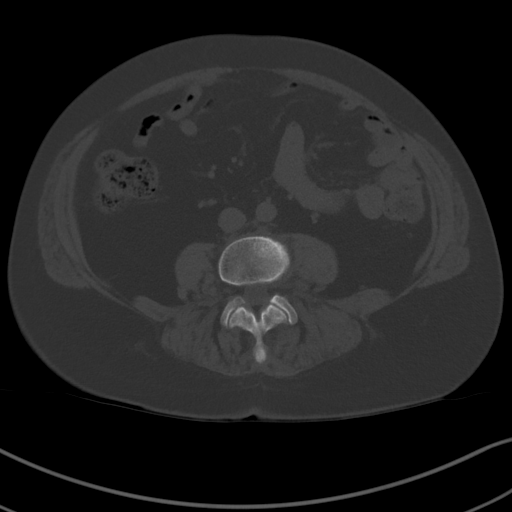
[im 16/48  soft-tissue]
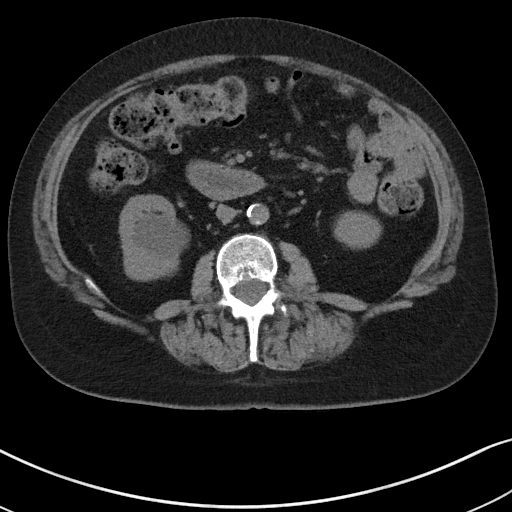
[im 16/48  lung]
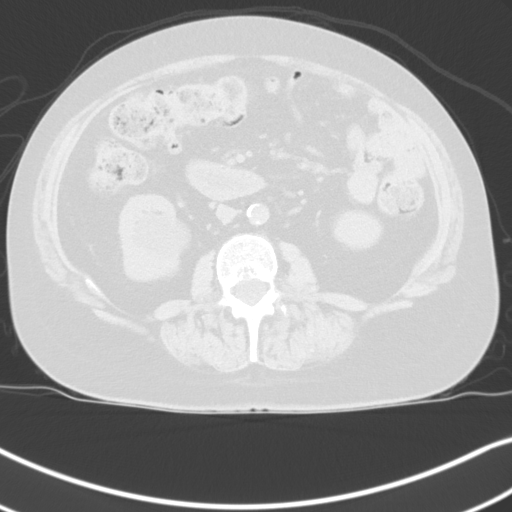
[im 24/48  soft-tissue]
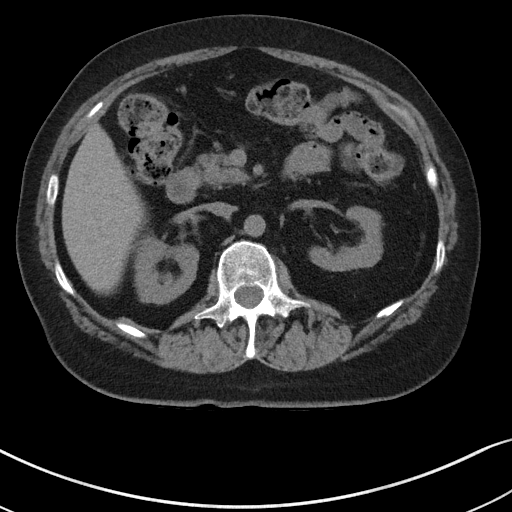
[im 24/48  lung]
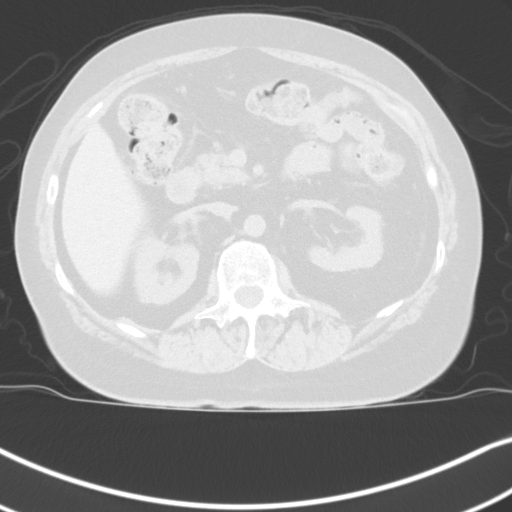
[im 32/48  soft-tissue]
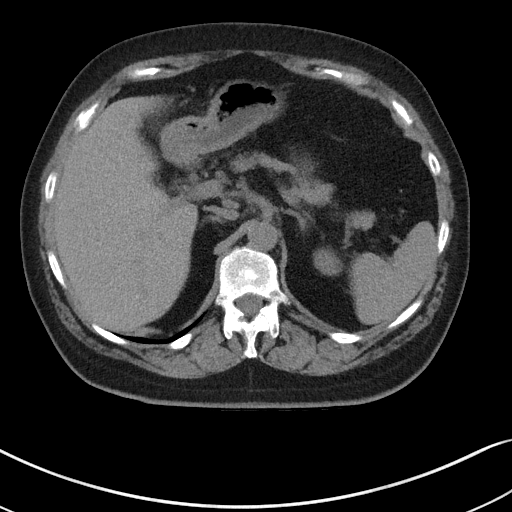
[im 32/48  lung]
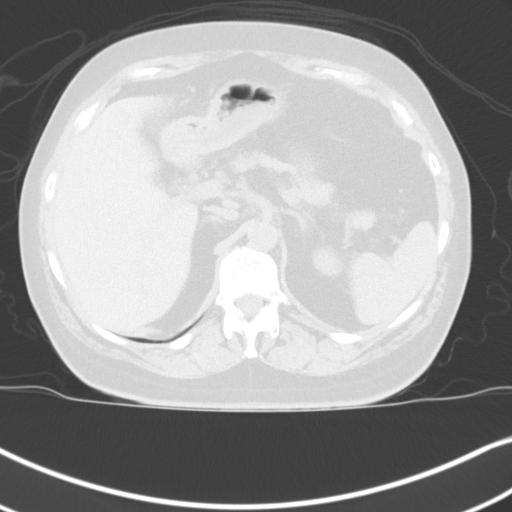
[im 40/48  soft-tissue]
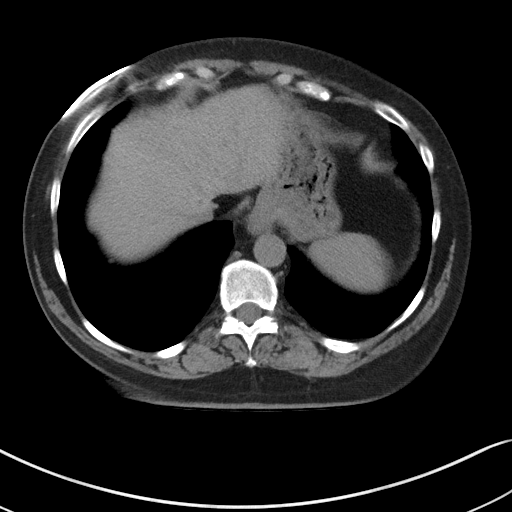
[im 40/48  lung]
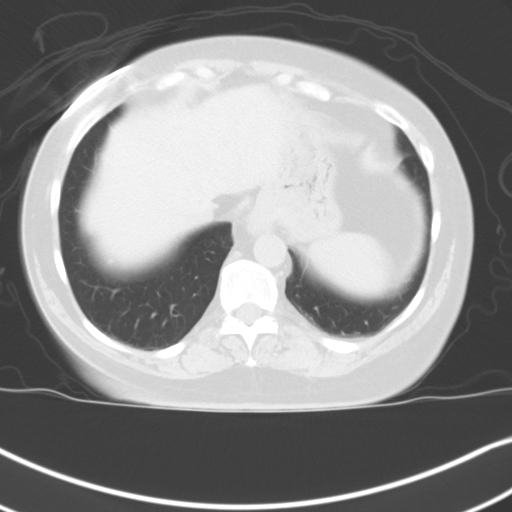

[Series 3: axial arterial · axial · arterial · 0.67mm/px · z∈[+1193,+1271]mm · 4 of 69 slices shown]
[im 9/69  soft-tissue]
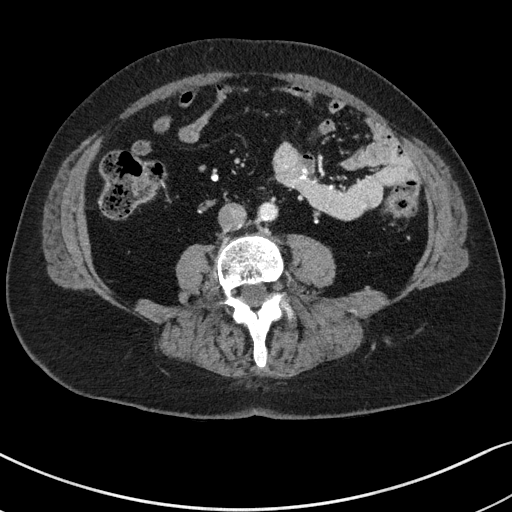
[im 18/69  soft-tissue]
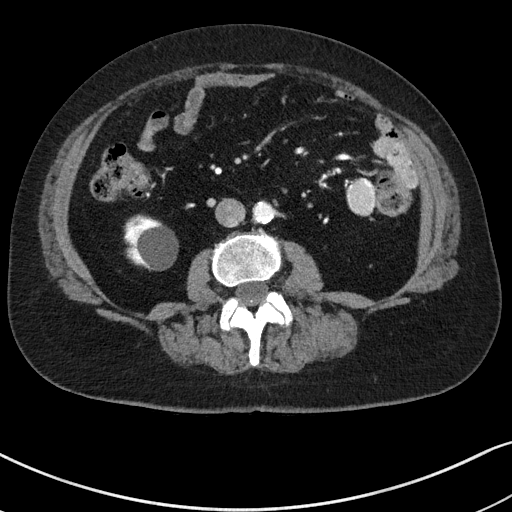
[im 26/69  soft-tissue]
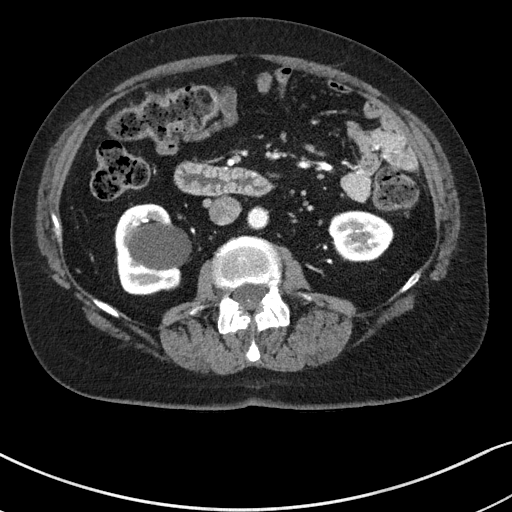
[im 35/69  soft-tissue]
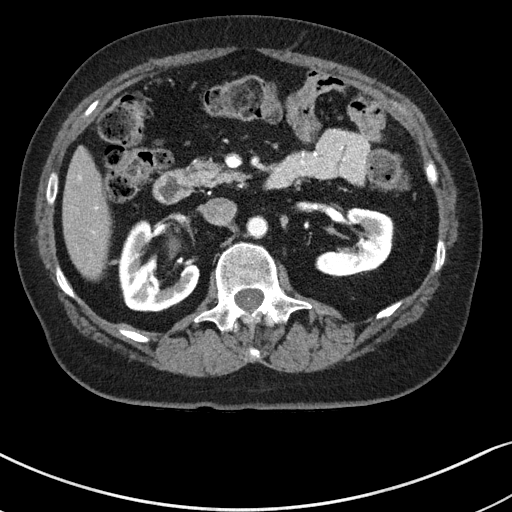

[Series 8: coronal pre · coronal · non-contrast · 0.46mm/px · 2 of 101 slices shown, 3 images]
[im 34/101  soft-tissue]
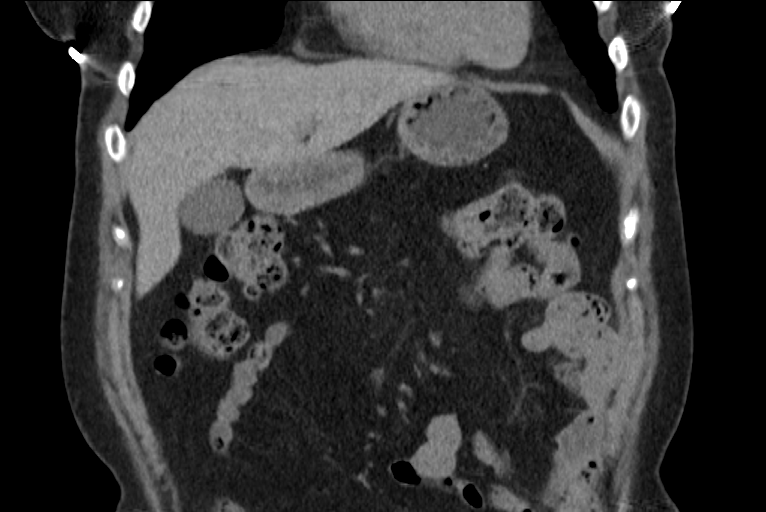
[im 34/101  bone]
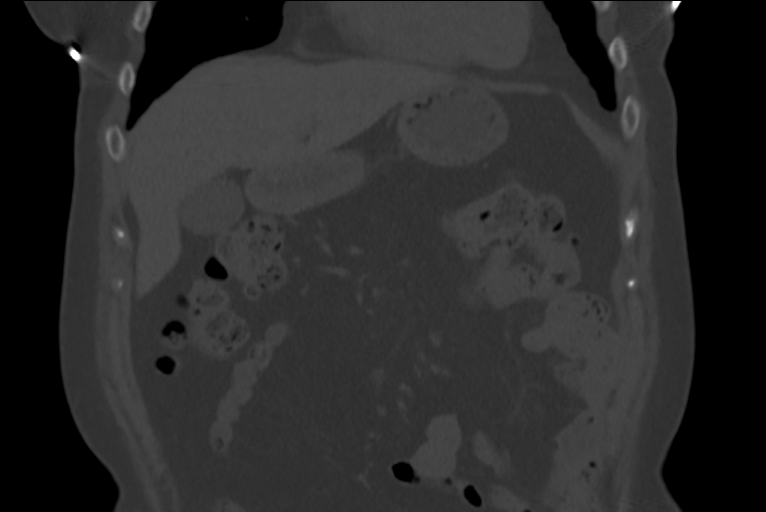
[im 67/101  soft-tissue]
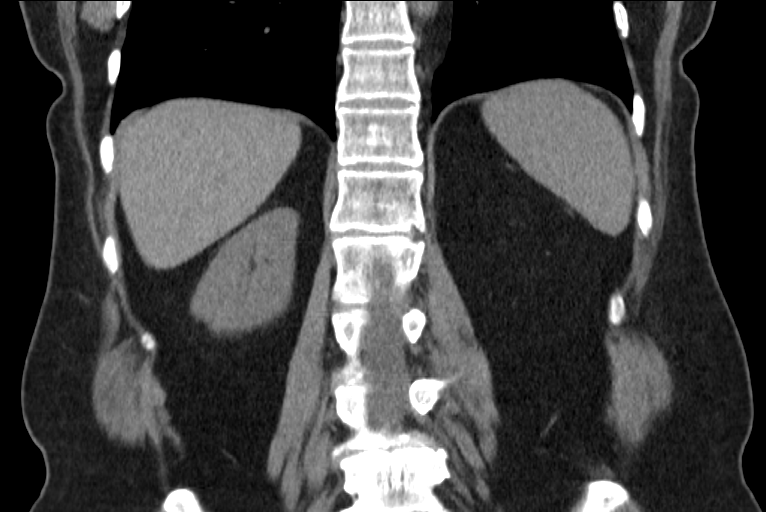

[11 of 46 positions shown; findings below may reference images not displayed]

FINDINGS: Lower chest: Clear lung bases. No significant pleural or pericardial
effusion. There is a small hiatal hernia.

Hepatobiliary: 10 mm low-density lesion anteriorly in the right
hepatic lobe (segment 5, image [DATE]) shows no significant
enhancement on the delayed post-contrast images and is likely a cyst
based on prior ultrasound appearance. There is a similar lesion in
the inferior tip of the right hepatic lobe measuring 15 mm on image
40/3. There are no suspicious hepatic findings. There are small
gallstones in the gallbladder neck. No gallbladder wall thickening
or surrounding inflammation. There is no biliary dilatation.

Pancreas: Unremarkable. No pancreatic ductal dilatation or
surrounding inflammatory changes.

Spleen: Normal in size without focal abnormality.

Adrenals/Urinary Tract: Both adrenal glands appear normal. Pre
contrast images demonstrate no renal or ureteral calculi. There are
adjacent parapelvic cysts in the interpolar and lower pole regions
of the right kidney, measuring 4.4 and 2.9 cm, respectively (image
58/14). These appear simple without thickened septations or
enhancement. No evidence of enhancing renal mass, hydronephrosis or
perinephric soft tissue stranding. There is a probable tiny cyst in
the lower pole of the left kidney.

Stomach/Bowel: No evidence of bowel wall thickening, distention or
surrounding inflammatory change.Moderate stool throughout the colon.

Vascular/Lymphatic: There are no enlarged abdominal lymph nodes.
Aortic and branch vessel atherosclerosis.

Other: No evidence of abdominal wall mass or hernia.

Musculoskeletal: No acute or significant osseous findings. There is
lower lumbar spondylosis with a right lateral disc herniation at
L4-5.
IMPRESSION: 1. No acute findings or explanation for the patient's symptoms.
2. There are 2 low-density, nonenhancing lesions in the right
hepatic lobe which are most consistent with cysts. No worrisome
hepatic findings.
3. Adjacent simple cysts in the right kidney. No suspicious renal
findings.
4. Cholelithiasis without gallbladder wall thickening or biliary
dilatation.
5. Lower lumbar spondylosis with right lateral disc herniation at
L4-5.
6.  Aortic Atherosclerosis ([id number]).

## 2020-06-11 DIAGNOSIS — M25552 Pain in left hip: Secondary | ICD-10-CM | POA: Diagnosis not present

## 2020-07-02 DIAGNOSIS — M25562 Pain in left knee: Secondary | ICD-10-CM | POA: Diagnosis not present

## 2020-07-05 DIAGNOSIS — M545 Low back pain, unspecified: Secondary | ICD-10-CM | POA: Diagnosis not present

## 2020-07-10 DIAGNOSIS — M545 Low back pain, unspecified: Secondary | ICD-10-CM | POA: Diagnosis not present

## 2020-07-23 DIAGNOSIS — M5416 Radiculopathy, lumbar region: Secondary | ICD-10-CM | POA: Diagnosis not present

## 2020-07-31 ENCOUNTER — Other Ambulatory Visit (HOSPITAL_COMMUNITY): Payer: Self-pay | Admitting: Internal Medicine

## 2020-07-31 DIAGNOSIS — Z1231 Encounter for screening mammogram for malignant neoplasm of breast: Secondary | ICD-10-CM

## 2020-08-07 DIAGNOSIS — M25552 Pain in left hip: Secondary | ICD-10-CM | POA: Diagnosis not present

## 2020-08-07 DIAGNOSIS — M5416 Radiculopathy, lumbar region: Secondary | ICD-10-CM | POA: Diagnosis not present

## 2020-08-14 DIAGNOSIS — M25552 Pain in left hip: Secondary | ICD-10-CM | POA: Diagnosis not present

## 2020-08-19 ENCOUNTER — Telehealth (INDEPENDENT_AMBULATORY_CARE_PROVIDER_SITE_OTHER): Payer: Self-pay | Admitting: *Deleted

## 2020-08-19 DIAGNOSIS — M25552 Pain in left hip: Secondary | ICD-10-CM | POA: Diagnosis not present

## 2020-08-19 NOTE — Telephone Encounter (Signed)
Pt last seen 08/10/19 by Laurine Blazer PA. States insurance stopped covering dexilant back in May. Has been using otc nexium and its not helping. Tried pepcid complete in the past and it did not help. No upcoming appt. Pt having burning in throat. Wanted to know if something could be called in.  [place] [facility].  [phone]

## 2020-08-20 ENCOUNTER — Other Ambulatory Visit (INDEPENDENT_AMBULATORY_CARE_PROVIDER_SITE_OTHER): Payer: Self-pay | Admitting: *Deleted

## 2020-08-20 ENCOUNTER — Telehealth: Payer: Self-pay | Admitting: *Deleted

## 2020-08-20 DIAGNOSIS — L738 Other specified follicular disorders: Secondary | ICD-10-CM | POA: Diagnosis not present

## 2020-08-20 DIAGNOSIS — D1801 Hemangioma of skin and subcutaneous tissue: Secondary | ICD-10-CM | POA: Diagnosis not present

## 2020-08-20 DIAGNOSIS — L821 Other seborrheic keratosis: Secondary | ICD-10-CM | POA: Diagnosis not present

## 2020-08-20 DIAGNOSIS — L814 Other melanin hyperpigmentation: Secondary | ICD-10-CM | POA: Diagnosis not present

## 2020-08-20 DIAGNOSIS — L700 Acne vulgaris: Secondary | ICD-10-CM | POA: Diagnosis not present

## 2020-08-20 MED ORDER — ESOMEPRAZOLE MAGNESIUM 40 MG PO CPDR: 40.0000 mg | DELAYED_RELEASE_CAPSULE | Freq: Every morning | ORAL | 5 refills | Status: DC

## 2020-08-20 NOTE — Telephone Encounter (Signed)
Dr. Harrington Challenger to review.  Patient had a history of mild nonobstructive CAD on previous coronary CT in June 2020.  She was last seen by Dr. Harrington Challenger in March 2022 for dyspnea on exertion.  Dr. Harrington Challenger suspected it might be more pulmonary rather than cardiac.  The note mentions possibly repeating a PFT, I do not see that this ever happened.  Talking with the patient today, she denies any recent chest pain.  She says she is not really having significant shortness of breath at this time however she can walk as far as before due to her hip issue.  Dr. Harrington Challenger, will do recommend additional work-up prior to her hip surgery.  Please forward to her response to P CV DIV PREOP

## 2020-08-20 NOTE — Telephone Encounter (Signed)
   [facility] Pre-operative Risk Assessment    Patient Name: Laurie Bridges  DOB: 08/29/1945 MRN: [id number]  HEARTCARE STAFF:  - IMPORTANT!!!!!! Under Visit Info/Reason for Call, type in Other and utilize the format Clearance MM/DD/YY or Clearance TBD. Do not use dashes or single digits. - Please review there is not already an duplicate clearance open for this procedure. - If request is for dental extraction, please clarify the # of teeth to be extracted. - If the patient is currently at the dentist's office, call Pre-Op Callback Staff (MA/nurse) to input urgent request.  - If the patient is not currently in the dentist office, please route to the Pre-Op pool.  Request for surgical clearance:  What type of surgery is being performed? Lt total hip replacement  When is this surgery scheduled? TBD  What type of clearance is required (medical clearance vs. Pharmacy clearance to hold med vs. Both)? Medical  Are there any medications that need to be held prior to surgery and how long? None listed, pt on ASA 14m  Practice name and name of physician performing surgery? [facility]; Dr CFrench Ana What is the office phone number? [phone](Kelly)   7.   What is the office fax number? [phone]:Kelly  8.   Anesthesia type (None, local, MAC, general) ? Not listed   [name]8/09/2020, 12:59 PM  _________________________________________________________________   (provider comments below)

## 2020-08-20 NOTE — Telephone Encounter (Signed)
Nexium '40mg'$  one qam #30 with 5 refills per dr Laural Golden. Med sent to pharm and pt was notified.

## 2020-08-21 NOTE — Telephone Encounter (Signed)
   Primary Cardiologist: Kate Sable, MD (Inactive)  Chart reviewed as part of pre-operative protocol coverage. Given past medical history and time since last visit, based on ACC/AHA guidelines, Laurie Bridges would be at acceptable risk for the planned procedure without further cardiovascular testing.   Her RCRI is a class II risk 0.9% risk of major cardiac event.  She is able to complete greater than 4 METS of physical activity.  Her aspirin may be held for 5 to 7 days prior to her procedure.  Please resume as soon as hemostasis is achieved.  Patient was advised that if she develops new symptoms prior to surgery to contact our office to arrange a follow-up appointment.  She verbalized understanding.  I will route this recommendation to the requesting party via Epic fax function and remove from pre-op pool.  Please call with questions.  Laurie Bridges. Laurie Street NP-C    08/21/2020, [address] Office [phone] Fax [phone]

## 2020-08-26 DIAGNOSIS — M25552 Pain in left hip: Secondary | ICD-10-CM | POA: Diagnosis not present

## 2020-08-29 ENCOUNTER — Other Ambulatory Visit: Payer: Self-pay | Admitting: Student

## 2020-09-02 DIAGNOSIS — M25552 Pain in left hip: Secondary | ICD-10-CM | POA: Diagnosis not present

## 2020-09-02 DIAGNOSIS — R791 Abnormal coagulation profile: Secondary | ICD-10-CM | POA: Diagnosis not present

## 2020-09-02 DIAGNOSIS — Z01812 Encounter for preprocedural laboratory examination: Secondary | ICD-10-CM | POA: Diagnosis not present

## 2020-09-04 DIAGNOSIS — M25552 Pain in left hip: Secondary | ICD-10-CM | POA: Diagnosis not present

## 2020-09-04 DIAGNOSIS — R262 Difficulty in walking, not elsewhere classified: Secondary | ICD-10-CM | POA: Diagnosis not present

## 2020-09-04 DIAGNOSIS — M1612 Unilateral primary osteoarthritis, left hip: Secondary | ICD-10-CM | POA: Diagnosis not present

## 2020-09-18 ENCOUNTER — Other Ambulatory Visit: Payer: Self-pay

## 2020-09-18 ENCOUNTER — Ambulatory Visit (HOSPITAL_COMMUNITY)
Admission: RE | Admit: 2020-09-18 | Discharge: 2020-09-18 | Disposition: A | Discharge status: Home / Self Care | Payer: Medicare Other | Source: Ambulatory Visit | Attending: Internal Medicine | Admitting: Internal Medicine

## 2020-09-18 DIAGNOSIS — Z1231 Encounter for screening mammogram for malignant neoplasm of breast: Secondary | ICD-10-CM | POA: Diagnosis not present

## 2020-09-19 DIAGNOSIS — M1612 Unilateral primary osteoarthritis, left hip: Secondary | ICD-10-CM | POA: Diagnosis not present

## 2020-09-23 ENCOUNTER — Ambulatory Visit (HOSPITAL_COMMUNITY): Payer: Medicare Other | Attending: Orthopedic Surgery | Admitting: Physical Therapy

## 2020-09-23 ENCOUNTER — Other Ambulatory Visit: Payer: Self-pay

## 2020-09-23 ENCOUNTER — Encounter (HOSPITAL_COMMUNITY): Payer: Self-pay | Admitting: Physical Therapy

## 2020-09-23 DIAGNOSIS — M6281 Muscle weakness (generalized): Secondary | ICD-10-CM | POA: Insufficient documentation

## 2020-09-23 DIAGNOSIS — R262 Difficulty in walking, not elsewhere classified: Secondary | ICD-10-CM | POA: Diagnosis not present

## 2020-09-23 DIAGNOSIS — R29898 Other symptoms and signs involving the musculoskeletal system: Secondary | ICD-10-CM | POA: Insufficient documentation

## 2020-09-23 DIAGNOSIS — M25552 Pain in left hip: Secondary | ICD-10-CM | POA: Insufficient documentation

## 2020-09-23 NOTE — Therapy (Signed)
[place], [place], [address] Phone: [phone]   Fax:  [phone]  Physical Therapy Evaluation  Patient Details  Name: DYLAN SORRENTINO MRN: [id number] Date of Birth: [birth year] Referring Provider (PT): Edmonia Lynch  Encounter Date: 09/23/2020   PT End of Session - 09/23/20 1446     Visit Number 1    Number of Visits 16    Date for PT Re-Evaluation 11/18/20    Authorization Type BCBS no auth or VL    Progress Note Due on Visit 10    PT Start Time 1446    PT Stop Time 1529    PT Time Calculation (min) 43 min    Activity Tolerance Patient tolerated treatment well    Behavior During Therapy [facility] for tasks assessed/performed             Past Medical History:  Diagnosis Date   Anemia    hx of Fe def many years ago   Anxiety    Arthritis    back   CAD (coronary artery disease)    a. s/p Coronary CT in 06/2018 showing mild stenosis along the mid-LAD, mid-LCx and mid-RCA   GERD (gastroesophageal reflux disease)    Hypertension    PONV (postoperative nausea and vomiting)     Past Surgical History:  Procedure Laterality Date   ABDOMINAL HYSTERECTOMY     partial hysterectomy, ovaries remain   BACK SURGERY     CHOLECYSTECTOMY N/A 11/27/2019   Procedure: LAPAROSCOPIC CHOLECYSTECTOMY;  Surgeon: Virl Cagey, MD;  Location: [facility];  Service: General;  Laterality: N/A;   COLONOSCOPY N/A 02/29/2020   Procedure: COLONOSCOPY;  Surgeon: Rogene Houston, MD;  Location: [facility] SU[facility];  Service: Endoscopy;  Laterality: N/A;  8:45   ESOPHAGEAL DILATION N/A 11/17/2017   Procedure: ESOPHAGEAL DILATION;  Surgeon: Rogene Houston, MD;  Location: [facility];  Service: Endoscopy;  Laterality: N/A;   ESOPHAGOGASTRODUODENOSCOPY N/A 11/17/2017   Procedure: ESOPHAGOGASTRODUODENOSCOPY (EGD);  Surgeon: Rogene Houston, MD;  Location: [facility];  Service: Endoscopy;  Laterality: N/A;  12:00    ESOPHAGOGASTRODUODENOSCOPY N/A 11/02/2019   Procedure: ESOPHAGOGASTRODUODENOSCOPY (EGD);  Surgeon: Rogene Houston, MD;  Location: [facility];  Service: Endoscopy;  Laterality: N/A;  945   FOOT SURGERY     POLYPECTOMY  02/29/2020   Procedure: POLYPECTOMY;  Surgeon: Rogene Houston, MD;  Location: [facility];  Service: Endoscopy;;    There were no vitals filed for this visit.   Subjective Assessment - 09/23/20 1456     Subjective States that she had her hip replaced on Thursday 09/19/20. Reports it feels tight especially in the front. States she isn't sure if it is still bleeding. States she is using her walker around the home. Reports she is having swelling in her left leg. States that she has taken one pain medication the night of the surgery. Current pain level is 5/10 and described as tightness. States prior to the surgery she was having pain in her hip for about 2 months of severe pain. States she was completely independent prior to surgery. States she likes to bowl.    Patient Stated Goals to feel better    Currently in Pain? Yes    Pain Score 5     Pain Location Hip    Pain Orientation Anterior;Right    Pain Descriptors / Indicators Tightness    Pain Type Surgical pain    Aggravating Factors  movement    Pain Relieving Factors rest                [facility] PT Assessment - 09/23/20 0001       Assessment   Medical Diagnosis left anterior THR    Referring Provider (PT) Edmonia Lynch    Onset Date/Surgical Date 09/19/20    Next MD Visit 10/02/20    Prior Therapy at hospital      Precautions   Precautions Anterior Hip      Balance Screen   Has the patient fallen in the past 6 months No      [place] residence    Living Arrangements Alone    Available Help at Discharge Family   daughter and grand children staying there   Type of [place] to enter    Entrance Stairs-Number of Steps 5    Entrance  Stairs-Rails Right   going up in Walker and left going up in front door   [place] One level;Laundry or work area in Scientist, product/process development - 2 wheels      Prior Function   Level 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 All blood is contained and does not seem to be increasing in size. Will continue to monitor it. Swelling noted thoruhgout entire left leg    Focus on Therapeutic Outcomes (FOTO)  21.4% function      ROM / Strength   AROM / PROM / Strength AROM;Strength      AROM   AROM Assessment Site Knee;Hip    Right/Left Hip Left    Left Hip Flexion 100   pulling, pinching   Right/Left Knee Left    Left Knee Extension 15   lacking     Palpation   Palpation comment tenderness to palpation around incision and left thigh      Special Tests   Other special tests negative homan's sign Left      Transfers   Transfers Sit to Stand;Stand to Sit    Sit to Stand With upper extremity assist;6: Modified independent (Device/Increase time)    Stand to Sit 6: Modified independent (Device/Increase time);With upper extremity assist      Ambulation/Gait   Ambulation/Gait Yes    Ambulation Distance (Feet) 190 Feet    Assistive device Rolling walker    Gait Pattern Decreased hip/knee flexion - left;Decreased stance time - left;Trunk flexed;Trendelenburg;Antalgic    Ambulation Surface Level;Indoor    Gait Comments 2MW                           [facility] Adult PT Treatment/Exercise - 09/23/20 0001       Exercises   Exercises Knee/Hip      Knee/Hip Exercises: Supine   Heel Slides Left;10 reps   10 second holds   Other Supine Knee/Hip Exercises bent knee fall outs, x15 B                     PT Education - 09/23/20 1608     Education Details on  current presentation, on HEP, on elevation, massage for swelling and POC    Person(s) Educated  Patient    Methods Explanation    Comprehension Verbalized understanding              PT Short Term Goals - 09/23/20 1546       PT SHORT TERM GOAL #1   Title Patient will be able to walk at least 300 feet in 2 minutes wtihout assistive device to improve overall function    Time 4    Period Weeks    Status New    Target Date 10/07/20      PT SHORT TERM GOAL #2   Title Patient will report at least 50% improvement in overall symptoms and/or function to demonstrate improved functional mobility    Time 4    Period Weeks    Status New    Target Date 10/07/20      PT SHORT TERM GOAL #3   Title Patient will be independent in self management strategies to improve quality of life and functional outcomes.    Time 4    Period Weeks    Status New    Target Date 10/07/20               PT Long Term Goals - 09/23/20 1548       PT LONG TERM GOAL #1   Title Patient will be able to go up and down steps with reciprocal gait pattern and use of railing as needed to improve ability to go up her steps at home.    Time 8    Period Weeks    Status New    Target Date 11/18/20      PT LONG TERM GOAL #2   Title Patient will meet predicted FOTO score to demonstrate improved overall function.    Time 8    Period Weeks    Status New    Target Date 11/18/20      PT LONG TERM GOAL #3   Title Patient will report at least 75% improvement in overall symptoms and/or function to demonstrate improved functional mobility    Time 8    Period Weeks    Status New    Target Date 11/18/20                   Plan - 09/23/20 1554     Clinical Impression Statement Patient is a 75 y.o. female who presents to physical therapy with complaint of left hip pain after left THA on 09/19/20. Patient demonstrates decreased strength, ROM restriction, balance deficits and gait abnormalities which are likely  contributing to symptoms of pain and are negatively impacting patient ability to perform ADLs and functional mobility tasks. Patient will benefit from skilled physical therapy services to address these deficits to reduce pain, improve level of function with ADLs, functional mobility tasks, and reduce risk for falls.    Personal Factors and Comorbidities Comorbidity 1    Comorbidities hx of lumbar surgery    Examination-Activity Limitations Transfers;Lift;Locomotion Level;Stairs;Stand;Squat;Bed Mobility    Examination-Participation Restrictions Driving;Community Activity;Shop;Cleaning;Yard Work;Medication Management;Meal Prep    Stability/Clinical Decision Making Stable/Uncomplicated    Clinical Decision Making Low    Rehab Potential Good    PT Frequency 2x / week    PT Duration 8 weeks    PT Treatment/Interventions ADLs/Self Care Home Management;Aquatic Therapy;Cryotherapy;Moist Heat;Therapeutic exercise;Therapeutic activities;Stair training;Gait training;DME Instruction;Neuromuscular re-education;Patient/family education;Manual techniques;Dry needling;Joint Manipulations;Balance training    PT Next Visit Plan hip ROM, walking, swelling management, isometrics    PT Home Exercise Plan heel slides, bent knee fall  outs, walking, elevation    Consulted and Agree with Plan of Care Patient;Family member/caregiver    Family Member Consulted granddaughter - Dominica             Patient will benefit from skilled therapeutic intervention in order to improve the following deficits and impairments:  Pain, Decreased strength, Decreased activity tolerance, Decreased balance, Difficulty walking, Decreased range of motion, Postural dysfunction, Decreased mobility, Decreased knowledge of use of DME, Increased edema, Decreased scar mobility, Decreased skin integrity, Decreased endurance  Visit Diagnosis: Pain in left hip  Muscle weakness (generalized)  Difficulty in walking, not elsewhere  classified     Problem List Patient Active Problem List   Diagnosis Date Noted   Gallstones 11/17/2018   Gastroesophageal reflux disease without esophagitis 09/14/2017   Esophageal dysphagia 09/14/2017   Dyspnea 08/02/2013   GERD (gastroesophageal reflux disease) 08/02/2013   4:22 PM, 09/23/20 Jerene Pitch, DPT Physical Therapy with [facility]  [phone] office  [place] [address] [place], [place], [address] Phone: [phone]   Fax:  [phone]  Name: MUSKAN DOWN MRN: [id number] Date of Birth: 07/24/1945

## 2020-09-25 ENCOUNTER — Other Ambulatory Visit: Payer: Self-pay

## 2020-09-25 ENCOUNTER — Ambulatory Visit (HOSPITAL_COMMUNITY): Payer: Medicare Other

## 2020-09-25 DIAGNOSIS — R262 Difficulty in walking, not elsewhere classified: Secondary | ICD-10-CM | POA: Diagnosis not present

## 2020-09-25 DIAGNOSIS — M6281 Muscle weakness (generalized): Secondary | ICD-10-CM

## 2020-09-25 DIAGNOSIS — R29898 Other symptoms and signs involving the musculoskeletal system: Secondary | ICD-10-CM | POA: Diagnosis not present

## 2020-09-25 DIAGNOSIS — M25552 Pain in left hip: Secondary | ICD-10-CM

## 2020-09-25 NOTE — Therapy (Signed)
[place], [place], [address] Phone: [phone]   Fax:  [phone]  Physical Therapy Treatment  Patient Details  Name: Laurie Bridges MRN: [id number] Date of Birth: 1945-03-12 Referring Provider (PT): Edmonia Lynch   Encounter Date: 09/25/2020   PT End of Session - 09/25/20 1349     Visit Number 2    Number of Visits 16    Date for PT Re-Evaluation 11/18/20    Authorization Type BCBS no auth or VL    Progress Note Due on Visit 10    PT Start Time 1345    PT Stop Time 1430    PT Time Calculation (min) 45 min    Activity Tolerance Patient tolerated treatment well    Behavior During Therapy [facility] for tasks assessed/performed             Past Medical History:  Diagnosis Date   Anemia    hx of Fe def many years ago   Anxiety    Arthritis    back   CAD (coronary artery disease)    a. s/p Coronary CT in 06/2018 showing mild stenosis along the mid-LAD, mid-LCx and mid-RCA   GERD (gastroesophageal reflux disease)    Hypertension    PONV (postoperative nausea and vomiting)     Past Surgical History:  Procedure Laterality Date   ABDOMINAL HYSTERECTOMY     partial hysterectomy, ovaries remain   BACK SURGERY     CHOLECYSTECTOMY N/A 11/27/2019   Procedure: LAPAROSCOPIC CHOLECYSTECTOMY;  Surgeon: Virl Cagey, MD;  Location: [facility];  Service: General;  Laterality: N/A;   COLONOSCOPY N/A 02/29/2020   Procedure: COLONOSCOPY;  Surgeon: Rogene Houston, MD;  [facility];  Service: Endoscopy;  Laterality: N/A;  8:45   ESOPHAGEAL DILATION N/A 11/17/2017   Procedure: ESOPHAGEAL DILATION;  Surgeon: Rogene Houston, MD;  [facility];  Service: Endoscopy;  Laterality: N/A;   ESOPHAGOGASTRODUODENOSCOPY N/A 11/17/2017   Procedure: ESOPHAGOGASTRODUODENOSCOPY (EGD);  Surgeon: Rogene Houston, MD;  [facility];  Service: Endoscopy;  Laterality: N/A;  12:00    ESOPHAGOGASTRODUODENOSCOPY N/A 11/02/2019   Procedure: ESOPHAGOGASTRODUODENOSCOPY (EGD);  Surgeon: Rogene Houston, MD;  [facility];  Service: Endoscopy;  Laterality: N/A;  945   FOOT SURGERY     POLYPECTOMY  02/29/2020   Procedure: POLYPECTOMY;  Surgeon: Rogene Houston, MD;  [facility];  Service: Endoscopy;;    There were no vitals filed for this visit.   Subjective Assessment - 09/25/20 1353     Subjective NOt feeling too much pain, mainly tightness along left lateral buttocks    Patient Stated Goals to feel better    Currently in Pain? Yes    Pain Score 5     Pain Location Hip    Pain Orientation Left    Pain Descriptors / Indicators Tightness    Pain Type Surgical pain                               [facility] Adult PT Treatment/Exercise - 09/25/20 0001       Knee/Hip Exercises: Standing   Other Standing Knee Exercises sidestepping x 2 min    Other Standing Knee Exercises forwards-backwards walking x 2 min      Knee/Hip Exercises: Supine   Quad Sets Strengthening;Left;2 sets;10 reps    Short Arc Quad Sets Strengthening;Left;2 sets;10  reps    Heel Slides AAROM;Left;15 reps    Hip Adduction Isometric Strengthening;Both;2 sets;10 reps    Hip Adduction Isometric Limitations ball squeeze    Other Supine Knee/Hip Exercises bent knee fall outs, x15 B                     PT Education - 09/25/20 1402     Education Details demonstration and practice in use of sock aid    Person(s) Educated Patient    Methods Explanation    Comprehension Verbalized understanding              PT Short Term Goals - 09/23/20 1546       PT SHORT TERM GOAL #1   Title Patient will be able to walk at least 300 feet in 2 minutes wtihout assistive device to improve overall function    Time 4    Period Weeks    Status New    Target Date 10/07/20      PT SHORT TERM GOAL #2   Title Patient will report at least 50% improvement in overall  symptoms and/or function to demonstrate improved functional mobility    Time 4    Period Weeks    Status New    Target Date 10/07/20      PT SHORT TERM GOAL #3   Title Patient will be independent in self management strategies to improve quality of life and functional outcomes.    Time 4    Period Weeks    Status New    Target Date 10/07/20               PT Long Term Goals - 09/23/20 1548       PT LONG TERM GOAL #1   Title Patient will be able to go up and down steps with reciprocal gait pattern and use of railing as needed to improve ability to go up her steps at home.    Time 8    Period Weeks    Status New    Target Date 11/18/20      PT LONG TERM GOAL #2   Title Patient will meet predicted FOTO score to demonstrate improved overall function.    Time 8    Period Weeks    Status New    Target Date 11/18/20      PT LONG TERM GOAL #3   Title Patient will report at least 75% improvement in overall symptoms and/or function to demonstrate improved functional mobility    Time 8    Period Weeks    Status New    Target Date 11/18/20                   Plan - 09/25/20 1430     Clinical Impression Statement Tolerating tx sessions well without adverse effects and able to increase ther ex regimen at today's session to add for HEP.  Demonstrates some antalgia and Trendelenberg LLE in stance. Continued sessions indicated to improve LLE strength, ROM, reduce pain, and progress to normalized ambulation without AD    Personal Factors and Comorbidities Comorbidity 1    Comorbidities hx of lumbar surgery    Examination-Activity Limitations Transfers;Lift;Locomotion Level;Stairs;Stand;Squat;Bed Mobility    Examination-Participation Restrictions Driving;Community Activity;Shop;Cleaning;Yard Work;Medication Management;Meal Prep    Stability/Clinical Decision Making Stable/Uncomplicated    Rehab Potential Good    PT Frequency 2x / week    PT Duration 8 weeks    PT  Treatment/Interventions ADLs/Self  Care Home Management;Aquatic Therapy;Cryotherapy;Moist Heat;Therapeutic exercise;Therapeutic activities;Stair training;Gait training;DME Instruction;Neuromuscular re-education;Patient/family education;Manual techniques;Dry needling;Joint Manipulations;Balance training    PT Next Visit Plan hip ROM, walking, swelling management, isometrics    PT Home Exercise Plan heel slides, bent knee fall outs, walking, elevation    Consulted and Agree with Plan of Care Patient;Family member/caregiver    Family Member Consulted granddaughter - Dominica             Patient will benefit from skilled therapeutic intervention in order to improve the following deficits and impairments:  Pain, Decreased strength, Decreased activity tolerance, Decreased balance, Difficulty walking, Decreased range of motion, Postural dysfunction, Decreased mobility, Decreased knowledge of use of DME, Increased edema, Decreased scar mobility, Decreased skin integrity, Decreased endurance  Visit Diagnosis: Pain in left hip  Muscle weakness (generalized)  Difficulty in walking, not elsewhere classified  Other symptoms and signs involving the musculoskeletal system     Problem List Patient Active Problem List   Diagnosis Date Noted   Gallstones 11/17/2018   Gastroesophageal reflux disease without esophagitis 09/14/2017   Esophageal dysphagia 09/14/2017   Dyspnea 08/02/2013   GERD (gastroesophageal reflux disease) 08/02/2013    Toniann Fail, PT 09/25/2020, 2:32 PM  [facility] [address] [place], [place], [address] Phone: [phone]   Fax:  [phone]  Name: Laurie Bridges MRN: [id number] Date of Birth: 1945-07-07

## 2020-09-25 NOTE — Patient Instructions (Signed)
Access Code: [email or web address] URL: [email or web address] Date: 09/25/2020 Prepared by: Sherlyn Lees  Exercises Supine Hip Adduction Isometric with Diona Foley - 1 x daily - 7 x weekly - 3 sets - 10 reps Side Stepping with Counter Support - 2-3 x daily - 7 x weekly Backward Walking with Counter Support - 1 x daily - 7 x weekly

## 2020-10-01 ENCOUNTER — Ambulatory Visit (HOSPITAL_COMMUNITY): Payer: Medicare Other | Admitting: Physical Therapy

## 2020-10-01 ENCOUNTER — Other Ambulatory Visit: Payer: Self-pay

## 2020-10-01 DIAGNOSIS — R29898 Other symptoms and signs involving the musculoskeletal system: Secondary | ICD-10-CM

## 2020-10-01 DIAGNOSIS — M25552 Pain in left hip: Secondary | ICD-10-CM

## 2020-10-01 DIAGNOSIS — M6281 Muscle weakness (generalized): Secondary | ICD-10-CM

## 2020-10-01 DIAGNOSIS — R262 Difficulty in walking, not elsewhere classified: Secondary | ICD-10-CM | POA: Diagnosis not present

## 2020-10-01 NOTE — Therapy (Signed)
[place], [place], [address] Phone: [phone]   Fax:  [phone]  Physical Therapy Treatment  Patient Details  Name: Laurie Bridges MRN: [id number] Date of Birth: Jan 06, 1946 Referring Provider (PT): Edmonia Lynch   Encounter Date: 10/01/2020   PT End of Session - 10/01/20 1611     Visit Number 3    Number of Visits 16    Date for PT Re-Evaluation 11/18/20    Authorization Type BCBS no auth or VL    Progress Note Due on Visit 10    PT Start Time 1532    PT Stop Time 1611    PT Time Calculation (min) 39 min    Activity Tolerance Patient tolerated treatment well    Behavior During Therapy [facility] for tasks assessed/performed             Past Medical History:  Diagnosis Date   Anemia    hx of Fe def many years ago   Anxiety    Arthritis    back   CAD (coronary artery disease)    a. s/p Coronary CT in 06/2018 showing mild stenosis along the mid-LAD, mid-LCx and mid-RCA   GERD (gastroesophageal reflux disease)    Hypertension    PONV (postoperative nausea and vomiting)     Past Surgical History:  Procedure Laterality Date   ABDOMINAL HYSTERECTOMY     partial hysterectomy, ovaries remain   BACK SURGERY     CHOLECYSTECTOMY N/A 11/27/2019   Procedure: LAPAROSCOPIC CHOLECYSTECTOMY;  Surgeon: Virl Cagey, MD;  [facility]: [facility];  Service: General;  Laterality: N/A;   COLONOSCOPY N/A 02/29/2020   Procedure: COLONOSCOPY;  Surgeon: Rogene Houston, MD;  [facility];  Service: Endoscopy;  Laterality: N/A;  8:45   ESOPHAGEAL DILATION N/A 11/17/2017   Procedure: ESOPHAGEAL DILATION;  Surgeon: Rogene Houston, MD;  [facility];  Service: Endoscopy;  Laterality: N/A;   ESOPHAGOGASTRODUODENOSCOPY N/A 11/17/2017   Procedure: ESOPHAGOGASTRODUODENOSCOPY (EGD);  Surgeon: Rogene Houston, MD;  [facility];  Service: Endoscopy;  Laterality: N/A;  12:00    ESOPHAGOGASTRODUODENOSCOPY N/A 11/02/2019   Procedure: ESOPHAGOGASTRODUODENOSCOPY (EGD);  Surgeon: Rogene Houston, MD;  [facility];  Service: Endoscopy;  Laterality: N/A;  945   FOOT SURGERY     POLYPECTOMY  02/29/2020   Procedure: POLYPECTOMY;  Surgeon: Rogene Houston, MD;  [facility];  Service: Endoscopy;;    There were no vitals filed for this visit.   Subjective Assessment - 10/01/20 1535     Subjective Pt reports no pain really at rest but increases to about at 5/10 with activity/ambulation.    Currently in Pain? Yes    Pain Score 5     Pain Location Hip    Pain Orientation Left    Pain Descriptors / Indicators Tightness;Aching    Pain Type Surgical pain                               [facility] Adult PT Treatment/Exercise - 10/01/20 0001       Knee/Hip Exercises: Standing   Heel Raises 10 reps    Heel Raises Limitations toerasies 10 reps    Knee Flexion Left;10 reps    Hip Abduction Left;10 reps    Gait Training no AD 226 feet cues for heel-toe equal stride      Knee/Hip Exercises: Seated  Sit to Sand 10 reps;without UE support   slightly elevated surface to encourage equal foot placement.     Knee/Hip Exercises: Supine   Short Arc Quad Sets Strengthening;Left;2 sets;10 reps    Heel Slides AAROM;Left;15 reps    Hip Adduction Isometric Strengthening;Both;2 sets;10 reps    Hip Adduction Isometric Limitations ball squeeze    Bridges 2 sets;5 reps    Straight Leg Raises Left;2 sets;5 reps    Straight Leg Raises Limitations with Rt knee bent    Other Supine Knee/Hip Exercises bent knee fall outs, x15 B                       PT Short Term Goals - 09/23/20 1546       PT SHORT TERM GOAL #1   Title Patient will be able to walk at least 300 feet in 2 minutes wtihout assistive device to improve overall function    Time 4    Period Weeks    Status New    Target Date 10/07/20      PT SHORT TERM GOAL #2   Title  Patient will report at least 50% improvement in overall symptoms and/or function to demonstrate improved functional mobility    Time 4    Period Weeks    Status New    Target Date 10/07/20      PT SHORT TERM GOAL #3   Title Patient will be independent in self management strategies to improve quality of life and functional outcomes.    Time 4    Period Weeks    Status New    Target Date 10/07/20               PT Long Term Goals - 09/23/20 1548       PT LONG TERM GOAL #1   Title Patient will be able to go up and down steps with reciprocal gait pattern and use of railing as needed to improve ability to go up her steps at home.    Time 8    Period Weeks    Status New    Target Date 11/18/20      PT LONG TERM GOAL #2   Title Patient will meet predicted FOTO score to demonstrate improved overall function.    Time 8    Period Weeks    Status New    Target Date 11/18/20      PT LONG TERM GOAL #3   Title Patient will report at least 75% improvement in overall symptoms and/or function to demonstrate improved functional mobility    Time 8    Period Weeks    Status New    Target Date 11/18/20                   Plan - 10/01/20 1612     Clinical Impression Statement Pt eager to begin walking without AD today.  Able to complete full revolution around clinic without AD with slight analgia noted.  Progressed with bridge for glute strengthening and SLR for quad/hip flexor strengthening.  Pt able to complete both of these without any complaints of pain.  Noted inability to flatten Lt LE down on mat due to hip flexor tightness (probably from anterior scar).  encouraged to stretch this out onto bed to tolerance and gently rub area to prevent scar tissue.  Explained once dressing removed tomorrow at surgeon's office will loosen up.    Progressed to standing heel/toe raises,  hamstring curls and hip abduction all with cues for form and posture. No pain reported.    Personal Factors  and Comorbidities Comorbidity 1    Comorbidities hx of lumbar surgery    Examination-Activity Limitations Transfers;Lift;Locomotion Level;Stairs;Stand;Squat;Bed Mobility    Examination-Participation Restrictions Driving;Community Activity;Shop;Cleaning;Yard Work;Medication Management;Meal Prep    Stability/Clinical Decision Making Stable/Uncomplicated    Rehab Potential Good    PT Frequency 2x / week    PT Duration 8 weeks    PT Treatment/Interventions ADLs/Self Care Home Management;Aquatic Therapy;Cryotherapy;Moist Heat;Therapeutic exercise;Therapeutic activities;Stair training;Gait training;DME Instruction;Neuromuscular re-education;Patient/family education;Manual techniques;Dry needling;Joint Manipulations;Balance training    PT Next Visit Plan progress standing; functional strengthening while avoiding hyperextension; F/U on appointment with MD next visit.    PT Home Exercise Plan heel slides, bent knee fall outs, walking, elevation    Consulted and Agree with Plan of Care Patient;Family member/caregiver    Family Member Consulted granddaughter - Dominica             Patient will benefit from skilled therapeutic intervention in order to improve the following deficits and impairments:  Pain, Decreased strength, Decreased activity tolerance, Decreased balance, Difficulty walking, Decreased range of motion, Postural dysfunction, Decreased mobility, Decreased knowledge of use of DME, Increased edema, Decreased scar mobility, Decreased skin integrity, Decreased endurance  Visit Diagnosis: Pain in left hip  Muscle weakness (generalized)  Difficulty in walking, not elsewhere classified  Other symptoms and signs involving the musculoskeletal system     Problem List Patient Active Problem List   Diagnosis Date Noted   Gallstones 11/17/2018   Gastroesophageal reflux disease without esophagitis 09/14/2017   Esophageal dysphagia 09/14/2017   Dyspnea 08/02/2013   GERD (gastroesophageal  reflux disease) 08/02/2013   Teena Irani, PTA/CLT [phone]  Teena Irani, PTA 10/01/2020, 4:14 PM  [facility] [address] [place], [place], [address] Phone: [phone]   Fax:  [phone]  Name: LAKENDRIA NICASTRO MRN: [id number] Date of Birth: 02-23-1945

## 2020-10-02 DIAGNOSIS — M1612 Unilateral primary osteoarthritis, left hip: Secondary | ICD-10-CM | POA: Diagnosis not present

## 2020-10-03 ENCOUNTER — Other Ambulatory Visit: Payer: Self-pay

## 2020-10-03 ENCOUNTER — Encounter (HOSPITAL_COMMUNITY): Payer: Self-pay

## 2020-10-03 ENCOUNTER — Ambulatory Visit (HOSPITAL_COMMUNITY): Payer: Medicare Other

## 2020-10-03 DIAGNOSIS — M6281 Muscle weakness (generalized): Secondary | ICD-10-CM

## 2020-10-03 DIAGNOSIS — R262 Difficulty in walking, not elsewhere classified: Secondary | ICD-10-CM | POA: Diagnosis not present

## 2020-10-03 DIAGNOSIS — R29898 Other symptoms and signs involving the musculoskeletal system: Secondary | ICD-10-CM | POA: Diagnosis not present

## 2020-10-03 DIAGNOSIS — M25552 Pain in left hip: Secondary | ICD-10-CM | POA: Diagnosis not present

## 2020-10-03 NOTE — Therapy (Signed)
[place], [place], [address] Phone: [phone]   Fax:  [phone]  Physical Therapy Treatment  Patient Details  Name: Laurie Bridges MRN: [id number] Date of Birth: 1945-11-07 Referring Provider (PT): Edmonia Lynch   Encounter Date: 10/03/2020   PT End of Session - 10/03/20 1256     Visit Number 4    Number of Visits 16    Date for PT Re-Evaluation 11/18/20    Authorization Type BCBS no auth or VL    Progress Note Due on Visit 10    PT Start Time 8242    PT Stop Time 1340    PT Time Calculation (min) 41 min    Activity Tolerance Patient tolerated treatment well;No increased pain    Behavior During Therapy [facility] for tasks assessed/performed             Past Medical History:  Diagnosis Date   Anemia    hx of Fe def many years ago   Anxiety    Arthritis    back   CAD (coronary artery disease)    a. s/p Coronary CT in 06/2018 showing mild stenosis along the mid-LAD, mid-LCx and mid-RCA   GERD (gastroesophageal reflux disease)    Hypertension    PONV (postoperative nausea and vomiting)     Past Surgical History:  Procedure Laterality Date   ABDOMINAL HYSTERECTOMY     partial hysterectomy, ovaries remain   BACK SURGERY     CHOLECYSTECTOMY N/A 11/27/2019   Procedure: LAPAROSCOPIC CHOLECYSTECTOMY;  Surgeon: Virl Cagey, MD;  [facility];  Service: General;  Laterality: N/A;   COLONOSCOPY N/A 02/29/2020   Procedure: COLONOSCOPY;  Surgeon: Rogene Houston, MD;  [facility];  Service: Endoscopy;  Laterality: N/A;  8:45   ESOPHAGEAL DILATION N/A 11/17/2017   Procedure: ESOPHAGEAL DILATION;  Surgeon: Rogene Houston, MD;  [facility];  Service: Endoscopy;  Laterality: N/A;   ESOPHAGOGASTRODUODENOSCOPY N/A 11/17/2017   Procedure: ESOPHAGOGASTRODUODENOSCOPY (EGD);  Surgeon: Rogene Houston, MD;  [facility];  Service: Endoscopy;  Laterality: N/A;  12:00    ESOPHAGOGASTRODUODENOSCOPY N/A 11/02/2019   Procedure: ESOPHAGOGASTRODUODENOSCOPY (EGD);  Surgeon: Rogene Houston, MD;  [facility];  Service: Endoscopy;  Laterality: N/A;  945   FOOT SURGERY     POLYPECTOMY  02/29/2020   Procedure: POLYPECTOMY;  Surgeon: Rogene Houston, MD;  [facility];  Service: Endoscopy;;    There were no vitals filed for this visit.   Subjective Assessment - 10/03/20 1302     Subjective Pt reports f/u yesterday and was cleared to drive, go up/down basement stairs, no longer has to wear compression stockings.    Currently in Pain? Yes    Pain Score 4     Pain Location Hip    Pain Orientation Left    Pain Descriptors / Indicators Tightness;Aching    Pain Type Surgical pain                  [facility] Adult PT Treatment/Exercise - 10/03/20 0001       Ambulation/Gait   Ambulation/Gait Yes    Gait Comments gait towards mirror to encourage equal bil step length and weight-shifting L; gait walking at normal, slow and fast cadence to encourage equal bil step lenght and weight-shift L      Knee/Hip Exercises: Stretches   Hip Flexor Stretch Both;2 reps;30 seconds    Hip Flexor  Stretch Limitations split stance to stretch, upright standing posture, no pain      Knee/Hip Exercises: Standing   Other Standing Knee Exercises SLS 20 sec with single HHA      Knee/Hip Exercises: Seated   Sit to [employer] 10 reps   mirror feedback, VCs for wider BOS/avoiding genu valgum     Knee/Hip Exercises: Supine   Quad Sets 15 reps    Bridges 10 reps    Bridges with Clamshell 10 reps   gait belt around thighs   Straight Leg Raises Left;5 reps;3 sets    Straight Leg Raises Limitations R knee bent                     PT Education - 10/03/20 1255     Education Details Exercise technique, continue HEP, rest and ice, anterior hip precautions vs posterior hip precautions    Person(s) Educated Patient    Methods Explanation    Comprehension  Verbalized understanding              PT Short Term Goals - 10/03/20 1256       PT SHORT TERM GOAL #1   Title Patient will be able to walk at least 300 feet in 2 minutes wtihout assistive device to improve overall function    Time 4    Period Weeks    Status On-going    Target Date 10/07/20      PT SHORT TERM GOAL #2   Title Patient will report at least 50% improvement in overall symptoms and/or function to demonstrate improved functional mobility    Time 4    Period Weeks    Status On-going    Target Date 10/07/20      PT SHORT TERM GOAL #3   Title Patient will be independent in self management strategies to improve quality of life and functional outcomes.    Time 4    Period Weeks    Status On-going    Target Date 10/07/20               PT Long Term Goals - 10/03/20 1256       PT LONG TERM GOAL #1   Title Patient will be able to go up and down steps with reciprocal gait pattern and use of railing as needed to improve ability to go up her steps at home.    Time 8    Period Weeks    Status On-going      PT LONG TERM GOAL #2   Title Patient will meet predicted FOTO score to demonstrate improved overall function.    Time 8    Period Weeks    Status On-going      PT LONG TERM GOAL #3   Title Patient will report at least 75% improvement in overall symptoms and/or function to demonstrate improved functional mobility    Time 8    Period Weeks    Status On-going                   Plan - 10/03/20 1322     Clinical Impression Statement Pt fatigues with SLR, only able to perform 5 reps before requiring rest break. Gait training with mirror feedback and speed changes to encourage equal bil step length and weight shift L. Pt with noticeable L hip flexor tightness, verbalizes gentle stretch with split stance that is tolerable. Continued LE strengthening and added SLS for balance and proprioception training with mild  trunk sway improved with single HHA.  Educated on HEP and walking program at home to progress as able, reviewed ice and elevation to improve swelling and pain. Continue to progress as able.    Personal Factors and Comorbidities Comorbidity 1    Comorbidities hx of lumbar surgery    Examination-Activity Limitations Transfers;Lift;Locomotion Level;Stairs;Stand;Squat;Bed Mobility    Examination-Participation Restrictions Driving;Community Activity;Shop;Cleaning;Yard Work;Medication Management;Meal Prep    Stability/Clinical Decision Making Stable/Uncomplicated    Rehab Potential Good    PT Frequency 2x / week    PT Duration 8 weeks    PT Treatment/Interventions ADLs/Self Care Home Management;Aquatic Therapy;Cryotherapy;Moist Heat;Therapeutic exercise;Therapeutic activities;Stair training;Gait training;DME Instruction;Neuromuscular re-education;Patient/family education;Manual techniques;Dry needling;Joint Manipulations;Balance training    PT Next Visit Plan progress standing, equal bil step length; functional strengthening while avoiding hyperextension    PT Home Exercise Plan heel slides, bent knee fall outs, walking, elevation    Consulted and Agree with Plan of Care Patient;Family member/caregiver    Family Member Consulted granddaughter - Dominica             Patient will benefit from skilled therapeutic intervention in order to improve the following deficits and impairments:  Pain, Decreased strength, Decreased activity tolerance, Decreased balance, Difficulty walking, Decreased range of motion, Postural dysfunction, Decreased mobility, Decreased knowledge of use of DME, Increased edema, Decreased scar mobility, Decreased skin integrity, Decreased endurance  Visit Diagnosis: Pain in left hip  Muscle weakness (generalized)  Difficulty in walking, not elsewhere classified     Problem List Patient Active Problem List   Diagnosis Date Noted   Gallstones 11/17/2018   Gastroesophageal reflux disease without esophagitis  09/14/2017   Esophageal dysphagia 09/14/2017   Dyspnea 08/02/2013   GERD (gastroesophageal reflux disease) 08/02/2013     Talbot Grumbling PT, DPT 10/03/20, 1:47 PM    [facility] [address] [place], [place], [address] Phone: [phone]   Fax:  [phone]  Name: Laurie Bridges MRN: [id number] Date of Birth: 04/17/1945

## 2020-10-08 ENCOUNTER — Ambulatory Visit (HOSPITAL_COMMUNITY): Payer: Medicare Other | Admitting: Physical Therapy

## 2020-10-08 ENCOUNTER — Other Ambulatory Visit: Payer: Self-pay

## 2020-10-08 DIAGNOSIS — M25552 Pain in left hip: Secondary | ICD-10-CM | POA: Diagnosis not present

## 2020-10-08 DIAGNOSIS — R29898 Other symptoms and signs involving the musculoskeletal system: Secondary | ICD-10-CM | POA: Diagnosis not present

## 2020-10-08 DIAGNOSIS — M6281 Muscle weakness (generalized): Secondary | ICD-10-CM

## 2020-10-08 DIAGNOSIS — R262 Difficulty in walking, not elsewhere classified: Secondary | ICD-10-CM

## 2020-10-08 NOTE — Therapy (Signed)
[place], [place], [address] Phone: [phone]   Fax:  [phone]  Physical Therapy Treatment  Patient Details  Name: Laurie Bridges MRN: [id number] Date of Birth: Jun 14, 1945 Referring Provider (PT): Edmonia Lynch   Encounter Date: 10/08/2020   PT End of Session - 10/08/20 1543     Visit Number 5    Number of Visits 16    Date for PT Re-Evaluation 11/18/20    Authorization Type BCBS no auth or VL    Progress Note Due on Visit 10    PT Start Time 1320    PT Stop Time 1400    PT Time Calculation (min) 40 min    Activity Tolerance Patient tolerated treatment well;No increased pain    Behavior During Therapy WFL for tasks assessed/performed             Past Medical History:  Diagnosis Date   Anemia    hx of Fe def many years ago   Anxiety    Arthritis    back   CAD (coronary artery disease)    a. s/p Coronary CT in 06/2018 showing mild stenosis along the mid-LAD, mid-LCx and mid-RCA   GERD (gastroesophageal reflux disease)    Hypertension    PONV (postoperative nausea and vomiting)     Past Surgical History:  Procedure Laterality Date   ABDOMINAL HYSTERECTOMY     partial hysterectomy, ovaries remain   BACK SURGERY     CHOLECYSTECTOMY N/A 11/27/2019   Procedure: LAPAROSCOPIC CHOLECYSTECTOMY;  Surgeon: Virl Cagey, MD;  Location: [facility];  Service: General;  Laterality: N/A;   COLONOSCOPY N/A 02/29/2020   Procedure: COLONOSCOPY;  Surgeon: Rogene Houston, MD;  [facility];  Service: Endoscopy;  Laterality: N/A;  8:45   ESOPHAGEAL DILATION N/A 11/17/2017   Procedure: ESOPHAGEAL DILATION;  Surgeon: Rogene Houston, MD;  [facility];  Service: Endoscopy;  Laterality: N/A;   ESOPHAGOGASTRODUODENOSCOPY N/A 11/17/2017   Procedure: ESOPHAGOGASTRODUODENOSCOPY (EGD);  Surgeon: Rogene Houston, MD;  [facility];  Service: Endoscopy;  Laterality: N/A;  12:00    ESOPHAGOGASTRODUODENOSCOPY N/A 11/02/2019   Procedure: ESOPHAGOGASTRODUODENOSCOPY (EGD);  Surgeon: Rogene Houston, MD;  [facility];  Service: Endoscopy;  Laterality: N/A;  945   FOOT SURGERY     POLYPECTOMY  02/29/2020   Procedure: POLYPECTOMY;  Surgeon: Rogene Houston, MD;  [facility];  Service: Endoscopy;;    There were no vitals filed for this visit.   Subjective Assessment - 10/08/20 1541     Subjective Pt states she's been walking up and down her driveway and is a little sore in her Lt hip today.  2/10 pain.  States her largest limiting factor is the tightness in her Lt hip that is causing pain and immobility.                               [facility] Adult PT Treatment/Exercise - 10/08/20 0001       Knee/Hip Exercises: Standing   Heel Raises 10 reps    Knee Flexion Left;10 reps    Hip Abduction Left;10 reps    Lateral Step Up Left;10 reps;Hand Hold: 1;Step Height: 4"    Forward Step Up Left;Hand Hold: 1;Step Height: 4";10 reps    Step Down Left;10 reps;Hand Hold: 1;Step Height: 4"    SLS with Vectors 5X5" each  LE with 1HHA    Other Standing Knee Exercises SLS 20 sec with single HHA    Other Standing Knee Exercises tandem stance 30" each LE lead X 2 each LE      Manual Therapy   Manual Therapy Soft tissue mobilization    Manual therapy comments completed seperately from all other skilled interventions    Soft tissue mobilization scar massage/release anterior thigh and scar                     PT Education - 10/08/20 1543     Education Details scar massage; completing her basement stairs with step to pattern until practiced here in clinic for reciprocally.    Person(s) Educated Patient    Methods Explanation    Comprehension Verbalized understanding              PT Short Term Goals - 10/03/20 1256       PT SHORT TERM GOAL #1   Title Patient will be able to walk at least 300 feet in 2 minutes wtihout  assistive device to improve overall function    Time 4    Period Weeks    Status On-going    Target Date 10/07/20      PT SHORT TERM GOAL #2   Title Patient will report at least 50% improvement in overall symptoms and/or function to demonstrate improved functional mobility    Time 4    Period Weeks    Status On-going    Target Date 10/07/20      PT SHORT TERM GOAL #3   Title Patient will be independent in self management strategies to improve quality of life and functional outcomes.    Time 4    Period Weeks    Status On-going    Target Date 10/07/20               PT Long Term Goals - 10/03/20 1256       PT LONG TERM GOAL #1   Title Patient will be able to go up and down steps with reciprocal gait pattern and use of railing as needed to improve ability to go up her steps at home.    Time 8    Period Weeks    Status On-going      PT LONG TERM GOAL #2   Title Patient will meet predicted FOTO score to demonstrate improved overall function.    Time 8    Period Weeks    Status On-going      PT LONG TERM GOAL #3   Title Patient will report at least 75% improvement in overall symptoms and/or function to demonstrate improved functional mobility    Time 8    Period Weeks    Status On-going                   Plan - 10/08/20 1540     Clinical Impression Statement Continued with established strengthening exercises.  Added repeated step ups to work on strengthening of LE's and vectors to improve glute strength and overall stability.  Pt still struggles with maintaining static balance on either LE, however Rt tends to be more difficult than Lt.  Instructed with scar massage and completed to anterior hip.  Extreme tightness and adhesion of scar noted and most likely contributing to patients' discomfort.  PT reported overall improvement following.  Encouraged to complete this at home and suggested a rolling pin or paint roller to use  as well.    Personal Factors and  Comorbidities Comorbidity 1    Comorbidities hx of lumbar surgery    Examination-Activity Limitations Transfers;Lift;Locomotion Level;Stairs;Stand;Squat;Bed Mobility    Examination-Participation Restrictions Driving;Community Activity;Shop;Cleaning;Yard Work;Medication Management;Meal Prep    Stability/Clinical Decision Making Stable/Uncomplicated    Rehab Potential Good    PT Frequency 2x / week    PT Duration 8 weeks    PT Treatment/Interventions ADLs/Self Care Home Management;Aquatic Therapy;Cryotherapy;Moist Heat;Therapeutic exercise;Therapeutic activities;Stair training;Gait training;DME Instruction;Neuromuscular re-education;Patient/family education;Manual techniques;Dry needling;Joint Manipulations;Balance training    PT Next Visit Plan progress standing, equal bil step length; functional strengthening while avoiding hyperextension.  Continue with scar massage to reduce pain/discomfort.    PT Home Exercise Plan heel slides, bent knee fall outs, walking, elevation    Consulted and Agree with Plan of Care Patient;Family member/caregiver    Family Member Consulted granddaughter - Dominica             Patient will benefit from skilled therapeutic intervention in order to improve the following deficits and impairments:  Pain, Decreased strength, Decreased activity tolerance, Decreased balance, Difficulty walking, Decreased range of motion, Postural dysfunction, Decreased mobility, Decreased knowledge of use of DME, Increased edema, Decreased scar mobility, Decreased skin integrity, Decreased endurance  Visit Diagnosis: Pain in left hip  Difficulty in walking, not elsewhere classified  Muscle weakness (generalized)     Problem List Patient Active Problem List   Diagnosis Date Noted   Gallstones 11/17/2018   Gastroesophageal reflux disease without esophagitis 09/14/2017   Esophageal dysphagia 09/14/2017   Dyspnea 08/02/2013   GERD (gastroesophageal reflux disease) 08/02/2013    Teena Irani, PTA/CLT [phone]  Teena Irani, PTA 10/08/2020, 3:45 PM  [facility] [address] [place], [place], [address] Phone: [phone]   Fax:  [phone]  Name: Laurie Bridges MRN: [id number] Date of Birth: 27-Dec-1945

## 2020-10-10 ENCOUNTER — Other Ambulatory Visit: Payer: Self-pay

## 2020-10-10 ENCOUNTER — Ambulatory Visit (HOSPITAL_COMMUNITY): Payer: Medicare Other | Admitting: Physical Therapy

## 2020-10-10 DIAGNOSIS — R262 Difficulty in walking, not elsewhere classified: Secondary | ICD-10-CM | POA: Diagnosis not present

## 2020-10-10 DIAGNOSIS — M6281 Muscle weakness (generalized): Secondary | ICD-10-CM

## 2020-10-10 DIAGNOSIS — M25552 Pain in left hip: Secondary | ICD-10-CM | POA: Diagnosis not present

## 2020-10-10 DIAGNOSIS — R29898 Other symptoms and signs involving the musculoskeletal system: Secondary | ICD-10-CM

## 2020-10-10 NOTE — Therapy (Signed)
[place], [place], [address] Phone: [phone]   Fax:  [phone]  Physical Therapy Treatment  Patient Details  Name: Laurie Bridges MRN: [id number] Date of Birth: 1945-10-23 Referring Provider (PT): Edmonia Lynch   Encounter Date: 10/10/2020   PT End of Session - 10/10/20 1429     Visit Number 6    Number of Visits 16    Date for PT Re-Evaluation 11/18/20    Authorization Type BCBS no auth or VL    Progress Note Due on Visit 10    PT Start Time 1318    PT Stop Time 1400    PT Time Calculation (min) 42 min    Activity Tolerance Patient tolerated treatment well;No increased pain    Behavior During Therapy WFL for tasks assessed/performed             Past Medical History:  Diagnosis Date   Anemia    hx of Fe def many years ago   Anxiety    Arthritis    back   CAD (coronary artery disease)    a. s/p Coronary CT in 06/2018 showing mild stenosis along the mid-LAD, mid-LCx and mid-RCA   GERD (gastroesophageal reflux disease)    Hypertension    PONV (postoperative nausea and vomiting)     Past Surgical History:  Procedure Laterality Date   ABDOMINAL HYSTERECTOMY     partial hysterectomy, ovaries remain   BACK SURGERY     CHOLECYSTECTOMY N/A 11/27/2019   Procedure: LAPAROSCOPIC CHOLECYSTECTOMY;  Surgeon: Virl Cagey, MD;  [facility]: [facility];  Service: General;  Laterality: N/A;   COLONOSCOPY N/A 02/29/2020   Procedure: COLONOSCOPY;  Surgeon: Rogene Houston, MD;  [facility];  Service: Endoscopy;  Laterality: N/A;  8:45   ESOPHAGEAL DILATION N/A 11/17/2017   Procedure: ESOPHAGEAL DILATION;  Surgeon: Rogene Houston, MD;  [facility];  Service: Endoscopy;  Laterality: N/A;   ESOPHAGOGASTRODUODENOSCOPY N/A 11/17/2017   Procedure: ESOPHAGOGASTRODUODENOSCOPY (EGD);  Surgeon: Rogene Houston, MD;  [facility];  Service: Endoscopy;  Laterality: N/A;  12:00    ESOPHAGOGASTRODUODENOSCOPY N/A 11/02/2019   Procedure: ESOPHAGOGASTRODUODENOSCOPY (EGD);  Surgeon: Rogene Houston, MD;  [facility];  Service: Endoscopy;  Laterality: N/A;  945   FOOT SURGERY     POLYPECTOMY  02/29/2020   Procedure: POLYPECTOMY;  Surgeon: Rogene Houston, MD;  [facility];  Service: Endoscopy;;    There were no vitals filed for this visit.   Subjective Assessment - 10/10/20 1328     Subjective no pain or issues; working on her scar getting it softened.  STates having some nerve irritation and tingling down lateral LE    Currently in Pain? No/denies                               [facility] Adult PT Treatment/Exercise - 10/10/20 0001       Knee/Hip Exercises: Standing   Heel Raises 15 reps    Lateral Step Up Left;10 reps;Hand Hold: 1;Step Height: 6"    Forward Step Up Left;Hand Hold: 1;10 reps;Step Height: 6"    Step Down Left;10 reps;Hand Hold: 1;Step Height: 6"    Stairs 5RT 7" steps with 1 HR reciprocally      Knee/Hip Exercises: Seated   Sit to Sand 5 reps;without UE support      Knee/Hip Exercises:  Supine   Straight Leg Raises 10 reps;Left      Manual Therapy   Manual Therapy Soft tissue mobilization    Manual therapy comments completed seperately from all other skilled interventions    Soft tissue mobilization scar massage/release anterior thigh and scar                       PT Short Term Goals - 10/03/20 1256       PT SHORT TERM GOAL #1   Title Patient will be able to walk at least 300 feet in 2 minutes wtihout assistive device to improve overall function    Time 4    Period Weeks    Status On-going    Target Date 10/07/20      PT SHORT TERM GOAL #2   Title Patient will report at least 50% improvement in overall symptoms and/or function to demonstrate improved functional mobility    Time 4    Period Weeks    Status On-going    Target Date 10/07/20      PT SHORT TERM GOAL #3   Title  Patient will be independent in self management strategies to improve quality of life and functional outcomes.    Time 4    Period Weeks    Status On-going    Target Date 10/07/20               PT Long Term Goals - 10/03/20 1256       PT LONG TERM GOAL #1   Title Patient will be able to go up and down steps with reciprocal gait pattern and use of railing as needed to improve ability to go up her steps at home.    Time 8    Period Weeks    Status On-going      PT LONG TERM GOAL #2   Title Patient will meet predicted FOTO score to demonstrate improved overall function.    Time 8    Period Weeks    Status On-going      PT LONG TERM GOAL #3   Title Patient will report at least 75% improvement in overall symptoms and/or function to demonstrate improved functional mobility    Time 8    Period Weeks    Status On-going                   Plan - 10/10/20 1422     Clinical Impression Statement continued with strengthening for Lt LE/hip mm.  Pt able to complete 7" stairs reciprocally without antalgia or complaints of pain using 1 HR.  Pt mostly c/o ongoing tightness from scar.  Soft tissue completed with less tigntness as compared to last visit, however still limited.  Suggested silicone scar tape and continued self massage and vitamin E application.  PT verbalized understanding.  SLR added with visible weakness as well as STS to improve glute strength.  Pt overall progressing well and without complaints at end of session.    Personal Factors and Comorbidities Comorbidity 1    Comorbidities hx of lumbar surgery    Examination-Activity Limitations Transfers;Lift;Locomotion Level;Stairs;Stand;Squat;Bed Mobility    Examination-Participation Restrictions Driving;Community Activity;Shop;Cleaning;Yard Work;Medication Management;Meal Prep    Stability/Clinical Decision Making Stable/Uncomplicated    Rehab Potential Good    PT Frequency 2x / week    PT Duration 8 weeks    PT  Treatment/Interventions ADLs/Self Care Home Management;Aquatic Therapy;Cryotherapy;Moist Heat;Therapeutic exercise;Therapeutic activities;Stair training;Gait training;DME Instruction;Neuromuscular re-education;Patient/family education;Manual techniques;Dry needling;Joint  Manipulations;Balance training    PT Next Visit Plan progress standing, equal bil step length; functional strengthening while avoiding hyperextension.  Continue with scar massage to reduce pain/discomfort.    PT Home Exercise Plan heel slides, bent knee fall outs, walking, elevation    Consulted and Agree with Plan of Care Patient;Family member/caregiver    Family Member Consulted granddaughter - Dominica             Patient will benefit from skilled therapeutic intervention in order to improve the following deficits and impairments:  Pain, Decreased strength, Decreased activity tolerance, Decreased balance, Difficulty walking, Decreased range of motion, Postural dysfunction, Decreased mobility, Decreased knowledge of use of DME, Increased edema, Decreased scar mobility, Decreased skin integrity, Decreased endurance  Visit Diagnosis: Pain in left hip  Difficulty in walking, not elsewhere classified  Muscle weakness (generalized)  Other symptoms and signs involving the musculoskeletal system     Problem List Patient Active Problem List   Diagnosis Date Noted   Gallstones 11/17/2018   Gastroesophageal reflux disease without esophagitis 09/14/2017   Esophageal dysphagia 09/14/2017   Dyspnea 08/02/2013   GERD (gastroesophageal reflux disease) 08/02/2013   Teena Irani, PTA/CLT [phone]  Teena Irani, PTA 10/10/2020, 2:31 PM  [place], [place], [address] Phone: [phone]   Fax:  [phone]  Name: Laurie Bridges MRN: [id number] Date of Birth: [birth year]

## 2020-10-11 ENCOUNTER — Telehealth: Payer: Self-pay | Admitting: Internal Medicine

## 2020-10-11 DIAGNOSIS — R0602 Shortness of breath: Secondary | ICD-10-CM

## 2020-10-11 NOTE — Telephone Encounter (Signed)
Laurie Bridges feels since hip surgery she has become SOB when she walks to her mailbox. She can climbing stairs without issue. Denies CP.   She has apt with PCP and surgeon in 2 weeks but would like Dr.Ross's opinion.

## 2020-10-11 NOTE — Telephone Encounter (Signed)
Patient called stating that since her hip surgery 3 weeks ago she continues to stay short of breath, her head feels tight, BP & HR is having episodes of being elevated.  [phone].

## 2020-10-14 NOTE — Telephone Encounter (Signed)
Pleae set up for CBC, BMET, BP as well as an echo   Dx  SOB

## 2020-10-15 ENCOUNTER — Ambulatory Visit (HOSPITAL_COMMUNITY): Payer: Medicare Other | Attending: Orthopedic Surgery

## 2020-10-15 ENCOUNTER — Other Ambulatory Visit: Payer: Self-pay

## 2020-10-15 DIAGNOSIS — M25552 Pain in left hip: Secondary | ICD-10-CM | POA: Insufficient documentation

## 2020-10-15 DIAGNOSIS — M6281 Muscle weakness (generalized): Secondary | ICD-10-CM | POA: Insufficient documentation

## 2020-10-15 DIAGNOSIS — R262 Difficulty in walking, not elsewhere classified: Secondary | ICD-10-CM | POA: Diagnosis not present

## 2020-10-15 DIAGNOSIS — R29898 Other symptoms and signs involving the musculoskeletal system: Secondary | ICD-10-CM | POA: Insufficient documentation

## 2020-10-15 NOTE — Telephone Encounter (Signed)
Laurie Bridges called requesting to speak to [facility] regarding the upcoming echo and her recent hip surgery.

## 2020-10-15 NOTE — Therapy (Signed)
Laurie Bridges, [place], [address] Phone: [phone]   Fax:  [phone]  Physical Therapy Treatment  Patient Details  Name: Laurie Bridges MRN: [id number] Date of Birth: 04/16/1945 Referring Provider (PT): Edmonia Lynch   Encounter Date: 10/15/2020   PT End of Session - 10/15/20 1311     Visit Number 7    Number of Visits 16    Date for PT Re-Evaluation 11/18/20    Authorization Type BCBS no auth or VL    Progress Note Due on Visit 10    PT Start Time 1300    PT Stop Time 1345    PT Time Calculation (min) 45 min    Activity Tolerance Patient tolerated treatment well;No increased pain    Behavior During Therapy WFL for tasks assessed/performed             Past Medical History:  Diagnosis Date   Anemia    hx of Fe def many years ago   Anxiety    Arthritis    back   CAD (coronary artery disease)    a. s/p Coronary CT in 06/2018 showing mild stenosis along the mid-LAD, mid-LCx and mid-RCA   GERD (gastroesophageal reflux disease)    Hypertension    PONV (postoperative nausea and vomiting)     Past Surgical History:  Procedure Laterality Date   ABDOMINAL HYSTERECTOMY     partial hysterectomy, ovaries remain   BACK SURGERY     CHOLECYSTECTOMY N/A 11/27/2019   Procedure: LAPAROSCOPIC CHOLECYSTECTOMY;  Surgeon: Laurie Bridges;  [facility];  Service: General;  Laterality: N/A;   COLONOSCOPY N/A 02/29/2020   Procedure: COLONOSCOPY;  Surgeon: Laurie Bridges;  [facility];  Service: Endoscopy;  Laterality: N/A;  8:45   ESOPHAGEAL DILATION N/A 11/17/2017   Procedure: ESOPHAGEAL DILATION;  Surgeon: Laurie Bridges;  [facility];  Service: Endoscopy;  Laterality: N/A;   ESOPHAGOGASTRODUODENOSCOPY N/A 11/17/2017   Procedure: ESOPHAGOGASTRODUODENOSCOPY (EGD);  Surgeon: Laurie Bridges;  [facility];  Service: Endoscopy;  Laterality: N/A;  12:00    ESOPHAGOGASTRODUODENOSCOPY N/A 11/02/2019   Procedure: ESOPHAGOGASTRODUODENOSCOPY (EGD);  Surgeon: Laurie Bridges;  [facility];  Service: Endoscopy;  Laterality: N/A;  945   FOOT SURGERY     POLYPECTOMY  02/29/2020   Procedure: POLYPECTOMY;  Surgeon: Laurie Bridges;  [facility];  Service: Endoscopy;;    There were no vitals filed for this visit.   Subjective Assessment - 10/15/20 1309     Subjective Able to walk further distances but notice continued fatigue with walking 0.25 mile. Some discomfort continues in left groin    Currently in Pain? No/denies    Pain Score 0-No pain    Pain Location Groin    Pain Orientation Left                               [facility] Adult PT Treatment/Exercise - 10/15/20 0001       Knee/Hip Exercises: Stretches   Other Knee/Hip Stretches RLE single knee to chest with LLE maintained in extension with quad set to stretch left hip flexors      Knee/Hip Exercises: Standing   Heel Raises Both;20 reps    Hip Flexion Stengthening;Left;2 sets;10 reps    Hip Flexion Limitations red loop    Lateral Step Up Left;10  reps;Hand Hold: 1;Step Height: 6"    Forward Step Up Left;Hand Hold: 1;10 reps;Step Height: 6"    Step Down Left;10 reps;Hand Hold: 1;Step Height: 6"    Other Standing Knee Exercises sidestepping with red loop x 2 min      Knee/Hip Exercises: Supine   Bridges Strengthening;4 sets;5 reps   with red t-loop for abduction   Bridges Limitations 4x5 reps    Other Supine Knee/Hip Exercises hip flexion with red-loop 4x5 reps      Knee/Hip Exercises: Sidelying   Clams 4x5 reps with red t-loop                       PT Short Term Goals - 10/03/20 1256       PT SHORT TERM GOAL #1   Title Patient will be able to walk at least 300 feet in 2 minutes wtihout assistive device to improve overall function    Time 4    Period Weeks    Status On-going    Target Date 10/07/20      PT  SHORT TERM GOAL #2   Title Patient will report at least 50% improvement in overall symptoms and/or function to demonstrate improved functional mobility    Time 4    Period Weeks    Status On-going    Target Date 10/07/20      PT SHORT TERM GOAL #3   Title Patient will be independent in self management strategies to improve quality of life and functional outcomes.    Time 4    Period Weeks    Status On-going    Target Date 10/07/20               PT Long Term Goals - 10/03/20 1256       PT LONG TERM GOAL #1   Title Patient will be able to go up and down steps with reciprocal gait pattern and use of railing as needed to improve ability to go up her steps at home.    Time 8    Period Weeks    Status On-going      PT LONG TERM GOAL #2   Title Patient will meet predicted FOTO score to demonstrate improved overall function.    Time 8    Period Weeks    Status On-going      PT LONG TERM GOAL #3   Title Patient will report at least 75% improvement in overall symptoms and/or function to demonstrate improved functional mobility    Time 8    Period Weeks    Status On-going                   Plan - 10/15/20 1348     Clinical Impression Statement Progressing with POC details and PRE for LLE strength using red-band for isolated hip strengthening. Difficulty with left hip extension to neutral evident in supine likely due to psoas tightness. Pt tolerating sessions well without issue.  Progress activities to mimic bowling motions when possible    Personal Factors and Comorbidities Comorbidity 1    Comorbidities hx of lumbar surgery    Examination-Activity Limitations Transfers;Lift;Locomotion Level;Stairs;Stand;Squat;Bed Mobility    Examination-Participation Restrictions Driving;Community Activity;Shop;Cleaning;Yard Work;Medication Management;Meal Prep    Stability/Clinical Decision Making Stable/Uncomplicated    Rehab Potential Good    PT Frequency 2x / week    PT  Duration 8 weeks    PT Treatment/Interventions ADLs/Self Care Home Management;Aquatic Therapy;Cryotherapy;Moist Heat;Therapeutic exercise;Therapeutic activities;Stair training;Gait  training;DME Instruction;Neuromuscular re-education;Patient/family education;Manual techniques;Dry needling;Joint Manipulations;Balance training    PT Next Visit Plan progress standing, equal bil step length; functional strengthening while avoiding hyperextension.  Continue with scar massage to reduce pain/discomfort.    PT Home Exercise Plan heel slides, bent knee fall outs, walking, elevation    Consulted and Agree with Plan of Care Patient;Family member/caregiver    Family Member Consulted granddaughter - Dominica             Patient will benefit from skilled therapeutic intervention in order to improve the following deficits and impairments:  Pain, Decreased strength, Decreased activity tolerance, Decreased balance, Difficulty walking, Decreased range of motion, Postural dysfunction, Decreased mobility, Decreased knowledge of use of DME, Increased edema, Decreased scar mobility, Decreased skin integrity, Decreased endurance  Visit Diagnosis: Pain in left hip  Difficulty in walking, not elsewhere classified  Muscle weakness (generalized)  Other symptoms and signs involving the musculoskeletal system     Problem List Patient Active Problem List   Diagnosis Date Noted   Gallstones 11/17/2018   Gastroesophageal reflux disease without esophagitis 09/14/2017   Esophageal dysphagia 09/14/2017   Dyspnea 08/02/2013   GERD (gastroesophageal reflux disease) 08/02/2013   1:50 PM, 10/15/20 M. Sherlyn Lees, PT, DPT Physical Therapist- [place] Office Number: [phone]   [facility] [place] [address] [place], [place], [address] Phone: [phone]   Fax:  [phone]  Name: CAROLEANN CASLER MRN: [id number] Date of Birth: Oct 16, 1945

## 2020-10-15 NOTE — Telephone Encounter (Signed)
Orders entered, echo scheduled for 11/2 at 930 at [facility]. Patient will get labs done this week.

## 2020-10-16 ENCOUNTER — Other Ambulatory Visit (HOSPITAL_COMMUNITY)
Admission: RE | Admit: 2020-10-16 | Discharge: 2020-10-16 | Disposition: A | Discharge status: Home / Self Care | Payer: Medicare Other | Source: Ambulatory Visit | Attending: Internal Medicine | Admitting: Internal Medicine

## 2020-10-16 DIAGNOSIS — R0602 Shortness of breath: Secondary | ICD-10-CM | POA: Diagnosis not present

## 2020-10-16 LAB — BASIC METABOLIC PANEL
Anion gap: 7 (ref 5–15)
BUN: 15 mg/dL (ref 8–23)
CO2: 24 mmol/L (ref 22–32)
Calcium: 9.6 mg/dL (ref 8.9–10.3)
Chloride: 108 mmol/L (ref 98–111)
Creatinine, Ser: 0.95 mg/dL (ref 0.44–1.00)
GFR, Estimated: >60 mL/min (ref >60)
Glucose, Bld: 120 mg/dL — ABNORMAL HIGH (ref 70–99)
Potassium: 3.8 mmol/L (ref 3.5–5.1)
Sodium: 139 mmol/L (ref 135–145)

## 2020-10-16 LAB — CBC
HCT: 37.2 % (ref 36.0–46.0)
Hemoglobin: 11.9 g/dL — ABNORMAL LOW (ref 12.0–15.0)
MCH: 28.4 pg (ref 26.0–34.0)
MCHC: 32 g/dL (ref 30.0–36.0)
MCV: 88.8 fL (ref 80.0–100.0)
Platelets: 339 10*3/uL (ref 150–400)
RBC: 4.19 MIL/uL (ref 3.87–5.11)
RDW: 13.2 % (ref 11.5–15.5)
WBC: 5.3 10*3/uL (ref 4.0–10.5)
nRBC: 0 % (ref 0.0–0.2)

## 2020-10-16 LAB — BRAIN NATRIURETIC PEPTIDE: B Natriuretic Peptide: 43 pg/mL (ref 0.0–100.0)

## 2020-10-17 ENCOUNTER — Encounter (HOSPITAL_COMMUNITY): Payer: Self-pay | Admitting: Physical Therapy

## 2020-10-17 ENCOUNTER — Other Ambulatory Visit: Payer: Self-pay

## 2020-10-17 ENCOUNTER — Ambulatory Visit (HOSPITAL_COMMUNITY): Payer: Medicare Other | Admitting: Physical Therapy

## 2020-10-17 DIAGNOSIS — R262 Difficulty in walking, not elsewhere classified: Secondary | ICD-10-CM | POA: Diagnosis not present

## 2020-10-17 DIAGNOSIS — R29898 Other symptoms and signs involving the musculoskeletal system: Secondary | ICD-10-CM | POA: Diagnosis not present

## 2020-10-17 DIAGNOSIS — M25552 Pain in left hip: Secondary | ICD-10-CM

## 2020-10-17 DIAGNOSIS — M6281 Muscle weakness (generalized): Secondary | ICD-10-CM

## 2020-10-17 NOTE — Therapy (Signed)
[place], [place], [address] Phone: [phone]   Fax:  [phone]  Physical Therapy Treatment  Patient Details  Name: Laurie Bridges MRN: [id number] Date of Birth: [birth year] Referring Provider (PT): Edmonia Lynch   Encounter Date: 10/17/2020   PT End of Session - 10/17/20 1317     Visit Number 8    Number of Visits 16    Date for PT Re-Evaluation 11/18/20    Authorization Type BCBS no auth or VL    Progress Note Due on Visit 10    PT Start Time 0762    PT Stop Time 2633    PT Time Calculation (min) 38 min    Activity Tolerance Patient tolerated treatment well;No increased pain    Behavior During Therapy WFL for tasks assessed/performed             Past Medical History:  Diagnosis Date   Anemia    hx of Fe def many years ago   Anxiety    Arthritis    back   CAD (coronary artery disease)    a. s/p Coronary CT in 06/2018 showing mild stenosis along the mid-LAD, mid-LCx and mid-RCA   GERD (gastroesophageal reflux disease)    Hypertension    PONV (postoperative nausea and vomiting)     Past Surgical History:  Procedure Laterality Date   ABDOMINAL HYSTERECTOMY     partial hysterectomy, ovaries remain   BACK SURGERY     CHOLECYSTECTOMY N/A 11/27/2019   Procedure: LAPAROSCOPIC CHOLECYSTECTOMY;  Surgeon: Virl Cagey, MD;  [facility]: [facility];  Service: General;  Laterality: N/A;   COLONOSCOPY N/A 02/29/2020   Procedure: COLONOSCOPY;  Surgeon: Rogene Houston, MD;  [facility];  Service: Endoscopy;  Laterality: N/A;  8:45   ESOPHAGEAL DILATION N/A 11/17/2017   Procedure: ESOPHAGEAL DILATION;  Surgeon: Rogene Houston, MD;  [facility];  Service: Endoscopy;  Laterality: N/A;   ESOPHAGOGASTRODUODENOSCOPY N/A 11/17/2017   Procedure: ESOPHAGOGASTRODUODENOSCOPY (EGD);  Surgeon: Rogene Houston, MD;  [facility];  Service: Endoscopy;  Laterality: N/A;  12:00    ESOPHAGOGASTRODUODENOSCOPY N/A 11/02/2019   Procedure: ESOPHAGOGASTRODUODENOSCOPY (EGD);  Surgeon: Rogene Houston, MD;  [facility];  Service: Endoscopy;  Laterality: N/A;  945   FOOT SURGERY     POLYPECTOMY  02/29/2020   Procedure: POLYPECTOMY;  Surgeon: Rogene Houston, MD;  [facility];  Service: Endoscopy;;    There were no vitals filed for this visit.   Subjective Assessment - 10/17/20 1321     Subjective States she still has some tightness. Reports she has been doing her stretchy band exercises. States that she has been doing some walking.    Currently in Pain? No/denies                [facility] PT Assessment - 10/17/20 0001       Assessment   Medical Diagnosis left anterior THR    Referring Provider (PT) Edmonia Lynch    Onset Date/Surgical Date 09/19/20                           [facility] Adult PT Treatment/Exercise - 10/17/20 0001       Ambulation/Gait   Gait Comments gait - 226 feet - cues to stand tall and lengthen step      Knee/Hip Exercises: Standing   Other Standing Knee Exercises hip  hiking on step - tactile cues - very difficult to perform- attempted 6 minutes      Knee/Hip Exercises: Seated   Sit to Sand --   15 mijnutes total - form - cues to keep knees wide, not use momentum, bending knees and hips     Knee/Hip Exercises: Supine   Bridges 5 reps   15" holds - cues to press on left side and keep knees wide.     Knee/Hip Exercises: Prone   Hamstring Curl --   5 minutes left with 15" holds   Other Prone Exercises heel press 5" holds 2 minutes total    Other Prone Exercises hamstring curl - 5 minutes - 15" holds L                     PT Education - 10/17/20 1334     Education Details on difference between knee flexion and hip extension, on actual ROM restrictions and limitations (forceful extension)    Person(s) Educated Patient    Methods Explanation    Comprehension Verbalized understanding               PT Short Term Goals - 10/03/20 1256       PT SHORT TERM GOAL #1   Title Patient will be able to walk at least 300 feet in 2 minutes wtihout assistive device to improve overall function    Time 4    Period Weeks    Status On-going    Target Date 10/07/20      PT SHORT TERM GOAL #2   Title Patient will report at least 50% improvement in overall symptoms and/or function to demonstrate improved functional mobility    Time 4    Period Weeks    Status On-going    Target Date 10/07/20      PT SHORT TERM GOAL #3   Title Patient will be independent in self management strategies to improve quality of life and functional outcomes.    Time 4    Period Weeks    Status On-going    Target Date 10/07/20               PT Long Term Goals - 10/03/20 1256       PT LONG TERM GOAL #1   Title Patient will be able to go up and down steps with reciprocal gait pattern and use of railing as needed to improve ability to go up her steps at home.    Time 8    Period Weeks    Status On-going      PT LONG TERM GOAL #2   Title Patient will meet predicted FOTO score to demonstrate improved overall function.    Time 8    Period Weeks    Status On-going      PT LONG TERM GOAL #3   Title Patient will report at least 75% improvement in overall symptoms and/or function to demonstrate improved functional mobility    Time 8    Period Weeks    Status On-going                   Plan - 10/17/20 1357     Clinical Impression Statement Patient with right lateral shift with walking. Trialed hip hike but too challenging for patient. Notable hip flexor length shortness. Educated patient in importance of quadriceps lengthening. Focused on exercises where hip flexors were lengthened and glutes activated. Cues for sit to stand for  form as patient tends to collapse knees and favor right leg. Will continue with current HEP.    Personal Factors and Comorbidities Comorbidity 1     Comorbidities hx of lumbar surgery    Examination-Activity Limitations Transfers;Lift;Locomotion Level;Stairs;Stand;Squat;Bed Mobility    Examination-Participation Restrictions Driving;Community Activity;Shop;Cleaning;Yard Work;Medication Management;Meal Prep    Stability/Clinical Decision Making Stable/Uncomplicated    Rehab Potential Good    PT Frequency 2x / week    PT Duration 8 weeks    PT Treatment/Interventions ADLs/Self Care Home Management;Aquatic Therapy;Cryotherapy;Moist Heat;Therapeutic exercise;Therapeutic activities;Stair training;Gait training;DME Instruction;Neuromuscular re-education;Patient/family education;Manual techniques;Dry needling;Joint Manipulations;Balance training    PT Next Visit Plan progress standing, equal bil step length; functional strengthening while avoiding hyperextension.  Continue with scar massage to reduce pain/discomfort.    PT Home Exercise Plan heel slides, bent knee fall outs, walking, elevation; 10/6 STS, prone knee bend    Consulted and Agree with Plan of Care Patient;Family member/caregiver    Family Member Consulted granddaughter - Dominica             Patient will benefit from skilled therapeutic intervention in order to improve the following deficits and impairments:  Pain, Decreased strength, Decreased activity tolerance, Decreased balance, Difficulty walking, Decreased range of motion, Postural dysfunction, Decreased mobility, Decreased knowledge of use of DME, Increased edema, Decreased scar mobility, Decreased skin integrity, Decreased endurance  Visit Diagnosis: Pain in left hip  Difficulty in walking, not elsewhere classified  Muscle weakness (generalized)  Other symptoms and signs involving the musculoskeletal system     Problem List Patient Active Problem List   Diagnosis Date Noted   Gallstones 11/17/2018   Gastroesophageal reflux disease without esophagitis 09/14/2017   Esophageal dysphagia 09/14/2017   Dyspnea  08/02/2013   GERD (gastroesophageal reflux disease) 08/02/2013   1:58 PM, 10/17/20 Jerene Pitch, DPT Physical Therapy with [facility]  [phone] office   [place] [address] [place], [place], [address] Phone: [phone]   Fax:  [phone]  Name: Laurie Bridges MRN: [id number] Date of Birth: 07/31/1945

## 2020-10-22 ENCOUNTER — Ambulatory Visit (HOSPITAL_COMMUNITY): Payer: Medicare Other | Admitting: Physical Therapy

## 2020-10-22 ENCOUNTER — Other Ambulatory Visit: Payer: Self-pay

## 2020-10-22 DIAGNOSIS — R29898 Other symptoms and signs involving the musculoskeletal system: Secondary | ICD-10-CM

## 2020-10-22 DIAGNOSIS — R262 Difficulty in walking, not elsewhere classified: Secondary | ICD-10-CM

## 2020-10-22 DIAGNOSIS — M6281 Muscle weakness (generalized): Secondary | ICD-10-CM

## 2020-10-22 DIAGNOSIS — M25552 Pain in left hip: Secondary | ICD-10-CM | POA: Diagnosis not present

## 2020-10-22 NOTE — Therapy (Signed)
[place] [address] [place], [place], [address] Phone: [phone]   Fax:  [phone]  Physical Therapy Treatment and Progress Note  Patient Details  Name: ARDELIA WREDE MRN: [id number] Date of Birth: 1945-04-26 Referring Provider (PT): Edmonia Lynch  Progress Note Reporting Period 09/23/20 to 10/22/20  See note below for Objective Data and Assessment of Progress/Goals.      Encounter Date: 10/22/2020   PT End of Session - 10/22/20 1317     Visit Number 9    Number of Visits 16    Date for PT Re-Evaluation 11/18/20    Authorization Type BCBS no auth or VL    Progress Note Due on Visit 10    PT Start Time 7096    PT Stop Time 1355    PT Time Calculation (min) 38 min    Activity Tolerance Patient tolerated treatment well;No increased pain    Behavior During Therapy WFL for tasks assessed/performed             Past Medical History:  Diagnosis Date   Anemia    hx of Fe def many years ago   Anxiety    Arthritis    back   CAD (coronary artery disease)    a. s/p Coronary CT in 06/2018 showing mild stenosis along the mid-LAD, mid-LCx and mid-RCA   GERD (gastroesophageal reflux disease)    Hypertension    PONV (postoperative nausea and vomiting)     Past Surgical History:  Procedure Laterality Date   ABDOMINAL HYSTERECTOMY     partial hysterectomy, ovaries remain   BACK SURGERY     CHOLECYSTECTOMY N/A 11/27/2019   Procedure: LAPAROSCOPIC CHOLECYSTECTOMY;  Surgeon: Virl Cagey, MD;  [facility]: [facility];  Service: General;  Laterality: N/A;   COLONOSCOPY N/A 02/29/2020   Procedure: COLONOSCOPY;  Surgeon: Rogene Houston, MD;  [facility];  Service: Endoscopy;  Laterality: N/A;  8:45   ESOPHAGEAL DILATION N/A 11/17/2017   Procedure: ESOPHAGEAL DILATION;  Surgeon: Rogene Houston, MD;  [facility];  Service: Endoscopy;  Laterality: N/A;   ESOPHAGOGASTRODUODENOSCOPY N/A 11/17/2017    Procedure: ESOPHAGOGASTRODUODENOSCOPY (EGD);  Surgeon: Rogene Houston, MD;  [facility];  Service: Endoscopy;  Laterality: N/A;  12:00   ESOPHAGOGASTRODUODENOSCOPY N/A 11/02/2019   Procedure: ESOPHAGOGASTRODUODENOSCOPY (EGD);  Surgeon: Rogene Houston, MD;  [facility];  Service: Endoscopy;  Laterality: N/A;  945   FOOT SURGERY     POLYPECTOMY  02/29/2020   Procedure: POLYPECTOMY;  Surgeon: Rogene Houston, MD;  [facility];  Service: Endoscopy;;    There were no vitals filed for this visit.   Subjective Assessment - 10/22/20 1321     Subjective States that she has been having the same nerve pain in her left hip no number provided just reported as stinging and no numerical number provided.    Currently in Pain? Yes                [facility] PT Assessment - 10/22/20 0001       Assessment   Medical Diagnosis left anterior THR    Referring Provider (PT) Edmonia Lynch    Onset Date/Surgical Date 09/19/20      Observation/Other Assessments   Focus on Therapeutic Outcomes (FOTO)  48% function      Posture/Postural Control   Posture/Postural Control Postural limitations    Posture Comments can't sit still, shifts weight to right ischial  tuberosity and shifts weight to right      AROM   Left Hip Flexion 100   no pain   Left Knee Extension 5   equal to the other side     Palpation   Palpation comment tenderness to palpation around lateral left hip      Special Tests    Special Tests Lumbar    Lumbar Tests other      other   Comments ely's test + on lfet      Ambulation/Gait   Ambulation/Gait Yes    Stairs Yes    Stairs Assistance 7: Independent    Stair Management Technique One rail Right;Alternating pattern    Gait Comments 2MW                           [facility] Adult PT Treatment/Exercise - 10/22/20 0001       Ambulation/Gait   Ambulation Distance (Feet) 452 Feet    Assistive device None    Gait Pattern  Decreased stance time - left;Decreased stride length;Decreased hip/knee flexion - left;Decreased weight shift to left      Knee/Hip Exercises: Prone   Other Prone Exercises hamstring curl - 5 minutes - 15" holds L                     PT Education - 10/22/20 1327     Education Details on FOTO score, on current presentation, on HEP. on posture and plan going forward on upcoming MD apt.    Person(s) Educated Patient    Methods Explanation    Comprehension Verbalized understanding              PT Short Term Goals - 10/22/20 1343       PT SHORT TERM GOAL #1   Title Patient will be able to walk at least 300 feet in 2 minutes wtihout assistive device to improve overall function    Time 4    Period Weeks    Status Achieved    Target Date 10/07/20      PT SHORT TERM GOAL #2   Title Patient will report at least 50% improvement in overall symptoms and/or function to demonstrate improved functional mobility    Time 4    Period Weeks    Status Achieved    Target Date 10/07/20      PT SHORT TERM GOAL #3   Title Patient will be independent in self management strategies to improve quality of life and functional outcomes.    Time 4    Period Weeks    Status Achieved    Target Date 10/07/20               PT Long Term Goals - 10/22/20 1343       PT LONG TERM GOAL #1   Title Patient will be able to go up and down steps with reciprocal gait pattern and use of railing as needed to improve ability to go up her steps at home.    Time 8    Period Weeks    Status Achieved      PT LONG TERM GOAL #2   Title Patient will meet predicted FOTO score to demonstrate improved overall function.    Baseline on going    Time 8    Period Weeks    Status On-going      PT LONG TERM GOAL #3   Title Patient will  report at least 75% improvement in overall symptoms and/or function to demonstrate improved functional mobility    Time 8    Period Weeks    Status On-going                    Plan - 10/22/20 1356     Clinical Impression Statement Patient has met all short term goals and 1/3 long term goals at this time. Answered all questions and reviewed HEP. Printed off exercises for patient to focus on. Will put PT on HOLD for 3 weeks per patient presentation and request and will follow up with her in 3 weeks time with her performing exercises daily.    Personal Factors and Comorbidities Comorbidity 1    Comorbidities hx of lumbar surgery    Examination-Activity Limitations Transfers;Lift;Locomotion Level;Stairs;Stand;Squat;Bed Mobility    Examination-Participation Restrictions Driving;Community Activity;Shop;Cleaning;Yard Work;Medication Management;Meal Prep    Stability/Clinical Decision Making Stable/Uncomplicated    Rehab Potential Good    PT Frequency 2x / week    PT Duration 8 weeks    PT Treatment/Interventions ADLs/Self Care Home Management;Aquatic Therapy;Cryotherapy;Moist Heat;Therapeutic exercise;Therapeutic activities;Stair training;Gait training;DME Instruction;Neuromuscular re-education;Patient/family education;Manual techniques;Dry needling;Joint Manipulations;Balance training    PT Next Visit Plan reassess and determine if patient needs continued therapy    PT Home Exercise Plan heel slides, bent knee fall outs, walking, elevation; 10/6 STS, prone knee bend    Consulted and Agree with Plan of Care Patient;Family member/caregiver    Family Member Consulted granddaughter - Dominica             Patient will benefit from skilled therapeutic intervention in order to improve the following deficits and impairments:  Pain, Decreased strength, Decreased activity tolerance, Decreased balance, Difficulty walking, Decreased range of motion, Postural dysfunction, Decreased mobility, Decreased knowledge of use of DME, Increased edema, Decreased scar mobility, Decreased skin integrity, Decreased endurance  Visit Diagnosis: Pain in left hip  Difficulty  in walking, not elsewhere classified  Muscle weakness (generalized)  Other symptoms and signs involving the musculoskeletal system     Problem List Patient Active Problem List   Diagnosis Date Noted   Gallstones 11/17/2018   Gastroesophageal reflux disease without esophagitis 09/14/2017   Esophageal dysphagia 09/14/2017   Dyspnea 08/02/2013   GERD (gastroesophageal reflux disease) 08/02/2013  1:57 PM, 10/22/20 Jerene Pitch, DPT Physical Therapy with [facility]  [phone] office   [place] [address] [place], [place], [address] Phone: [phone]   Fax:  [phone]  Name: ZILPHA MCANDREW MRN: [id number] Date of Birth: [birth year]

## 2020-10-24 ENCOUNTER — Encounter (HOSPITAL_COMMUNITY): Payer: BC Managed Care – PPO | Admitting: Physical Therapy

## 2020-10-24 DIAGNOSIS — Z23 Encounter for immunization: Secondary | ICD-10-CM | POA: Diagnosis not present

## 2020-10-24 DIAGNOSIS — N3281 Overactive bladder: Secondary | ICD-10-CM | POA: Diagnosis not present

## 2020-10-24 DIAGNOSIS — I10 Essential (primary) hypertension: Secondary | ICD-10-CM | POA: Diagnosis not present

## 2020-10-24 DIAGNOSIS — R0609 Other forms of dyspnea: Secondary | ICD-10-CM | POA: Diagnosis not present

## 2020-11-13 ENCOUNTER — Other Ambulatory Visit: Payer: Self-pay

## 2020-11-13 ENCOUNTER — Ambulatory Visit (HOSPITAL_COMMUNITY)
Admission: RE | Admit: 2020-11-13 | Discharge: 2020-11-13 | Disposition: A | Discharge status: Home / Self Care | Payer: Medicare Other | Source: Ambulatory Visit | Attending: Internal Medicine | Admitting: Internal Medicine

## 2020-11-13 DIAGNOSIS — R0602 Shortness of breath: Secondary | ICD-10-CM

## 2020-11-13 LAB — ECHOCARDIOGRAM COMPLETE
Area-P 1/2: 4.04 cm2
S' Lateral: 3.4 cm

## 2020-11-13 NOTE — Progress Notes (Signed)
  Echocardiogram 2D Echocardiogram has been performed.  Laurie Bridges 11/13/2020, 10:05 AM

## 2020-11-14 ENCOUNTER — Ambulatory Visit (HOSPITAL_COMMUNITY): Payer: Medicare Other | Attending: Orthopedic Surgery | Admitting: Physical Therapy

## 2020-11-14 ENCOUNTER — Encounter (HOSPITAL_COMMUNITY): Payer: Self-pay | Admitting: Physical Therapy

## 2020-11-14 DIAGNOSIS — R262 Difficulty in walking, not elsewhere classified: Secondary | ICD-10-CM | POA: Diagnosis not present

## 2020-11-14 DIAGNOSIS — M25552 Pain in left hip: Secondary | ICD-10-CM | POA: Insufficient documentation

## 2020-11-14 DIAGNOSIS — M6281 Muscle weakness (generalized): Secondary | ICD-10-CM | POA: Diagnosis not present

## 2020-11-14 DIAGNOSIS — R29898 Other symptoms and signs involving the musculoskeletal system: Secondary | ICD-10-CM | POA: Diagnosis not present

## 2020-11-14 NOTE — Therapy (Signed)
[place] [address] [place], [place], [address] Phone: [phone]   Fax:  [phone]  Physical Therapy Treatment and Progress Note and Discharge Note  Patient Details  Name: Laurie Bridges MRN: [id number] Date of Birth: 05/19/1945 Referring Provider (PT): Edmonia Lynch  Progress Note Reporting Period 09/23/20 to 11/14/20  See note below for Objective Data and Assessment of Progress/Goals.      Encounter Date: 11/14/2020   PT End of Session - 11/14/20 1128     Visit Number 10    Number of Visits 16    Date for PT Re-Evaluation 11/18/20    Authorization Type BCBS no auth or VL    Progress Note Due on Visit 10    PT Start Time 1129    PT Stop Time 1200    PT Time Calculation (min) 31 min    Activity Tolerance Patient tolerated treatment well;No increased pain    Behavior During Therapy WFL for tasks assessed/performed             Past Medical History:  Diagnosis Date   Anemia    hx of Fe def many years ago   Anxiety    Arthritis    back   CAD (coronary artery disease)    a. s/p Coronary CT in 06/2018 showing mild stenosis along the mid-LAD, mid-LCx and mid-RCA   GERD (gastroesophageal reflux disease)    Hypertension    PONV (postoperative nausea and vomiting)     Past Surgical History:  Procedure Laterality Date   ABDOMINAL HYSTERECTOMY     partial hysterectomy, ovaries remain   BACK SURGERY     CHOLECYSTECTOMY N/A 11/27/2019   Procedure: LAPAROSCOPIC CHOLECYSTECTOMY;  Surgeon: Virl Cagey, MD;  [facility];  Service: General;  Laterality: N/A;   COLONOSCOPY N/A 02/29/2020   Procedure: COLONOSCOPY;  Surgeon: Rogene Houston, MD;  [facility];  Service: Endoscopy;  Laterality: N/A;  8:45   ESOPHAGEAL DILATION N/A 11/17/2017   Procedure: ESOPHAGEAL DILATION;  Surgeon: Rogene Houston, MD;  [facility];  Service: Endoscopy;  Laterality: N/A;    ESOPHAGOGASTRODUODENOSCOPY N/A 11/17/2017   Procedure: ESOPHAGOGASTRODUODENOSCOPY (EGD);  Surgeon: Rogene Houston, MD;  [facility];  Service: Endoscopy;  Laterality: N/A;  12:00   ESOPHAGOGASTRODUODENOSCOPY N/A 11/02/2019   Procedure: ESOPHAGOGASTRODUODENOSCOPY (EGD);  Surgeon: Rogene Houston, MD;  [facility];  Service: Endoscopy;  Laterality: N/A;  945   FOOT SURGERY     POLYPECTOMY  02/29/2020   Procedure: POLYPECTOMY;  Surgeon: Rogene Houston, MD;  [facility];  Service: Endoscopy;;    There were no vitals filed for this visit.   Subjective Assessment - 11/14/20 1138     Subjective States she feels things are going pretty well. States she hasn't done a lot of the exercises but she is doing some. States she is to follow up with the MD in a year. Reports 60-70% better                [facility] PT Assessment - 11/14/20 0001       Assessment   Medical Diagnosis left anterior THR    Referring Provider (PT) Edmonia Lynch      Observation/Other Assessments   Focus on Therapeutic Outcomes (FOTO)  55% function - predicted 56% function  [facility] Adult PT Treatment/Exercise - 11/14/20 0001       Knee/Hip Exercises: Seated   Sit to Sand 20 reps;without UE support   cues to feep feet on floor, keep knees inline with hips and slow descent                    PT Education - 11/14/20 1138     Education Details educated patient on current posture, focusing on reducing right weight shift with sitting. on turning desired movemnets into stretches/exercises (retutrn to bowling)    Person(s) Educated Patient    Methods Explanation    Comprehension Verbalized understanding              PT Short Term Goals - 10/22/20 1343       PT SHORT TERM GOAL #1   Title Patient will be able to walk at least 300 feet in 2 minutes wtihout assistive device to improve overall function    Time 4    Period  Weeks    Status Achieved    Target Date 10/07/20      PT SHORT TERM GOAL #2   Title Patient will report at least 50% improvement in overall symptoms and/or function to demonstrate improved functional mobility    Time 4    Period Weeks    Status Achieved    Target Date 10/07/20      PT SHORT TERM GOAL #3   Title Patient will be independent in self management strategies to improve quality of life and functional outcomes.    Time 4    Period Weeks    Status Achieved    Target Date 10/07/20               PT Long Term Goals - 11/14/20 1145       PT LONG TERM GOAL #1   Title Patient will be able to go up and down steps with reciprocal gait pattern and use of railing as needed to improve ability to go up her steps at home.    Time 8    Period Weeks    Status Achieved      PT LONG TERM GOAL #2   Title Patient will meet predicted FOTO score to demonstrate improved overall function.    Baseline on going    Time 8    Period Weeks    Status On-going      PT LONG TERM GOAL #3   Title Patient will report at least 75% improvement in overall symptoms and/or function to demonstrate improved functional mobility    Time 8    Period Weeks    Status On-going                   Plan - 11/14/20 1151     Clinical Impression Statement Overall patient is doing well. She has met all but 2 long term goals. Educated patient on current progress, importance of daily stretches/mobility, FOTO score and posture. Answered all questions prior to end for session and patient to discharge from PT to HEP secondary to progress made.    Personal Factors and Comorbidities Comorbidity 1    Comorbidities hx of lumbar surgery    Examination-Activity Limitations Transfers;Lift;Locomotion Level;Stairs;Stand;Squat;Bed Mobility    Examination-Participation Restrictions Driving;Community Activity;Shop;Cleaning;Yard Work;Medication Management;Meal Prep    Stability/Clinical Decision Making  Stable/Uncomplicated    Rehab Potential Good    PT Frequency 2x / week    PT Duration 8 weeks  PT Treatment/Interventions ADLs/Self Care Home Management;Aquatic Therapy;Cryotherapy;Moist Heat;Therapeutic exercise;Therapeutic activities;Stair training;Gait training;DME Instruction;Neuromuscular re-education;Patient/family education;Manual techniques;Dry needling;Joint Manipulations;Balance training    PT Next Visit Plan DC to HEP.    PT Home Exercise Plan heel slides, bent knee fall outs, walking, elevation; 10/6 STS, prone knee bend    Consulted and Agree with Plan of Care Patient;Family member/caregiver    Family Member Consulted granddaughter - Dominica             Patient will benefit from skilled therapeutic intervention in order to improve the following deficits and impairments:  Pain, Decreased strength, Decreased activity tolerance, Decreased balance, Difficulty walking, Decreased range of motion, Postural dysfunction, Decreased mobility, Decreased knowledge of use of DME, Increased edema, Decreased scar mobility, Decreased skin integrity, Decreased endurance  Visit Diagnosis: Pain in left hip  Difficulty in walking, not elsewhere classified  Muscle weakness (generalized)  Other symptoms and signs involving the musculoskeletal system     Problem List Patient Active Problem List   Diagnosis Date Noted   Gallstones 11/17/2018   Gastroesophageal reflux disease without esophagitis 09/14/2017   Esophageal dysphagia 09/14/2017   Dyspnea 08/02/2013   GERD (gastroesophageal reflux disease) 08/02/2013   12:06 PM, 11/14/20 Jerene Pitch, DPT Physical Therapy with [facility]  [phone] office   [place] [address] [place], [place], [address] Phone: [phone]   Fax:  [phone]  Name: Laurie Bridges MRN: [id number] Date of Birth: 1945-04-06

## 2020-11-15 ENCOUNTER — Telehealth: Payer: Self-pay

## 2020-11-15 ENCOUNTER — Other Ambulatory Visit (HOSPITAL_COMMUNITY)
Admission: RE | Admit: 2020-11-15 | Discharge: 2020-11-15 | Disposition: A | Discharge status: Home / Self Care | Payer: Medicare Other | Source: Ambulatory Visit | Attending: Internal Medicine | Admitting: Internal Medicine

## 2020-11-15 ENCOUNTER — Other Ambulatory Visit: Payer: Self-pay

## 2020-11-15 ENCOUNTER — Other Ambulatory Visit (HOSPITAL_COMMUNITY): Admission: RE | Admit: 2020-11-15 | Payer: Medicare Other | Source: Ambulatory Visit | Admitting: Internal Medicine

## 2020-11-15 DIAGNOSIS — R0602 Shortness of breath: Secondary | ICD-10-CM | POA: Diagnosis not present

## 2020-11-15 LAB — BASIC METABOLIC PANEL
Anion gap: 4 — ABNORMAL LOW (ref 5–15)
BUN: 12 mg/dL (ref 8–23)
CO2: 28 mmol/L (ref 22–32)
Calcium: 9.7 mg/dL (ref 8.9–10.3)
Chloride: 107 mmol/L (ref 98–111)
Creatinine, Ser: 0.96 mg/dL (ref 0.44–1.00)
GFR, Estimated: >60 mL/min (ref >60)
Glucose, Bld: 95 mg/dL (ref 70–99)
Potassium: 3.9 mmol/L (ref 3.5–5.1)
Sodium: 139 mmol/L (ref 135–145)

## 2020-11-15 NOTE — Telephone Encounter (Signed)
Echo results discussed with patient, order placed for chest ct to r/o PE. Pt will have bmet done today.front office staff will schedule apt.

## 2020-11-15 NOTE — Telephone Encounter (Signed)
-----   Message from Fay Records, MD sent at 11/14/2020 10:57 PM EDT ----- Echo looks good  Pumping function of heart is good   Valves working OK SOB occurred after surgery   Would consider high resolution CT to r/o [place] surgery

## 2020-11-21 ENCOUNTER — Other Ambulatory Visit: Payer: Self-pay

## 2020-11-21 ENCOUNTER — Encounter (INDEPENDENT_AMBULATORY_CARE_PROVIDER_SITE_OTHER): Payer: Self-pay | Admitting: Internal Medicine

## 2020-11-21 ENCOUNTER — Ambulatory Visit (INDEPENDENT_AMBULATORY_CARE_PROVIDER_SITE_OTHER): Payer: Medicare Other | Admitting: Internal Medicine

## 2020-11-21 VITALS — BP 143/83 | HR 84 | Temp 98.5°F | Ht 66.0 in | Wt 160.8 lb

## 2020-11-21 DIAGNOSIS — K219 Gastro-esophageal reflux disease without esophagitis: Secondary | ICD-10-CM | POA: Diagnosis not present

## 2020-11-21 DIAGNOSIS — I2510 Atherosclerotic heart disease of native coronary artery without angina pectoris: Secondary | ICD-10-CM | POA: Diagnosis not present

## 2020-11-21 DIAGNOSIS — R6881 Early satiety: Secondary | ICD-10-CM

## 2020-11-21 DIAGNOSIS — R140 Abdominal distension (gaseous): Secondary | ICD-10-CM | POA: Diagnosis not present

## 2020-11-21 NOTE — Patient Instructions (Signed)
Small physician will call with results of blood test and gastric emptying study when completed. Please keep symptom diary.  Keep a record of the times when he have regurgitation and or vomiting.

## 2020-11-21 NOTE — Progress Notes (Signed)
Presenting complaint;  Postprandial fullness and regurgitation.  Database and subjective:  Patient is 75 year old Caucasian female who called last week and requested to be seen.  She has a history of GERD maintained on PPI who also has been struggling with dyspepsia for the past few years.  She was found to have cholelithiasis and underwent laparoscopic cholecystectomy 1 year ago. Patient now presents with postprandial bloating early satiety.  She says she can only eat half of her meal.  In the last 2 weeks she has had 2 episodes of regurgitation when she vomited the food that she had eaten at meal.  There was no heaving or nausea.  She feels as if food stays in stomach for long and never moves.  She has had the symptoms for about 2 months. She had left hip surgery on 09/19/2020.  She says she took pain medication only for few days.  She did not have any untoward effects with pain medication. She has sporadic heartburn.  She feels dexlansoprazole worked in the past but her insurance would not cover it. She also complains of frequent burping.  She denies dysphagia hematemesis melena or rectal bleeding.  Her bowels move daily.  She is watching her diet.  She has 1 cup of coffee in the morning which also helps with defecation.  She drinks water and may have ice tea couple of times in a week but not every day. She has finished physical therapy.  She has been advised to walk 30 minutes a day.  She says she is not quite there yet.  She does not have any rest pain in her left hip. She does not take any NSAIDs other than low-dose aspirin. She has gained 4 pounds since her last visit of October 2021.  She had a esophagogastroduodenoscopy in October 2021 revealing small sliding hiatal hernia otherwise normal examination.  Her last colonoscopy was in February this year revealing single small tubular adenoma.  Current Medications: Outpatient Encounter Medications as of 11/21/2020  Medication Sig   aspirin EC  81 MG tablet Take 1 tablet (81 mg total) by mouth at bedtime.   diphenhydrAMINE HCl, Sleep, (UNISOM SLEEPGELS) 50 MG CAPS Take 50 mg by mouth at bedtime.   esomeprazole (NEXIUM) 40 MG capsule Take 1 capsule (40 mg total) by mouth in the morning.   famotidine-calcium carbonate-magnesium hydroxide (PEPCID COMPLETE) 10-800-165 MG chewable tablet Chew 1 tablet by mouth daily as needed (acid reflux).   rosuvastatin (CRESTOR) 40 MG tablet TAKE 1 TABLET BY MOUTH ONCE A DAY.   [DISCONTINUED] fluticasone (FLONASE) 50 MCG/ACT nasal spray Place 2 sprays into both nostrils daily.   dexlansoprazole (DEXILANT) 60 MG capsule Take 1 capsule (60 mg total) by mouth daily. (Patient not taking: Reported on 11/21/2020)   No facility-administered encounter medications on file as of 11/21/2020.     Objective: Blood pressure (!) 143/83, pulse 84, temperature 98.5 F (36.9 C), temperature source Oral, height _0  (1.676 m), weight 160 lb 12.8 oz (72.9 kg). Patient is alert and in no acute distress. Conjunctiva is pink. Sclera is nonicteric Oropharyngeal mucosa is normal. Dentition in satisfactory condition. No neck masses or thyromegaly noted. Cardiac exam with regular rhythm normal S1 and S2. No murmur or gallop noted. Lungs are clear to auscultation. Abdomen is symmetrical.  Bowel sounds are normal.  No succussion splash noted over epigastric region.  On palpation abdomen is soft and nontender without organomegaly or masses. No LE edema or clubbing noted.  Labs/studies Results:  CBC Latest Ref Rng & Units 10/16/2020 10/26/2019 11/17/2018  WBC 4.0 - 10.5 K/uL 5.3 6.1 5.1  Hemoglobin 12.0 - 15.0 g/dL 11.9(L) 13.8 13.9  Hematocrit 36.0 - 46.0 % 37.2 40.8 41.7  Platelets 150 - 400 K/uL 339 312 307    CMP Latest Ref Rng & Units 11/15/2020 10/16/2020 10/26/2019  Glucose 70 - 99 mg/dL 95 120(H) 84  BUN 8 - 23 mg/dL _0 Creatinine 0.44 - 1.00 mg/dL 0.96 0.95 1.31(H)  Sodium 135 - 145 mmol/L 139 139 140   Potassium 3.5 - 5.1 mmol/L 3.9 3.8 4.1  Chloride 98 - 111 mmol/L 107 108 105  CO2 22 - 32 mmol/L _1 Calcium 8.9 - 10.3 mg/dL 9.7 9.6 10.3  Total Protein 6.1 - 8.1 g/dL - - 7.0  Total Bilirubin 0.2 - 1.2 mg/dL - - 0.3  Alkaline Phos 39 - 117 U/L - - -  AST 10 - 35 U/L - - 21  ALT 6 - 29 U/L - - 18    Hepatic Function Latest Ref Rng & Units 10/26/2019 07/27/2012  Total Protein 6.1 - 8.1 g/dL 7.0 7.2  Albumin 3.5 - 5.2 g/dL - 3.7  AST 10 - 35 U/L 21 24  ALT 6 - 29 U/L 18 23  Alk Phosphatase 39 - 117 U/L - 83  Total Bilirubin 0.2 - 1.2 mg/dL 0.3 0.3    Recent lab data pertinent for hemoglobin of 11.9.  Assessment:  #1.  Postprandial fullness, regurgitation and early satiety.  She had EGD 13 months ago prior to laparoscopic cholecystectomy and she did not have gastritis or peptic ulcer disease.  Her symptoms are suggestive of gastroparesis or she could have chronic dyspepsia.  #2.  Chronic GERD.  Symptom control is reasonable but dexlansoprazole works the best but its not covered by her insurance.  #3.  Recent lab data pertinent for mild anemia.  Plan:  CBC and comprehensive chemistry panel. Solid-phase gastric emptying study. Patient advised to keep symptom diary. Further recommendations to follow.

## 2020-11-21 NOTE — Progress Notes (Signed)
.  nrso

## 2020-11-22 ENCOUNTER — Ambulatory Visit (HOSPITAL_BASED_OUTPATIENT_CLINIC_OR_DEPARTMENT_OTHER)
Admission: RE | Admit: 2020-11-22 | Discharge: 2020-11-22 | Disposition: A | Discharge status: Home / Self Care | Payer: Medicare Other | Source: Ambulatory Visit | Attending: Internal Medicine | Admitting: Internal Medicine

## 2020-11-22 DIAGNOSIS — R0602 Shortness of breath: Secondary | ICD-10-CM | POA: Diagnosis not present

## 2020-11-22 MED ORDER — IOHEXOL 350 MG/ML SOLN
100.0000 mL | Freq: Once | INTRAVENOUS | Status: AC | PRN
  Administered 2020-11-22: 61 mL via INTRAVENOUS

## 2020-11-26 LAB — COMPREHENSIVE METABOLIC PANEL
AG Ratio: 1.4 (calc) (ref 1.0–2.5)
ALT: 15 U/L (ref 6–29)
AST: 19 U/L (ref 10–35)
Albumin: 3.9 g/dL (ref 3.6–5.1)
Alkaline phosphatase (APISO): 78 U/L (ref 37–153)
BUN: 15 mg/dL (ref 7–25)
CO2: 22 mmol/L (ref 20–32)
Calcium: 10 mg/dL (ref 8.6–10.4)
Chloride: 109 mmol/L (ref 98–110)
Creat: 0.93 mg/dL (ref 0.60–1.00)
Globulin: 2.7 g/dL (calc) (ref 1.9–3.7)
Glucose, Bld: 118 mg/dL (ref 65–139)
Potassium: 4.3 mmol/L (ref 3.5–5.3)
Sodium: 141 mmol/L (ref 135–146)
Total Bilirubin: 0.3 mg/dL (ref 0.2–1.2)
Total Protein: 6.6 g/dL (ref 6.1–8.1)

## 2020-11-26 LAB — CBC
HCT: 39.4 % (ref 35.0–45.0)
Hemoglobin: 12.9 g/dL (ref 11.7–15.5)
MCH: 27.7 pg (ref 27.0–33.0)
MCHC: 32.7 g/dL (ref 32.0–36.0)
MCV: 84.5 fL (ref 80.0–100.0)
MPV: 10 fL (ref 7.5–12.5)
Platelets: 289 10*3/uL (ref 140–400)
RBC: 4.66 10*6/uL (ref 3.80–5.10)
RDW: 13 % (ref 11.0–15.0)
WBC: 5.3 10*3/uL (ref 3.8–10.8)

## 2020-11-26 NOTE — Progress Notes (Signed)
Can patient be set up for pulmonary rehab?   Dx:  Dyspnea with exertion

## 2020-12-02 ENCOUNTER — Encounter (HOSPITAL_COMMUNITY)
Admission: RE | Admit: 2020-12-02 | Discharge: 2020-12-02 | Disposition: A | Discharge status: Home / Self Care | Payer: Medicare Other | Source: Ambulatory Visit | Attending: Internal Medicine | Admitting: Internal Medicine

## 2020-12-02 ENCOUNTER — Encounter (HOSPITAL_COMMUNITY): Payer: Self-pay

## 2020-12-02 ENCOUNTER — Other Ambulatory Visit: Payer: Self-pay

## 2020-12-02 DIAGNOSIS — R140 Abdominal distension (gaseous): Secondary | ICD-10-CM | POA: Insufficient documentation

## 2020-12-02 DIAGNOSIS — R6881 Early satiety: Secondary | ICD-10-CM | POA: Insufficient documentation

## 2020-12-02 MED ORDER — TECHNETIUM TC 99M SULFUR COLLOID
2.0000 | Freq: Once | INTRAVENOUS | Status: AC | PRN
  Administered 2020-12-02: 2.2 via ORAL

## 2020-12-04 ENCOUNTER — Other Ambulatory Visit (INDEPENDENT_AMBULATORY_CARE_PROVIDER_SITE_OTHER): Payer: Self-pay | Admitting: Internal Medicine

## 2020-12-04 ENCOUNTER — Telehealth: Payer: Self-pay | Admitting: *Deleted

## 2020-12-04 ENCOUNTER — Telehealth (INDEPENDENT_AMBULATORY_CARE_PROVIDER_SITE_OTHER): Payer: Self-pay | Admitting: *Deleted

## 2020-12-04 MED ORDER — METOCLOPRAMIDE HCL 5 MG PO TABS
5.0000 mg | ORAL_TABLET | Freq: Two times a day (BID) | ORAL | 1 refills | Status: DC
Start: 2020-12-04 — End: 2020-12-10

## 2020-12-04 NOTE — Telephone Encounter (Signed)
Per Dr. Laural Golden - Please make an appointment for office visit in 6 to 8 weeks with me

## 2020-12-04 NOTE — Telephone Encounter (Signed)
Error

## 2020-12-10 ENCOUNTER — Other Ambulatory Visit (INDEPENDENT_AMBULATORY_CARE_PROVIDER_SITE_OTHER): Payer: Self-pay | Admitting: *Deleted

## 2020-12-10 ENCOUNTER — Telehealth (INDEPENDENT_AMBULATORY_CARE_PROVIDER_SITE_OTHER): Payer: Self-pay | Admitting: *Deleted

## 2020-12-10 MED ORDER — METOCLOPRAMIDE HCL 5 MG PO TABS: 5.0000 mg | ORAL_TABLET | Freq: Two times a day (BID) | ORAL | 0 refills | Status: DC

## 2020-12-10 NOTE — Telephone Encounter (Signed)
Pt walked in office. States metoclopramide 5mg  rx directions are twice daily but only #30 tablets with one refill was sent in on 12/04/20. She would like rx changed to enough for a 30 day supply. [place] [facility]. Med quantity changed so pt would not run out of med early.

## 2021-02-06 ENCOUNTER — Encounter (INDEPENDENT_AMBULATORY_CARE_PROVIDER_SITE_OTHER): Payer: Self-pay | Admitting: Internal Medicine

## 2021-02-06 ENCOUNTER — Other Ambulatory Visit: Payer: Self-pay

## 2021-02-06 ENCOUNTER — Ambulatory Visit (INDEPENDENT_AMBULATORY_CARE_PROVIDER_SITE_OTHER): Payer: BC Managed Care – PPO | Admitting: Internal Medicine

## 2021-02-06 VITALS — BP 134/76 | HR 71 | Temp 97.9°F | Ht 66.0 in | Wt 155.8 lb

## 2021-02-06 DIAGNOSIS — K3184 Gastroparesis: Secondary | ICD-10-CM | POA: Insufficient documentation

## 2021-02-06 DIAGNOSIS — K219 Gastro-esophageal reflux disease without esophagitis: Secondary | ICD-10-CM

## 2021-02-06 MED ORDER — METOCLOPRAMIDE HCL 5 MG PO TABS: 5.0000 mg | ORAL_TABLET | Freq: Two times a day (BID) | ORAL | 0 refills | Status: DC | PRN

## 2021-02-06 MED ORDER — DEXLANSOPRAZOLE 60 MG PO CPDR
60.0000 mg | DELAYED_RELEASE_CAPSULE | Freq: Every day | ORAL | 11 refills | Status: DC
Start: 2021-02-06 — End: 2022-02-12

## 2021-02-06 NOTE — Progress Notes (Signed)
Presenting complaint;  Follow-up for GERD and gastroparesis.  Database and subjective:  Patient is 76 year old Caucasian female who is here for scheduled visit.  She has several year history of GERD maintained on PPI.  In fall 2021 she developed postprandial bloating early satiety and had few episodes of regurgitation and or vomiting.  She underwent esophagogastroduodenoscopy in October 2021 which revealed small sliding hiatal hernia.  I felt symptoms are secondary to cholelithiasis.  She underwent laparoscopic cholecystectomy by Dr. Constance Haw in November 2021. She began to have frequent burping despite watching her diet.  Symptoms flared up after she had left hip surgery in September 2022.  She reported dexlansoprazole control her heartburn better than other medications. She had gastric emptying study in November last year confirming gastroparesis.  She was begun on low-dose metoclopramide.  She was doing well when she was last seen on 11/21/2020.  She was switched to esomeprazole but heartburn was not well controlled.  She has gone back to dexlansoprazole which is working very well.  She says she has had to 3 episodes of regurgitation/vomiting since her last visit.  She realizes this occurs if she eats too much.  She is therefore eating less.  She feels as a reason she has lost 5 pounds.  She is states she had daily heartburn while on esomeprazole.  This has not been an issue since she has been on dexlansoprazole.  She is taking metoclopramide 5 mg twice daily.  She is not have any tremors or other side effects.  She says at times she feels prickly over abdomen.  Bowels generally move daily.  Every now and then she becomes constipated and may take MiraLAX 2 to 3 days in a row and then may not need it for a month or longer.  She has not fully recovered from left hip surgery but she is trying to walk.  Other day she walks 6 blocks.  Current Medications: Outpatient Encounter Medications as of 02/06/2021   Medication Sig   aspirin EC 81 MG tablet Take 1 tablet (81 mg total) by mouth at bedtime.   dexlansoprazole (DEXILANT) 60 MG capsule Take 1 capsule (60 mg total) by mouth daily.   diphenhydrAMINE HCl, Sleep, (UNISOM SLEEPGELS) 50 MG CAPS Take 50 mg by mouth at bedtime.   fluticasone (FLONASE) 50 MCG/ACT nasal spray Place 2 sprays into both nostrils daily.   levocetirizine (XYZAL) 5 MG tablet Take 5 mg by mouth every evening.   metoCLOPramide (REGLAN) 5 MG tablet Take 1 tablet (5 mg total) by mouth 2 (two) times daily before a meal.   Polyethyl Glycol-Propyl Glycol (SYSTANE OP) Apply to eye. One drop each eye twice daily   rosuvastatin (CRESTOR) 40 MG tablet TAKE 1 TABLET BY MOUTH ONCE A DAY.   [DISCONTINUED] esomeprazole (NEXIUM) 40 MG capsule Take 1 capsule (40 mg total) by mouth in the morning.   [DISCONTINUED] famotidine-calcium carbonate-magnesium hydroxide (PEPCID COMPLETE) 10-800-165 MG chewable tablet Chew 1 tablet by mouth daily as needed (acid reflux).   No facility-administered encounter medications on file as of 02/06/2021.     Objective: Blood pressure 134/76, pulse 71, temperature 97.9 F (36.6 C), temperature source Oral, height _0  (1.676 m), weight 155 lb 12.8 oz (70.7 kg). Patient is alert and in no acute distress. She does not have extremity tremors or abnormal movements of lips or tongue. Conjunctiva is pink. Sclera is nonicteric Oropharyngeal mucosa is normal. No neck masses or thyromegaly noted. Cardiac exam with regular rhythm normal S1 and  S2. No murmur or gallop noted. Lungs are clear to auscultation. Abdomen is symmetrical soft and nontender with organomegaly or masses. No LE edema or clubbing noted.  Labs/studies Results:   CBC Latest Ref Rng & Units 11/25/2020 10/16/2020 10/26/2019  WBC 3.8 - 10.8 Thousand/uL 5.3 5.3 6.1  Hemoglobin 11.7 - 15.5 g/dL 12.9 11.9(L) 13.8  Hematocrit 35.0 - 45.0 % 39.4 37.2 40.8  Platelets 140 - 400 Thousand/uL 289 339 312     CMP Latest Ref Rng & Units 11/25/2020 11/15/2020 10/16/2020  Glucose 65 - 139 mg/dL 118 95 120(H)  BUN 7 - 25 mg/dL _0 Creatinine 0.60 - 1.00 mg/dL 0.93 0.96 0.95  Sodium 135 - 146 mmol/L 141 139 139  Potassium 3.5 - 5.3 mmol/L 4.3 3.9 3.8  Chloride 98 - 110 mmol/L 109 107 108  CO2 20 - 32 mmol/L _1 Calcium 8.6 - 10.4 mg/dL 10.0 9.7 9.6  Total Protein 6.1 - 8.1 g/dL 6.6 - -  Total Bilirubin 0.2 - 1.2 mg/dL 0.3 - -  Alkaline Phos 39 - 117 U/L - - -  AST 10 - 35 U/L 19 - -  ALT 6 - 29 U/L 15 - -    Hepatic Function Latest Ref Rng & Units 11/25/2020 10/26/2019 07/27/2012  Total Protein 6.1 - 8.1 g/dL 6.6 7.0 7.2  Albumin 3.5 - 5.2 g/dL - - 3.7  AST 10 - 35 U/L _2 ALT 6 - 29 U/L _3 Alk Phosphatase 39 - 117 U/L - - 83  Total Bilirubin 0.2 - 1.2 mg/dL 0.3 0.3 0.3    Above lab data reviewed.  Assessment:  #1.  Chronic GERD.  She is doing well with antireflux measures and dexlansoprazole but her co-pay is more than $60 per month.  She has tried pantoprazole esomeprazole and omeprazole and these medications have not worked well in the past.  We will reach out to her insurance if it could be approved at a lower cost.  #2.  Gastroparesis.  She has mild disease based on study last year.  She appears to be doing well with small meals and very low-dose metoclopramide.  I would like for her to drop dose further to just 5 mg daily.  She can also use it on as-needed basis.   Plan:  Decrease metoclopramide dose to 5 mg once a day.  She can take it either before breakfast or evening meal.  She can use second dose on as-needed basis. New prescription sent to her pharmacy for 3 months supply without refills. Office visit in 4 months.

## 2021-02-06 NOTE — Patient Instructions (Signed)
Try metoclopramide once a day.  You can take it before evening meals or before breakfast and see which time it works the best. Can take 2 doses on as-needed basis.

## 2021-02-13 ENCOUNTER — Telehealth (INDEPENDENT_AMBULATORY_CARE_PROVIDER_SITE_OTHER): Payer: Self-pay | Admitting: *Deleted

## 2021-02-13 NOTE — Telephone Encounter (Signed)
Patient dropped off form from [place] stating deslansoprazole 60mg  needs Prior auth. I submitted information through cover my meds.   PA Case: [id number], Status: Approved, Coverage Starts on: 01/12/2021 12:00:00 AM, Coverage Ends on: 01/11/2022 12:00:00 AM.  Pt called and notified med was approved through 01/11/22 and to call if any problems with med.

## 2021-03-05 NOTE — Telephone Encounter (Signed)
error 

## 2021-06-05 ENCOUNTER — Encounter (INDEPENDENT_AMBULATORY_CARE_PROVIDER_SITE_OTHER): Payer: Self-pay | Admitting: Internal Medicine

## 2021-06-05 ENCOUNTER — Ambulatory Visit (INDEPENDENT_AMBULATORY_CARE_PROVIDER_SITE_OTHER): Payer: Medicare PPO | Admitting: Internal Medicine

## 2021-06-05 VITALS — BP 120/74 | HR 71 | Temp 98.3°F | Ht 66.0 in | Wt 154.2 lb

## 2021-06-05 DIAGNOSIS — K219 Gastro-esophageal reflux disease without esophagitis: Secondary | ICD-10-CM | POA: Diagnosis not present

## 2021-06-05 DIAGNOSIS — K3184 Gastroparesis: Secondary | ICD-10-CM

## 2021-06-05 NOTE — Patient Instructions (Signed)
Notify if dexlansoprazole stops working or if you have side effects with metoclopramide.

## 2021-06-05 NOTE — Progress Notes (Signed)
Presenting complaint;  Follow-up for GERD and gastroparesis.  Database and subjective:  Patient is 76 year old Caucasian female who was chronic GERD and gastroparesis visit for scheduled visit.  She was last seen on 02/06/2021. At the time of her last visit she was on metoclopramide 5 mg twice daily and dexlansoprazole 60 mg daily.  She has been on esomeprazole prior to that but she was having daily heartburn.  Patient was advised to take 5 mg metoclopramide every morning and use second dose on as-needed basis.  Patient states she is doing much better.  She eats less and she never eats late.  She has not had any episode of regurgitation or vomiting since her last visit.  She had been having these spells before that.  He states she may take a second dose of metoclopramide 3-4 times in a month.  She is not having any side effects of tremors.  She has chronic insomnia for which she is taking Unisom and it does help her sleep.  Her bowels move well most days.  She is using polyethylene glycol no more than twice a month when she feels she is constipated.  She denies melena or rectal bleeding. Her weight is down about 1 pound since her last visit. She states 1 month of dexlansoprazole costs are $64.  Other PPIs have not worked.  Current Medications: Outpatient Encounter Medications as of 06/05/2021  Medication Sig   aspirin EC 81 MG tablet Take 1 tablet (81 mg total) by mouth at bedtime.   dexlansoprazole (DEXILANT) 60 MG capsule Take 1 capsule (60 mg total) by mouth daily.   diphenhydrAMINE HCl, Sleep, (UNISOM SLEEPGELS) 50 MG CAPS Take 50 mg by mouth at bedtime.   fluticasone (FLONASE) 50 MCG/ACT nasal spray Place 2 sprays into both nostrils daily.   guaiFENesin (MUCINEX PO) Take by mouth. Takes as needed.   metoCLOPramide (REGLAN) 5 MG tablet Take 1 tablet (5 mg total) by mouth 2 (two) times daily as needed for nausea.   Polyethyl Glycol-Propyl Glycol (SYSTANE OP) Apply to eye. One drop each eye  twice daily   rosuvastatin (CRESTOR) 40 MG tablet TAKE 1 TABLET BY MOUTH ONCE A DAY.   [DISCONTINUED] levocetirizine (XYZAL) 5 MG tablet Take 5 mg by mouth every evening.   No facility-administered encounter medications on file as of 06/05/2021.     Objective: Blood pressure 120/74, pulse 71, temperature 98.3 F (36.8 C), temperature source Oral, height 5' 6"  (1.676 m), weight 154 lb 3.2 oz (69.9 kg). Patient is alert and in no acute distress. Conjunctiva is pink. Sclera is nonicteric Oropharyngeal mucosa is normal. No abnormal movement noted to tongue or lips. No neck masses or thyromegaly noted. Cardiac exam with regular rhythm normal S1 and S2. No murmur or gallop noted. Lungs are clear to auscultation. Abdomen symmetrical soft and nontender with organomegaly or masses.  No succussion splash noted over epigastric region. No LE edema or clubbing noted.  Labs/studies Results:      Latest Ref Rng & Units 11/25/2020   10:28 AM 10/16/2020    2:38 PM 10/26/2019    3:11 PM  CBC  WBC 3.8 - 10.8 Thousand/uL 5.3   5.3   6.1    Hemoglobin 11.7 - 15.5 g/dL 12.9   11.9   13.8    Hematocrit 35.0 - 45.0 % 39.4   37.2   40.8    Platelets 140 - 400 Thousand/uL 289   339   312  Latest Ref Rng & Units 11/25/2020   10:28 AM 11/15/2020    3:01 PM 10/16/2020    2:38 PM  CMP  Glucose 65 - 139 mg/dL 118   95   120    BUN 7 - 25 mg/dL 15   12   15     Creatinine 0.60 - 1.00 mg/dL 0.93   0.96   0.95    Sodium 135 - 146 mmol/L 141   139   139    Potassium 3.5 - 5.3 mmol/L 4.3   3.9   3.8    Chloride 98 - 110 mmol/L 109   107   108    CO2 20 - 32 mmol/L 22   28   24     Calcium 8.6 - 10.4 mg/dL 10.0   9.7   9.6    Total Protein 6.1 - 8.1 g/dL 6.6      Total Bilirubin 0.2 - 1.2 mg/dL 0.3      AST 10 - 35 U/L 19      ALT 6 - 29 U/L 15           Latest Ref Rng & Units 11/25/2020   10:28 AM 10/26/2019    3:11 PM 07/27/2012    3:36 PM  Hepatic Function  Total Protein 6.1 - 8.1 g/dL 6.6    7.0   7.2    Albumin 3.5 - 5.2 g/dL   3.7    AST 10 - 35 U/L 19   21   24     ALT 6 - 29 U/L 15   18   23     Alk Phosphatase 39 - 117 U/L   83    Total Bilirubin 0.2 - 1.2 mg/dL 0.3   0.3   0.3       Assessment:  #1.  GERD/gastroparesis.  She is doing well with dietary measures PPI and low-dose metoclopramide.  She is not having any side effects.  Will not make any changes given that she is doing well.  #2.  Patient is up-to-date regarding screening for colon cancer.  She had colonoscopy in February last year revealing 2 mm tubular adenoma.  No recommendations at this time but this can be discussed down the road whether or not he should have any more exams.  Plan:  Patient will continue current therapy. Patient advised to call office if dexlansoprazole stops working or if she has any side effects with metoclopramide. Office visit in 6 months with Ms. Scherrie Gerlach, NP.

## 2021-06-17 DIAGNOSIS — M5416 Radiculopathy, lumbar region: Secondary | ICD-10-CM | POA: Diagnosis not present

## 2021-06-23 ENCOUNTER — Other Ambulatory Visit (INDEPENDENT_AMBULATORY_CARE_PROVIDER_SITE_OTHER): Payer: Self-pay | Admitting: Internal Medicine

## 2021-07-02 ENCOUNTER — Other Ambulatory Visit: Payer: Self-pay | Admitting: Physical Medicine & Rehabilitation

## 2021-07-02 ENCOUNTER — Other Ambulatory Visit (HOSPITAL_COMMUNITY): Payer: Self-pay | Admitting: Physical Medicine & Rehabilitation

## 2021-07-02 DIAGNOSIS — M5416 Radiculopathy, lumbar region: Secondary | ICD-10-CM | POA: Diagnosis not present

## 2021-07-02 DIAGNOSIS — M25552 Pain in left hip: Secondary | ICD-10-CM

## 2021-07-17 ENCOUNTER — Encounter (HOSPITAL_COMMUNITY)
Admission: RE | Admit: 2021-07-17 | Discharge: 2021-07-17 | Disposition: A | Discharge status: Home / Self Care | Payer: Medicare PPO | Source: Ambulatory Visit | Attending: Physical Medicine & Rehabilitation | Admitting: Physical Medicine & Rehabilitation

## 2021-07-17 DIAGNOSIS — M25552 Pain in left hip: Secondary | ICD-10-CM | POA: Diagnosis not present

## 2021-07-17 DIAGNOSIS — Z471 Aftercare following joint replacement surgery: Secondary | ICD-10-CM | POA: Diagnosis not present

## 2021-07-17 DIAGNOSIS — Z96642 Presence of left artificial hip joint: Secondary | ICD-10-CM | POA: Diagnosis not present

## 2021-07-17 MED ORDER — TECHNETIUM TC 99M MEDRONATE IV KIT
20.0000 | PACK | Freq: Once | INTRAVENOUS | Status: AC | PRN
  Administered 2021-07-17: 20 via INTRAVENOUS

## 2021-07-23 DIAGNOSIS — M5416 Radiculopathy, lumbar region: Secondary | ICD-10-CM | POA: Diagnosis not present

## 2021-08-14 DIAGNOSIS — M5416 Radiculopathy, lumbar region: Secondary | ICD-10-CM | POA: Diagnosis not present

## 2021-08-20 ENCOUNTER — Other Ambulatory Visit (HOSPITAL_COMMUNITY): Payer: Self-pay | Admitting: Internal Medicine

## 2021-08-20 DIAGNOSIS — M5416 Radiculopathy, lumbar region: Secondary | ICD-10-CM | POA: Diagnosis not present

## 2021-08-20 DIAGNOSIS — Z1231 Encounter for screening mammogram for malignant neoplasm of breast: Secondary | ICD-10-CM

## 2021-08-20 DIAGNOSIS — M545 Low back pain, unspecified: Secondary | ICD-10-CM | POA: Diagnosis not present

## 2021-09-04 DIAGNOSIS — M25552 Pain in left hip: Secondary | ICD-10-CM | POA: Diagnosis not present

## 2021-09-04 DIAGNOSIS — M5416 Radiculopathy, lumbar region: Secondary | ICD-10-CM | POA: Diagnosis not present

## 2021-09-10 ENCOUNTER — Other Ambulatory Visit: Payer: Self-pay | Admitting: Student

## 2021-09-10 DIAGNOSIS — M25552 Pain in left hip: Secondary | ICD-10-CM

## 2021-09-22 ENCOUNTER — Ambulatory Visit (HOSPITAL_COMMUNITY)
Admission: RE | Admit: 2021-09-22 | Discharge: 2021-09-22 | Disposition: A | Discharge status: Home / Self Care | Payer: Medicare PPO | Source: Ambulatory Visit | Attending: Internal Medicine | Admitting: Internal Medicine

## 2021-09-22 DIAGNOSIS — Z1231 Encounter for screening mammogram for malignant neoplasm of breast: Secondary | ICD-10-CM | POA: Diagnosis not present

## 2021-09-22 DIAGNOSIS — X32XXXD Exposure to sunlight, subsequent encounter: Secondary | ICD-10-CM | POA: Diagnosis not present

## 2021-09-22 DIAGNOSIS — L570 Actinic keratosis: Secondary | ICD-10-CM | POA: Diagnosis not present

## 2021-09-23 ENCOUNTER — Other Ambulatory Visit: Payer: Self-pay | Admitting: Internal Medicine

## 2021-09-27 ENCOUNTER — Ambulatory Visit
Admission: RE | Admit: 2021-09-27 | Discharge: 2021-09-27 | Disposition: A | Discharge status: Home / Self Care | Payer: Medicare PPO | Source: Ambulatory Visit | Attending: Student | Admitting: Student

## 2021-09-27 DIAGNOSIS — M25552 Pain in left hip: Secondary | ICD-10-CM | POA: Diagnosis not present

## 2021-09-30 NOTE — Progress Notes (Unsigned)
Cardiology Office Note   Date:  10/01/2021   ID:  Laurie Bridges, DOB 14-Dec-1945, MRN [id number]  PCP:  Asencion Noble, MD  Cardiologist:   Dorris Carnes, MD   Pt  presents for follow up of CAD     History of Present Illness: Laurie Bridges is a 76 y.o. female with a history of CAD (s/p coronary CT angio in June 2020 showing mild narrowing in mid,LAD, mid LCx and mid RCA), HTN, MR  She was last seen by B Gleaves in Aug 2021    I saw the pt in March 2022   The pt says she notices her HR jumps  up and down, up when active   Gets SOB with activity     If walks from house to mailbox and back, heart will be racing and she will be SOB      She says she went to the [place] this summer   Was very warm   She was drinkng a lot of water   Said she was sitting on the beach and her HR was in the 130s     Denies dizziness  Still having issues with hip   Seen by Percell Miller and neurosurgery   Hip limits her activity    Current Meds  Medication Sig   aspirin EC 81 MG tablet Take 1 tablet (81 mg total) by mouth at bedtime.   cetirizine (ZYRTEC) 10 MG tablet Take 10 mg by mouth daily.   dexlansoprazole (DEXILANT) 60 MG capsule Take 1 capsule (60 mg total) by mouth daily.   diphenhydrAMINE HCl, Sleep, (UNISOM SLEEPGELS) 50 MG CAPS Take 50 mg by mouth at bedtime.   fluticasone (FLONASE) 50 MCG/ACT nasal spray Place 2 sprays into both nostrils daily.   guaiFENesin (MUCINEX PO) Take by mouth. Takes as needed.   metoCLOPramide (REGLAN) 5 MG tablet TAKE 1 TABLET BY MOUTH TWICE DAILY BEFORE MEALS.   Polyethyl Glycol-Propyl Glycol (SYSTANE OP) Apply to eye. One drop each eye twice daily   rosuvastatin (CRESTOR) 40 MG tablet Take 1 tablet (40 mg total) by mouth daily. Keep upcoming appointment for future refills. Thank you.     Allergies:   Codeine, Demerol, Elemental sulfur, Feldene [piroxicam], Latex, and Pyridium [phenazopyridine hcl]   Past Medical History:  Diagnosis Date   Anemia    hx of Fe  def many years ago   Anxiety    Arthritis    back   CAD (coronary artery disease)    a. s/p Coronary CT in 06/2018 showing mild stenosis along the mid-LAD, mid-LCx and mid-RCA   GERD (gastroesophageal reflux disease)    Hypertension    PONV (postoperative nausea and vomiting)     Past Surgical History:  Procedure Laterality Date   ABDOMINAL HYSTERECTOMY     partial hysterectomy, ovaries remain   BACK SURGERY     CHOLECYSTECTOMY N/A 11/27/2019   Procedure: LAPAROSCOPIC CHOLECYSTECTOMY;  Surgeon: Virl Cagey, MD;  Location: [facility];  Service: General;  Laterality: N/A;   COLONOSCOPY N/A 02/29/2020   Procedure: COLONOSCOPY;  Surgeon: Rogene Houston, MD;  Location: [facility];  Service: Endoscopy;  Laterality: N/A;  8:45   ESOPHAGEAL DILATION N/A 11/17/2017   Procedure: ESOPHAGEAL DILATION;  Surgeon: Rogene Houston, MD;  Location: [facility];  Service: Endoscopy;  Laterality: N/A;   ESOPHAGOGASTRODUODENOSCOPY N/A 11/17/2017   Procedure: ESOPHAGOGASTRODUODENOSCOPY (EGD);  Surgeon: Rogene Houston, MD;  Location: [facility];  Service: Endoscopy;  Laterality:  N/A;  12:00   ESOPHAGOGASTRODUODENOSCOPY N/A 11/02/2019   Procedure: ESOPHAGOGASTRODUODENOSCOPY (EGD);  Surgeon: Rogene Houston, MD;  Location: [facility];  Service: Endoscopy;  Laterality: N/A;  945   FOOT SURGERY     POLYPECTOMY  02/29/2020   Procedure: POLYPECTOMY;  Surgeon: Rogene Houston, MD;  Location: [facility];  Service: Endoscopy;;     Social History:  The patient  reports that she has never smoked. She has been exposed to tobacco smoke. She has never used smokeless tobacco. She reports that she does not drink alcohol and does not use drugs.   Family History:  The patient's family history includes Cancer in her maternal uncle, paternal uncle, and paternal uncle; Stroke in her father and mother.    ROS:  Please see the history of present illness. All other systems are reviewed and  Negative to the  above problem except as noted.    PHYSICAL EXAM: VS:  BP 135/80   Pulse 66   Ht '5\' 6"'$  (1.676 m)   Wt 158 lb (71.7 kg)   SpO2 97%   BMI 25.50 kg/m   INitial BP 144/       GEN: Well nourished, well developed, in no acute distress  HEENT: normal  Neck: no JVD, carotid bruits Cardiac: RRR; no murmur. No LE edema  Respiratory:  Relatively clear     GI: soft, nontender, nondistended, + BS  No hepatomegaly  MS: no deformity Moving all extremities   Skin: warm and dry, no rash Neuro:  Strength and sensation are intact Psych: euthymic mood, full affect   EKG:  EKG is ordered today.  SR 66 bpm      Lipid Panel    Component Value Date/Time   CHOL 139 10/26/2019 1511   TRIG 97 10/26/2019 1511   HDL 61 10/26/2019 1511   CHOLHDL 2.3 10/26/2019 1511   LDLCALC 60 10/26/2019 1511      Wt Readings from Last 3 Encounters:  10/01/21 158 lb (71.7 kg)  06/05/21 154 lb 3.2 oz (69.9 kg)  02/06/21 155 lb 12.8 oz (70.7 kg)      ASSESSMENT AND PLAN:  1  Heart racing    I will set up with 2 wk monitor to document what her HR is doing     Stay hydrated    2  CAD   IMild CAD on CT scan in 2020    am not convinced of active angina   Willl evaluate with monitor findings     Note PFTs in past showed mod air trapping   May explain SOB    3  HL   Will recheck lipids     Keep on statin     4  HTN    Initial SBP 144  ON Recheck was 135  Follow    Current medicines are reviewed at length with the patient today.  The patient does not have concerns regarding medicines.  Signed, Dorris Carnes, MD  10/01/2021 8:01 PM    [facility] [address], [place], [place]  [address] Phone: [phone]; Fax: [phone]

## 2021-10-01 ENCOUNTER — Ambulatory Visit (INDEPENDENT_AMBULATORY_CARE_PROVIDER_SITE_OTHER): Payer: Medicare PPO

## 2021-10-01 ENCOUNTER — Encounter: Payer: Self-pay | Admitting: Internal Medicine

## 2021-10-01 ENCOUNTER — Ambulatory Visit: Payer: Medicare PPO | Attending: Internal Medicine | Admitting: Internal Medicine

## 2021-10-01 VITALS — BP 135/80 | HR 66 | Ht 66.0 in | Wt 158.0 lb

## 2021-10-01 DIAGNOSIS — M1612 Unilateral primary osteoarthritis, left hip: Secondary | ICD-10-CM | POA: Diagnosis not present

## 2021-10-01 DIAGNOSIS — R002 Palpitations: Secondary | ICD-10-CM | POA: Diagnosis not present

## 2021-10-01 NOTE — Patient Instructions (Addendum)
Medication Instructions:  = *If you need a refill on your cardiac medications before your next appointment, please call your pharmacy*   Lab Work: nmr and hgba1c   If you have labs (blood work) drawn today and your tests are completely normal, you will receive your results only by: [place] (if you have MyChart) OR A paper copy in the mail If you have any lab test that is abnormal or we need to change your treatment, we will call you to review the results.   Testing/Procedures: [place]- Long Term Monitor Instructions  Your physician has requested you wear a ZIO patch monitor for 14 days.  This is a single patch monitor. [employer] supplies one patch monitor per enrollment. Additional stickers are not available. Please do not apply patch if you will be having a Nuclear Stress Test,  Echocardiogram, Cardiac CT, MRI, or Chest Xray during the period you would be wearing the  monitor. The patch cannot be worn during these tests. You cannot remove and re-apply the  ZIO XT patch monitor.  Your ZIO patch monitor will be mailed 3 day USPS to your address on file. It may take 3-5 days  to receive your monitor after you have been enrolled.  Once you have received your monitor, please review the enclosed instructions. Your monitor  has already been registered assigning a specific monitor serial # to you.  Billing and Patient Assistance Program Information  We have supplied [employer] with any of your insurance information on file for billing purposes. [employer] offers a sliding scale Patient Assistance Program for patients that do not have  insurance, or whose insurance does not completely cover the cost of the ZIO monitor.  You must apply for the Patient Assistance Program to qualify for this discounted rate.  To apply, please call [employer] at [phone], select option 4, select option 2, ask to apply for  Patient Assistance Program. Theodore Demark will ask your household income, and how many people   are in your household. They will quote your out-of-pocket cost based on that information.  [employer] will also be able to set up a 58-month interest-free payment plan if needed.  Applying the monitor   Shave hair from upper left chest.  Hold abrader disc by orange tab. Rub abrader in 40 strokes over the upper left chest as  indicated in your monitor instructions.  Clean area with 4 enclosed alcohol pads. Let dry.  Apply patch as indicated in monitor instructions. Patch will be placed under collarbone on left  side of chest with arrow pointing upward.  Rub patch adhesive wings for 2 minutes. Remove white label marked "1". Remove the white  label marked "2". Rub patch adhesive wings for 2 additional minutes.  While looking in a mirror, press and release button in center of patch. A small green light will  flash 3-4 times. This will be your only indicator that the monitor has been turned on.  Do not shower for the first 24 hours. You may shower after the first 24 hours.  Press the button if you feel a symptom. You will hear a small click. Record Date, Time and  Symptom in the Patient Logbook.  When you are ready to remove the patch, follow instructions on the last 2 pages of Patient  Logbook. Stick patch monitor onto the last page of Patient Logbook.  Place Patient Logbook in the blue and white box. Use locking tab on box and tape box closed  securely. The blue and  white box has prepaid postage on it. Please place it in the mailbox as  soon as possible. Your physician should have your test results approximately 7 days after the  monitor has been mailed back to [facility].  Call [place] at [phone] if you have questions regarding  your ZIO XT patch monitor. Call them immediately if you see an orange light blinking on your  monitor.  If your monitor falls off in less than 4 days, contact our Monitor department at [phone].  If your monitor becomes loose or  falls off after 4 days call [employer] at [phone] for  suggestions on securing your monitor    Follow-Up: At [facility], you and your health needs are our priority.  As part of our continuing mission to provide you with exceptional heart care, we have created designated Provider Care Teams.  These Care Teams include your primary Cardiologist (physician) and Advanced Practice Providers (APPs -  Physician Assistants and Nurse Practitioners) who all work together to provide you with the care you need, when you need it.  We recommend signing up for the patient portal called "MyChart".  Sign up information is provided on this After Visit Summary.  MyChart is used to connect with patients for Virtual Visits (Telemedicine).  Patients are able to view lab/test results, encounter notes, upcoming appointments, etc.  Non-urgent messages can be sent to your provider as well.   To learn more about what you can do with MyChart, go to [email or web address].     Important Information About Sugar

## 2021-10-01 NOTE — Progress Notes (Unsigned)
Enrolled for [employer] to mail a ZIO XT long term holter monitor to the patients address on file.  

## 2021-10-02 LAB — HEMOGLOBIN A1C
Est. average glucose Bld gHb Est-mCnc: 123 mg/dL
Hgb A1c MFr Bld: 5.9 % — ABNORMAL HIGH (ref 4.8–5.6)

## 2021-10-02 LAB — NMR, LIPOPROFILE
Cholesterol, Total: 159 mg/dL (ref 100–199)
HDL Particle Number: 35 umol/L (ref >=30.5)
HDL-C: 69 mg/dL (ref >39)
LDL Particle Number: 740 nmol/L (ref <1000)
LDL Size: 20.7 nm (ref >20.5)
LDL-C (NIH Calc): 69 mg/dL (ref 0–99)
LP-IR Score: <25 (ref <=45)
Small LDL Particle Number: 288 nmol/L (ref <=527)
Triglycerides: 121 mg/dL (ref 0–149)

## 2021-10-04 DIAGNOSIS — R002 Palpitations: Secondary | ICD-10-CM | POA: Diagnosis not present

## 2021-10-09 DIAGNOSIS — M25552 Pain in left hip: Secondary | ICD-10-CM | POA: Diagnosis not present

## 2021-10-09 DIAGNOSIS — M5416 Radiculopathy, lumbar region: Secondary | ICD-10-CM | POA: Diagnosis not present

## 2021-10-13 ENCOUNTER — Other Ambulatory Visit: Payer: Self-pay | Admitting: Student

## 2021-10-13 DIAGNOSIS — M5416 Radiculopathy, lumbar region: Secondary | ICD-10-CM

## 2021-10-20 ENCOUNTER — Ambulatory Visit
Admission: RE | Admit: 2021-10-20 | Discharge: 2021-10-20 | Disposition: A | Discharge status: Home / Self Care | Payer: Medicare PPO | Source: Ambulatory Visit | Attending: Student | Admitting: Student

## 2021-10-20 DIAGNOSIS — M5416 Radiculopathy, lumbar region: Secondary | ICD-10-CM

## 2021-10-20 DIAGNOSIS — M79605 Pain in left leg: Secondary | ICD-10-CM | POA: Diagnosis not present

## 2021-10-20 MED ORDER — METHYLPREDNISOLONE ACETATE 40 MG/ML INJ SUSP (RADIOLOG
80.0000 mg | Freq: Once | INTRAMUSCULAR | Status: AC
  Administered 2021-10-20: 80 mg via EPIDURAL

## 2021-10-20 MED ORDER — IOPAMIDOL (ISOVUE-M 200) INJECTION 41%
1.0000 mL | Freq: Once | INTRAMUSCULAR | Status: AC
  Administered 2021-10-20: 1 mL via EPIDURAL

## 2021-10-20 NOTE — Discharge Instructions (Signed)

## 2021-10-21 DIAGNOSIS — I2510 Atherosclerotic heart disease of native coronary artery without angina pectoris: Secondary | ICD-10-CM | POA: Diagnosis not present

## 2021-10-21 DIAGNOSIS — Z79899 Other long term (current) drug therapy: Secondary | ICD-10-CM | POA: Diagnosis not present

## 2021-10-21 DIAGNOSIS — K219 Gastro-esophageal reflux disease without esophagitis: Secondary | ICD-10-CM | POA: Diagnosis not present

## 2021-10-21 DIAGNOSIS — I10 Essential (primary) hypertension: Secondary | ICD-10-CM | POA: Diagnosis not present

## 2021-10-21 DIAGNOSIS — E785 Hyperlipidemia, unspecified: Secondary | ICD-10-CM | POA: Diagnosis not present

## 2021-10-22 DIAGNOSIS — R002 Palpitations: Secondary | ICD-10-CM | POA: Diagnosis not present

## 2021-11-03 DIAGNOSIS — M1612 Unilateral primary osteoarthritis, left hip: Secondary | ICD-10-CM | POA: Diagnosis not present

## 2021-11-05 DIAGNOSIS — Z23 Encounter for immunization: Secondary | ICD-10-CM | POA: Diagnosis not present

## 2021-11-05 DIAGNOSIS — N1831 Chronic kidney disease, stage 3a: Secondary | ICD-10-CM | POA: Diagnosis not present

## 2021-11-06 DIAGNOSIS — H04123 Dry eye syndrome of bilateral lacrimal glands: Secondary | ICD-10-CM | POA: Diagnosis not present

## 2021-11-12 ENCOUNTER — Encounter (INDEPENDENT_AMBULATORY_CARE_PROVIDER_SITE_OTHER): Payer: Self-pay | Admitting: Gastroenterology

## 2021-11-19 DIAGNOSIS — M1612 Unilateral primary osteoarthritis, left hip: Secondary | ICD-10-CM | POA: Diagnosis not present

## 2021-11-27 DIAGNOSIS — M5416 Radiculopathy, lumbar region: Secondary | ICD-10-CM | POA: Diagnosis not present

## 2021-12-06 ENCOUNTER — Ambulatory Visit
Admission: EM | Admit: 2021-12-06 | Discharge: 2021-12-06 | Disposition: A | Discharge status: Home / Self Care | Payer: Medicare PPO | Attending: Nurse Practitioner | Admitting: Nurse Practitioner

## 2021-12-06 ENCOUNTER — Encounter: Payer: Self-pay | Admitting: Emergency Medicine

## 2021-12-06 DIAGNOSIS — Z1152 Encounter for screening for COVID-19: Secondary | ICD-10-CM | POA: Diagnosis not present

## 2021-12-06 DIAGNOSIS — J069 Acute upper respiratory infection, unspecified: Secondary | ICD-10-CM | POA: Diagnosis not present

## 2021-12-06 LAB — SARS CORONAVIRUS 2 (TAT 6-24 HRS): SARS Coronavirus 2: NEGATIVE

## 2021-12-06 MED ORDER — PROMETHAZINE-DM 6.25-15 MG/5ML PO SYRP: 5.0000 mL | ORAL_SOLUTION | Freq: Every evening | ORAL | 0 refills | Status: DC | PRN

## 2021-12-06 NOTE — ED Provider Notes (Signed)
[facility]    CSN: [id number] Arrival date & time: 12/06/21  6295      History   Chief Complaint No chief complaint on file.   HPI Laurie Bridges is a 76 y.o. female.   The history is provided by the patient.   Patient presents with a 2-day history of nasal congestion, postnasal drainage, and headache.  She denies fever, chills, ear pain, ear drainage, wheezing, shortness of breath, difficulty breathing, or GI symptoms.  Patient states that she normally takes fluticasone and Zyrtec daily for allergies.  She states that her grandson was sick, her only sick contact.  She denies any other sick contacts.  Reports that she has a history of COVID in 2022.  Past Medical History:  Diagnosis Date   Anemia    hx of Fe def many years ago   Anxiety    Arthritis    back   CAD (coronary artery disease)    a. s/p Coronary CT in 06/2018 showing mild stenosis along the mid-LAD, mid-LCx and mid-RCA   GERD (gastroesophageal reflux disease)    Hypertension    PONV (postoperative nausea and vomiting)     Patient Active Problem List   Diagnosis Date Noted   Gastroparesis 02/06/2021   Postprandial bloating 11/21/2020   Early satiety 11/21/2020   Gallstones 11/17/2018   Gastroesophageal reflux disease without esophagitis 09/14/2017   Esophageal dysphagia 09/14/2017   Dyspnea 08/02/2013   GERD (gastroesophageal reflux disease) 08/02/2013    Past Surgical History:  Procedure Laterality Date   ABDOMINAL HYSTERECTOMY     partial hysterectomy, ovaries remain   BACK SURGERY     CHOLECYSTECTOMY N/A 11/27/2019   Procedure: LAPAROSCOPIC CHOLECYSTECTOMY;  Surgeon: Virl Cagey, MD;  Location: [facility];  Service: General;  Laterality: N/A;   COLONOSCOPY N/A 02/29/2020   Procedure: COLONOSCOPY;  Surgeon: Rogene Houston, MD;  Location: [facility];  Service: Endoscopy;  Laterality: N/A;  8:45   ESOPHAGEAL DILATION N/A 11/17/2017   Procedure: ESOPHAGEAL DILATION;  Surgeon:  Rogene Houston, MD;  Location: [facility];  Service: Endoscopy;  Laterality: N/A;   ESOPHAGOGASTRODUODENOSCOPY N/A 11/17/2017   Procedure: ESOPHAGOGASTRODUODENOSCOPY (EGD);  Surgeon: Rogene Houston, MD;  Location: [facility];  Service: Endoscopy;  Laterality: N/A;  12:00   ESOPHAGOGASTRODUODENOSCOPY N/A 11/02/2019   Procedure: ESOPHAGOGASTRODUODENOSCOPY (EGD);  Surgeon: Rogene Houston, MD;  Location: [facility];  Service: Endoscopy;  Laterality: N/A;  945   FOOT SURGERY     POLYPECTOMY  02/29/2020   Procedure: POLYPECTOMY;  Surgeon: Rogene Houston, MD;  Location: [facility];  Service: Endoscopy;;    OB History   No obstetric history on file.      Home Medications    Prior to Admission medications   Medication Sig Start Date End Date Taking? Authorizing Provider  promethazine-dextromethorphan (PROMETHAZINE-DM) 6.25-15 MG/5ML syrup Take 5 mLs by mouth at bedtime as needed for cough. 12/06/21  Yes Alayia Meggison-Warren, Alda Lea, NP  aspirin EC 81 MG tablet Take 1 tablet (81 mg total) by mouth at bedtime. 03/01/20   Rogene Houston, MD  cetirizine (ZYRTEC) 10 MG tablet Take 10 mg by mouth daily.    [provider]  dexlansoprazole (DEXILANT) 60 MG capsule Take 1 capsule (60 mg total) by mouth daily. 02/06/21   Rogene Houston, MD  diphenhydrAMINE HCl, Sleep, (UNISOM SLEEPGELS) 50 MG CAPS Take 50 mg by mouth at bedtime.    [provider]  fluticasone (  FLONASE) 50 MCG/ACT nasal spray Place 2 sprays into both nostrils daily.    [provider]  guaiFENesin (MUCINEX PO) Take by mouth. Takes as needed.    [provider]  metoCLOPramide (REGLAN) 5 MG tablet TAKE 1 TABLET BY MOUTH TWICE DAILY BEFORE MEALS. 06/23/21   Rehman, Mechele Dawley, MD  Polyethyl Glycol-Propyl Glycol (SYSTANE OP) Apply to eye. One drop each eye twice daily    [provider]  rosuvastatin (CRESTOR) 40 MG tablet Take 1 tablet (40 mg total) by mouth daily. Keep upcoming  appointment for future refills. Thank you. 09/23/21   Fay Records, MD    Family History Family History  Problem Relation Age of Onset   Stroke Mother    Stroke Father    Cancer Maternal Uncle    Cancer Paternal Uncle    Cancer Paternal Uncle     Social History Social History   Tobacco Use   Smoking status: Never    Passive exposure: Past   Smokeless tobacco: Never  Vaping Use   Vaping Use: Never used  Substance Use Topics   Alcohol use: No   Drug use: No     Allergies   Codeine, Demerol, Elemental sulfur, Feldene [piroxicam], Latex, and Pyridium [phenazopyridine hcl]   Review of Systems Review of Systems Per HPI  Physical Exam Triage Vital Signs ED Triage Vitals  Enc Vitals Group     BP 12/06/21 0813 (!) 148/84     Pulse Rate 12/06/21 0813 91     Resp 12/06/21 0813 16     Temp 12/06/21 0813 97.9 F (36.6 C)     Temp Source 12/06/21 0813 Oral     SpO2 12/06/21 0813 98 %     Weight --      Height --      Head Circumference --      Peak Flow --      Pain Score 12/06/21 0814 0     Pain Loc --      Pain Edu? --      Excl. in [place]? --    No data found.  Updated Vital Signs BP (!) 148/84 (BP Location: Right Arm)   Pulse 91   Temp 97.9 F (36.6 C) (Oral)   Resp 16   SpO2 98%   Visual Acuity Right Eye Distance:   Left Eye Distance:   Bilateral Distance:    Right Eye Near:   Left Eye Near:    Bilateral Near:     Physical Exam Vitals and nursing note reviewed.  Constitutional:      General: She is not in acute distress.    Appearance: Normal appearance.  HENT:     Head: Normocephalic.     Right Ear: Tympanic membrane, ear canal and external ear normal.     Left Ear: Tympanic membrane, ear canal and external ear normal.     Nose: Congestion present. No rhinorrhea.     Right Turbinates: Enlarged and swollen.     Left Turbinates: Enlarged and swollen.     Right Sinus: No maxillary sinus tenderness or frontal sinus tenderness.     Left Sinus: No  maxillary sinus tenderness or frontal sinus tenderness.     Mouth/Throat:     Lips: Pink.     Mouth: Mucous membranes are moist.     Pharynx: Oropharynx is clear. Posterior oropharyngeal erythema present. No pharyngeal swelling.  Eyes:     Extraocular Movements: Extraocular movements intact.  Pupils: Pupils are equal, round, and reactive to light.  Cardiovascular:     Rate and Rhythm: Normal rate and regular rhythm.     Pulses: Normal pulses.     Heart sounds: Normal heart sounds.  Pulmonary:     Effort: Pulmonary effort is normal.     Breath sounds: Normal breath sounds.  Abdominal:     General: Bowel sounds are normal.     Palpations: Abdomen is soft.  Musculoskeletal:     Cervical back: Normal range of motion.  Lymphadenopathy:     Cervical: No cervical adenopathy.  Skin:    General: Skin is warm and dry.  Neurological:     General: No focal deficit present.     Mental Status: She is alert and oriented to person, place, and time.  Psychiatric:        Mood and Affect: Mood normal.        Behavior: Behavior normal.      UC Treatments / Results  Labs (all labs ordered are listed, but only abnormal results are displayed) Labs Reviewed  SARS CORONAVIRUS 2 (TAT 6-24 HRS)    EKG   Radiology No results found.  Procedures Procedures (including critical care time)  Medications Ordered in UC Medications - No data to display  Initial Impression / Assessment and Plan / UC Course  I have reviewed the triage vital signs and the nursing notes.  Pertinent labs & imaging results that were available during my care of the patient were reviewed by me and considered in my medical decision making (see chart for details).  Patient presents for complaints of upper respiratory symptoms that been present for the past 2 days.  On exam, her vital signs are stable, she is in no acute distress, she is otherwise well-appearing.  Symptoms are consistent with a viral upper respiratory  infection.  COVID test is pending at this time.  Patient is declining antiviral therapy if the COVID test is positive.  Will have patient continue fluticasone and cetirizine at this time.  Promethazine DM was provided to the patient to act as an antihistamine during bedtime.  Supportive care recommendations were provided to the patient including over-the-counter use of Tylenol or ibuprofen, use of a humidifier and sleeping elevated on pillows while sleeping. Strict return precautions.  Patient verbalizes understanding.  All questions were answered.  Patient is stable for discharge. Final Clinical Impressions(s) / UC Diagnoses   Final diagnoses:  Encounter for screening for COVID-19  Viral upper respiratory infection     Discharge Instructions      COVID test is pending.  You will be contacted if the results of the test are positive.  You also have access to the results on your MyChart account.  Recommend remaining isolated until you have received your COVID results.  As discussed, you are declining antiviral therapy at this time. Take medication as prescribed. Increase fluids and allow for plenty of rest. Recommend Tylenol or ibuprofen as needed for pain, fever, or general discomfort. Warm salt water gargles 3-4 times daily to help with throat pain or discomfort. May use normal saline nasal spray throughout the day to help with nasal congestion and drainage. Recommend using a humidifier at bedtime during sleep to help with cough and nasal congestion and sleep elevated on 2 pillows all symptoms persist. As discussed, follow-up in this clinic or with your primary care physician if your symptoms suddenly worsen, or if after 10 days your symptoms continue to persist. Follow-up as  needed.     ED Prescriptions     Medication Sig Dispense Auth. Provider   promethazine-dextromethorphan (PROMETHAZINE-DM) 6.25-15 MG/5ML syrup Take 5 mLs by mouth at bedtime as needed for cough. 118 mL Stefanny Pieri-Warren,  Alda Lea, NP      PDMP not reviewed this encounter.   Tish Men, NP 12/06/21 [phone]

## 2021-12-06 NOTE — ED Triage Notes (Signed)
Nasal drainage and headache since Thursday.

## 2021-12-06 NOTE — Discharge Instructions (Addendum)
COVID test is pending.  You will be contacted if the results of the test are positive.  You also have access to the results on your MyChart account.  Recommend remaining isolated until you have received your COVID results.  As discussed, you are declining antiviral therapy at this time. Take medication as prescribed. Increase fluids and allow for plenty of rest. Recommend Tylenol or ibuprofen as needed for pain, fever, or general discomfort. Warm salt water gargles 3-4 times daily to help with throat pain or discomfort. May use normal saline nasal spray throughout the day to help with nasal congestion and drainage. Recommend using a humidifier at bedtime during sleep to help with cough and nasal congestion and sleep elevated on 2 pillows all symptoms persist. As discussed, follow-up in this clinic or with your primary care physician if your symptoms suddenly worsen, or if after 10 days your symptoms continue to persist. Follow-up as needed.

## 2021-12-11 ENCOUNTER — Ambulatory Visit (INDEPENDENT_AMBULATORY_CARE_PROVIDER_SITE_OTHER): Payer: Medicare PPO | Admitting: Gastroenterology

## 2021-12-23 DIAGNOSIS — M5416 Radiculopathy, lumbar region: Secondary | ICD-10-CM | POA: Diagnosis not present

## 2021-12-26 ENCOUNTER — Other Ambulatory Visit (INDEPENDENT_AMBULATORY_CARE_PROVIDER_SITE_OTHER): Payer: Self-pay | Admitting: Internal Medicine

## 2021-12-26 ENCOUNTER — Other Ambulatory Visit: Payer: Self-pay | Admitting: Internal Medicine

## 2022-01-01 DIAGNOSIS — R059 Cough, unspecified: Secondary | ICD-10-CM | POA: Diagnosis not present

## 2022-01-01 DIAGNOSIS — R030 Elevated blood-pressure reading, without diagnosis of hypertension: Secondary | ICD-10-CM | POA: Diagnosis not present

## 2022-01-15 DIAGNOSIS — M5416 Radiculopathy, lumbar region: Secondary | ICD-10-CM | POA: Diagnosis not present

## 2022-01-26 DIAGNOSIS — J0101 Acute recurrent maxillary sinusitis: Secondary | ICD-10-CM | POA: Diagnosis not present

## 2022-01-26 DIAGNOSIS — J342 Deviated nasal septum: Secondary | ICD-10-CM | POA: Diagnosis not present

## 2022-01-26 DIAGNOSIS — J343 Hypertrophy of nasal turbinates: Secondary | ICD-10-CM | POA: Diagnosis not present

## 2022-02-03 DIAGNOSIS — B351 Tinea unguium: Secondary | ICD-10-CM | POA: Diagnosis not present

## 2022-02-03 DIAGNOSIS — X32XXXD Exposure to sunlight, subsequent encounter: Secondary | ICD-10-CM | POA: Diagnosis not present

## 2022-02-03 DIAGNOSIS — L570 Actinic keratosis: Secondary | ICD-10-CM | POA: Diagnosis not present

## 2022-02-12 ENCOUNTER — Ambulatory Visit (INDEPENDENT_AMBULATORY_CARE_PROVIDER_SITE_OTHER): Payer: Medicare PPO | Admitting: Gastroenterology

## 2022-02-12 ENCOUNTER — Encounter (INDEPENDENT_AMBULATORY_CARE_PROVIDER_SITE_OTHER): Payer: Self-pay | Admitting: Gastroenterology

## 2022-02-12 VITALS — BP 115/63 | HR 91 | Temp 98.2°F | Ht 66.0 in | Wt 155.5 lb

## 2022-02-12 DIAGNOSIS — K3184 Gastroparesis: Secondary | ICD-10-CM | POA: Diagnosis not present

## 2022-02-12 DIAGNOSIS — K219 Gastro-esophageal reflux disease without esophagitis: Secondary | ICD-10-CM

## 2022-02-12 MED ORDER — DEXLANSOPRAZOLE 60 MG PO CPDR
60.0000 mg | DELAYED_RELEASE_CAPSULE | Freq: Every day | ORAL | 11 refills | Status: DC
Start: 2022-02-12 — End: 2022-03-09

## 2022-02-12 NOTE — Patient Instructions (Signed)
-  Continue dexilant '60mg'$  daily  -Can trial off of reglan for 1-2 weeks, if you are having nausea/vomiting, please restart your medication  -Continue with smaller meals and avoiding overeating and trigger foods  -Can continue miralax as needed -continue with Good water intake  Follow up 1 year or sooner if you have any new or worsening issues

## 2022-02-12 NOTE — Progress Notes (Addendum)
Referring Provider: Fay Records, MD Primary Care Physician:  Asencion Noble, MD Primary GI Physician: Previously Rehman   Chief Complaint  Patient presents with   Gastroesophageal Reflux    Follow up on GERD and gastroparesis. Would like to discuss if she can stop reglan or if she should continue.    HPI:   Laurie Bridges is a 77 y.o. female with past medical history of anemia, anxiety, arthritis, CAD, GERD, HTN, gatroparesis   Patient presenting today for f/u of GERD/Gastroparesis   Last seen May 2023, at that time doing much better, no breakthrough GERD symptoms or vomiting. Taking a second dose of reglan maybe 3-4x/month. Using miralax maybe twice per month for constipation.  Recommended to continue on dexilant and reglan.   Present:   GERD: Well controlled on dexilant '60mg'$  daily, no breakthrough symptoms. She tries to avoid over eating. Denies any issues with dysphagia or odynophagia.  Gastroparesis: she is taking reglan once daily. She denies any vomiting. Certain foods will cause her to have more bloating but For the most part she can eat most anything she wants as long as she does this in smaller quantities. She wonders if she can try going off the reglan.   She can sometimes go maybe 3 days without a BM, she will take Miralax and will be regular for a while. She is taking miralax maybe a couple of days in row out of month. She is doing about 40 oz of water per day. She tries to avoid a lot of raw fruits/high fiber given her gastroparesis. Denies rectal bleeding or melena.   GES: 12/02/20: Normal gastric emptying in the first 3 hours with slight decrease in the fourth hour. Last Colonoscopy:- 02/2020 One diminutive polyp in the ascending colon.                            Biopsied-TA                            - External hemorrhoids.                           - Anal papilla(e) were hypertrophied.  Last Endoscopy:2021  - Normal hypopharynx.                           -  Normal esophagus.                           - Z-line irregular, 35 cm from the incisors.                           - 2 cm hiatal hernia.                           - Normal stomach.                           - Normal duodenal bulb, second portion of the                            duodenum and major papilla.                           -  No specimens collected.  Recommendations:  Repeat colonoscopy 7 years   Past Medical History:  Diagnosis Date   Anemia    hx of Fe def many years ago   Anxiety    Arthritis    back   CAD (coronary artery disease)    a. s/p Coronary CT in 06/2018 showing mild stenosis along the mid-LAD, mid-LCx and mid-RCA   GERD (gastroesophageal reflux disease)    Hypertension    PONV (postoperative nausea and vomiting)     Past Surgical History:  Procedure Laterality Date   ABDOMINAL HYSTERECTOMY     partial hysterectomy, ovaries remain   BACK SURGERY     CHOLECYSTECTOMY N/A 11/27/2019   Procedure: LAPAROSCOPIC CHOLECYSTECTOMY;  Surgeon: Virl Cagey, MD;  Location: [facility];  Service: General;  Laterality: N/A;   COLONOSCOPY N/A 02/29/2020   Procedure: COLONOSCOPY;  Surgeon: Rogene Houston, MD;  Location: [facility];  Service: Endoscopy;  Laterality: N/A;  8:45   ESOPHAGEAL DILATION N/A 11/17/2017   Procedure: ESOPHAGEAL DILATION;  Surgeon: Rogene Houston, MD;  Location: [facility];  Service: Endoscopy;  Laterality: N/A;   ESOPHAGOGASTRODUODENOSCOPY N/A 11/17/2017   Procedure: ESOPHAGOGASTRODUODENOSCOPY (EGD);  Surgeon: Rogene Houston, MD;  [facility];  Service: Endoscopy;  Laterality: N/A;  12:00   ESOPHAGOGASTRODUODENOSCOPY N/A 11/02/2019   Procedure: ESOPHAGOGASTRODUODENOSCOPY (EGD);  Surgeon: Rogene Houston, MD;  [facility];  Service: Endoscopy;  Laterality: N/A;  945   FOOT SURGERY     POLYPECTOMY  02/29/2020   Procedure: POLYPECTOMY;  Surgeon: Rogene Houston, MD;  [facility]: [facility];  Service: Endoscopy;;     Current Outpatient Medications  Medication Sig Dispense Refill   aspirin EC 81 MG tablet Take 1 tablet (81 mg total) by mouth at bedtime.     cetirizine (ZYRTEC) 10 MG tablet Take 10 mg by mouth daily.     Cholecalciferol (VITAMIN D-3) 125 MCG (5000 UT) TABS Take by mouth daily.     dexlansoprazole (DEXILANT) 60 MG capsule Take 1 capsule (60 mg total) by mouth daily. 30 capsule 11   diphenhydrAMINE HCl, Sleep, (UNISOM SLEEPGELS) 50 MG CAPS Take 50 mg by mouth at bedtime.     fluticasone (FLONASE) 50 MCG/ACT nasal spray Place 2 sprays into both nostrils daily.     guaiFENesin (MUCINEX PO) Take by mouth. Takes as needed.     LORazepam (ATIVAN) 1 MG tablet Take 1 mg by mouth 2 (two) times daily as needed.     metoCLOPramide (REGLAN) 5 MG tablet TAKE 1 TABLET BY MOUTH TWICE DAILY BEFORE MEALS. 180 tablet 0   Multiple Vitamin (MULTIVITAMIN) tablet Take 1 tablet by mouth daily.     Polyethyl Glycol-Propyl Glycol (SYSTANE OP) Apply to eye. One drop each eye twice daily     PRESCRIPTION MEDICATION Terbinafine '1000mg'$  DMSO SOL at bedtime to nails     rosuvastatin (CRESTOR) 40 MG tablet TAKE 1 TABLET BY MOUTH ONCE A DAY. 90 tablet 2   No current facility-administered medications for this visit.    Allergies as of 02/12/2022 - Review Complete 02/12/2022  Allergen Reaction Noted   Codeine Other (See Comments) 05/27/2010   Demerol Rash 05/27/2010   Elemental sulfur Rash 05/27/2010   Feldene [piroxicam] Rash 05/27/2010   Latex Rash 05/27/2010   Pyridium [phenazopyridine hcl] Itching and Rash 05/27/2010    Family History  Problem Relation Age of Onset   Stroke Mother    Stroke Father  Cancer Maternal Uncle    Cancer Paternal Uncle    Cancer Paternal Uncle     Social History   Socioeconomic History   Marital status: Widowed    Spouse name: Not on file   Number of children: Not on file   Years of education: Not on file   Highest education level: Not on file  Occupational History    Not on file  Tobacco Use   Smoking status: Never    Passive exposure: Past   Smokeless tobacco: Never  Vaping Use   Vaping Use: Never used  Substance and Sexual Activity   Alcohol use: No   Drug use: No   Sexual activity: Never  Other Topics Concern   Not on file  Social History Narrative   Not on file   Social Determinants of Health   Financial Resource Strain: Not on file  Food Insecurity: Not on file  Transportation Needs: Not on file  Physical Activity: Not on file  Stress: Not on file  Social Connections: Not on file   Review of systems General: negative for malaise, night sweats, fever, chills, weight loss Neck: Negative for lumps, goiter, pain and significant neck swelling Resp: Negative for cough, wheezing, dyspnea at rest CV: Negative for chest pain, leg swelling, palpitations, orthopnea GI: denies melena, hematochezia, nausea, vomiting, diarrhea, constipation, dysphagia, odyonophagia, early satiety or unintentional weight loss.  MSK: Negative for joint pain or swelling, back pain, and muscle pain. Derm: Negative for itching or rash Psych: Denies depression, anxiety, memory loss, confusion. No homicidal or suicidal ideation.  Heme: Negative for prolonged bleeding, bruising easily, and swollen nodes. Endocrine: Negative for cold or heat intolerance, polyuria, polydipsia and goiter. Neuro: negative for tremor, gait imbalance, syncope and seizures. The remainder of the review of systems is noncontributory.  Physical Exam: BP 115/63   Pulse 91   Temp 98.2 F (36.8 C) (Oral)   Ht '5\' 6"'$  (1.676 m)   Wt 155 lb 8 oz (70.5 kg)   BMI 25.10 kg/m  General:   Alert and oriented. No distress noted. Pleasant and cooperative.  Head:  Normocephalic and atraumatic. Eyes:  Conjuctiva clear without scleral icterus. Mouth:  Oral mucosa pink and moist. Good dentition. No lesions. Heart: Normal rate and rhythm, s1 and s2 heart sounds present.  Lungs: Clear lung sounds in all  lobes. Respirations equal and unlabored. Abdomen:  +BS, soft, non-tender and non-distended. No rebound or guarding. No HSM or masses noted. Derm: No palmar erythema or jaundice Msk:  Symmetrical without gross deformities. Normal posture. Extremities:  Without edema. Neurologic:  Alert and  oriented x4 Psych:  Alert and cooperative. Normal mood and affect.  Invalid input(s): "6 MONTHS"   ASSESSMENT: VALBORG FRIAR is a 77 y.o. female presenting today for GERD and gastroparesis.  GERD: well managed on dexilant '60mg'$  daily with no breakthrough symptoms. No dysphagia or odynophagia. Has failed other PPIs in the past. Will continue with current regimen  Gastroparesis: GES with mild gastroparesis in 2022, she has been maintained on reglan '5mg'$  BID, though now only taking this once daily in the mornings. No nausea or vomiting. She eats small meals and avoids over eating. She inquires if she needs to continue on reglan since she is doing well. I think it is reasonable to trial off of the medication for 1-2 weeks especially given she is well controlled on such a low dose, should continue to avoid overeating and doe 4-5 small meals throughout the day. If she  begins to have nausea/vomiting, she will need to restart reglan.    PLAN:  Continue dexilant '60mg'$  daily  2. Can trial off of reglan for 1-2 weeks, resume if having symptoms  3. Continue with smaller meals and avoiding  4. Can continue miralax as needed 5. Good water intake.   All questions were answered, patient verbalized understanding and is in agreement with plan as outlined above.   Follow Up: 1 year   Rozetta Stumpp L. Alver Sorrow, MSN, APRN, AGNP-C Adult-Gerontology Nurse Practitioner [facility]  I have reviewed the note and agree with the APP's assessment as described in this progress note  Maylon Peppers, MD Gastroenterology and Hepatology [facility] Gastroenterology

## 2022-02-19 DIAGNOSIS — J342 Deviated nasal septum: Secondary | ICD-10-CM | POA: Diagnosis not present

## 2022-02-19 DIAGNOSIS — R519 Headache, unspecified: Secondary | ICD-10-CM | POA: Diagnosis not present

## 2022-02-19 DIAGNOSIS — J343 Hypertrophy of nasal turbinates: Secondary | ICD-10-CM | POA: Diagnosis not present

## 2022-02-19 DIAGNOSIS — J0101 Acute recurrent maxillary sinusitis: Secondary | ICD-10-CM | POA: Diagnosis not present

## 2022-02-25 ENCOUNTER — Other Ambulatory Visit: Payer: Self-pay | Admitting: Otolaryngology

## 2022-02-25 DIAGNOSIS — J329 Chronic sinusitis, unspecified: Secondary | ICD-10-CM

## 2022-03-02 DIAGNOSIS — F419 Anxiety disorder, unspecified: Secondary | ICD-10-CM | POA: Diagnosis not present

## 2022-03-02 DIAGNOSIS — N951 Menopausal and female climacteric states: Secondary | ICD-10-CM | POA: Diagnosis not present

## 2022-03-03 DIAGNOSIS — R519 Headache, unspecified: Secondary | ICD-10-CM | POA: Diagnosis not present

## 2022-03-05 ENCOUNTER — Ambulatory Visit
Admission: RE | Admit: 2022-03-05 | Discharge: 2022-03-05 | Disposition: A | Discharge status: Home / Self Care | Payer: Medicare PPO | Source: Ambulatory Visit | Attending: Otolaryngology | Admitting: Otolaryngology

## 2022-03-05 DIAGNOSIS — J329 Chronic sinusitis, unspecified: Secondary | ICD-10-CM | POA: Diagnosis not present

## 2022-03-05 DIAGNOSIS — J342 Deviated nasal septum: Secondary | ICD-10-CM | POA: Diagnosis not present

## 2022-03-05 DIAGNOSIS — M5416 Radiculopathy, lumbar region: Secondary | ICD-10-CM | POA: Diagnosis not present

## 2022-03-05 DIAGNOSIS — Z6825 Body mass index (BMI) 25.0-25.9, adult: Secondary | ICD-10-CM | POA: Diagnosis not present

## 2022-03-09 ENCOUNTER — Other Ambulatory Visit (INDEPENDENT_AMBULATORY_CARE_PROVIDER_SITE_OTHER): Payer: Self-pay | Admitting: Internal Medicine

## 2022-03-12 DIAGNOSIS — J310 Chronic rhinitis: Secondary | ICD-10-CM | POA: Diagnosis not present

## 2022-03-12 DIAGNOSIS — J342 Deviated nasal septum: Secondary | ICD-10-CM | POA: Diagnosis not present

## 2022-03-12 DIAGNOSIS — J343 Hypertrophy of nasal turbinates: Secondary | ICD-10-CM | POA: Diagnosis not present

## 2022-03-25 DIAGNOSIS — B351 Tinea unguium: Secondary | ICD-10-CM | POA: Diagnosis not present

## 2022-03-25 DIAGNOSIS — D485 Neoplasm of uncertain behavior of skin: Secondary | ICD-10-CM | POA: Diagnosis not present

## 2022-03-25 DIAGNOSIS — D044 Carcinoma in situ of skin of scalp and neck: Secondary | ICD-10-CM | POA: Diagnosis not present

## 2022-04-06 DIAGNOSIS — F419 Anxiety disorder, unspecified: Secondary | ICD-10-CM | POA: Diagnosis not present

## 2022-04-23 DIAGNOSIS — I10 Essential (primary) hypertension: Secondary | ICD-10-CM | POA: Diagnosis not present

## 2022-04-23 DIAGNOSIS — E785 Hyperlipidemia, unspecified: Secondary | ICD-10-CM | POA: Diagnosis not present

## 2022-04-23 DIAGNOSIS — R002 Palpitations: Secondary | ICD-10-CM | POA: Diagnosis not present

## 2022-04-23 DIAGNOSIS — F419 Anxiety disorder, unspecified: Secondary | ICD-10-CM | POA: Diagnosis not present

## 2022-04-23 DIAGNOSIS — Z79899 Other long term (current) drug therapy: Secondary | ICD-10-CM | POA: Diagnosis not present

## 2022-04-24 LAB — LAB REPORT - SCANNED: EGFR: 46

## 2022-04-30 DIAGNOSIS — I700 Atherosclerosis of aorta: Secondary | ICD-10-CM | POA: Diagnosis not present

## 2022-04-30 DIAGNOSIS — N1831 Chronic kidney disease, stage 3a: Secondary | ICD-10-CM | POA: Diagnosis not present

## 2022-04-30 DIAGNOSIS — I2510 Atherosclerotic heart disease of native coronary artery without angina pectoris: Secondary | ICD-10-CM | POA: Diagnosis not present

## 2022-04-30 DIAGNOSIS — F419 Anxiety disorder, unspecified: Secondary | ICD-10-CM | POA: Diagnosis not present

## 2022-04-30 DIAGNOSIS — E785 Hyperlipidemia, unspecified: Secondary | ICD-10-CM | POA: Diagnosis not present

## 2022-06-10 DIAGNOSIS — J342 Deviated nasal septum: Secondary | ICD-10-CM | POA: Diagnosis not present

## 2022-06-10 DIAGNOSIS — J343 Hypertrophy of nasal turbinates: Secondary | ICD-10-CM | POA: Diagnosis not present

## 2022-06-10 DIAGNOSIS — J310 Chronic rhinitis: Secondary | ICD-10-CM | POA: Diagnosis not present

## 2022-06-10 DIAGNOSIS — H6123 Impacted cerumen, bilateral: Secondary | ICD-10-CM | POA: Diagnosis not present

## 2022-07-07 DIAGNOSIS — L821 Other seborrheic keratosis: Secondary | ICD-10-CM | POA: Diagnosis not present

## 2022-07-07 DIAGNOSIS — L570 Actinic keratosis: Secondary | ICD-10-CM | POA: Diagnosis not present

## 2022-07-07 DIAGNOSIS — L602 Onychogryphosis: Secondary | ICD-10-CM | POA: Diagnosis not present

## 2022-07-07 DIAGNOSIS — B351 Tinea unguium: Secondary | ICD-10-CM | POA: Diagnosis not present

## 2022-07-07 DIAGNOSIS — Z85828 Personal history of other malignant neoplasm of skin: Secondary | ICD-10-CM | POA: Diagnosis not present

## 2022-07-15 DIAGNOSIS — F419 Anxiety disorder, unspecified: Secondary | ICD-10-CM | POA: Diagnosis not present

## 2022-07-15 DIAGNOSIS — R202 Paresthesia of skin: Secondary | ICD-10-CM | POA: Diagnosis not present

## 2022-07-15 DIAGNOSIS — Z79899 Other long term (current) drug therapy: Secondary | ICD-10-CM | POA: Diagnosis not present

## 2022-07-15 DIAGNOSIS — N1831 Chronic kidney disease, stage 3a: Secondary | ICD-10-CM | POA: Diagnosis not present

## 2022-07-28 DIAGNOSIS — M5416 Radiculopathy, lumbar region: Secondary | ICD-10-CM | POA: Diagnosis not present

## 2022-07-28 DIAGNOSIS — R200 Anesthesia of skin: Secondary | ICD-10-CM | POA: Diagnosis not present

## 2022-07-28 DIAGNOSIS — Z6825 Body mass index (BMI) 25.0-25.9, adult: Secondary | ICD-10-CM | POA: Diagnosis not present

## 2022-07-28 DIAGNOSIS — R202 Paresthesia of skin: Secondary | ICD-10-CM | POA: Diagnosis not present

## 2022-07-29 ENCOUNTER — Other Ambulatory Visit: Payer: Self-pay | Admitting: Student

## 2022-07-29 DIAGNOSIS — M5416 Radiculopathy, lumbar region: Secondary | ICD-10-CM

## 2022-07-30 DIAGNOSIS — F419 Anxiety disorder, unspecified: Secondary | ICD-10-CM | POA: Diagnosis not present

## 2022-07-30 DIAGNOSIS — N1831 Chronic kidney disease, stage 3a: Secondary | ICD-10-CM | POA: Diagnosis not present

## 2022-08-02 ENCOUNTER — Ambulatory Visit
Admission: RE | Admit: 2022-08-02 | Discharge: 2022-08-02 | Disposition: A | Discharge status: Home / Self Care | Payer: Medicare PPO | Source: Ambulatory Visit | Attending: Student | Admitting: Student

## 2022-08-02 DIAGNOSIS — M4186 Other forms of scoliosis, lumbar region: Secondary | ICD-10-CM | POA: Diagnosis not present

## 2022-08-02 DIAGNOSIS — M47816 Spondylosis without myelopathy or radiculopathy, lumbar region: Secondary | ICD-10-CM | POA: Diagnosis not present

## 2022-08-02 DIAGNOSIS — M5416 Radiculopathy, lumbar region: Secondary | ICD-10-CM

## 2022-09-03 DIAGNOSIS — G5601 Carpal tunnel syndrome, right upper limb: Secondary | ICD-10-CM | POA: Diagnosis not present

## 2022-09-09 ENCOUNTER — Other Ambulatory Visit (HOSPITAL_COMMUNITY): Payer: Self-pay | Admitting: Internal Medicine

## 2022-09-09 DIAGNOSIS — Z1231 Encounter for screening mammogram for malignant neoplasm of breast: Secondary | ICD-10-CM

## 2022-09-10 DIAGNOSIS — R0982 Postnasal drip: Secondary | ICD-10-CM | POA: Diagnosis not present

## 2022-09-10 DIAGNOSIS — J342 Deviated nasal septum: Secondary | ICD-10-CM | POA: Diagnosis not present

## 2022-09-10 DIAGNOSIS — J310 Chronic rhinitis: Secondary | ICD-10-CM | POA: Diagnosis not present

## 2022-09-10 DIAGNOSIS — J343 Hypertrophy of nasal turbinates: Secondary | ICD-10-CM | POA: Diagnosis not present

## 2022-09-15 DIAGNOSIS — Z6825 Body mass index (BMI) 25.0-25.9, adult: Secondary | ICD-10-CM | POA: Diagnosis not present

## 2022-09-15 DIAGNOSIS — M5416 Radiculopathy, lumbar region: Secondary | ICD-10-CM | POA: Diagnosis not present

## 2022-09-25 ENCOUNTER — Encounter (HOSPITAL_COMMUNITY): Payer: Self-pay

## 2022-09-25 ENCOUNTER — Ambulatory Visit (HOSPITAL_COMMUNITY)
Admission: RE | Admit: 2022-09-25 | Discharge: 2022-09-25 | Disposition: A | Discharge status: Home / Self Care | Payer: Medicare PPO | Source: Ambulatory Visit | Attending: Internal Medicine | Admitting: Internal Medicine

## 2022-09-25 DIAGNOSIS — Z1231 Encounter for screening mammogram for malignant neoplasm of breast: Secondary | ICD-10-CM | POA: Diagnosis not present

## 2022-09-28 ENCOUNTER — Other Ambulatory Visit: Payer: Self-pay

## 2022-09-28 ENCOUNTER — Ambulatory Visit (HOSPITAL_COMMUNITY): Payer: Medicare PPO | Attending: Student | Admitting: Physical Therapy

## 2022-09-28 DIAGNOSIS — R252 Cramp and spasm: Secondary | ICD-10-CM | POA: Insufficient documentation

## 2022-09-28 DIAGNOSIS — M25552 Pain in left hip: Secondary | ICD-10-CM | POA: Insufficient documentation

## 2022-09-28 DIAGNOSIS — M6281 Muscle weakness (generalized): Secondary | ICD-10-CM | POA: Diagnosis not present

## 2022-09-28 DIAGNOSIS — R262 Difficulty in walking, not elsewhere classified: Secondary | ICD-10-CM | POA: Insufficient documentation

## 2022-09-28 DIAGNOSIS — R29898 Other symptoms and signs involving the musculoskeletal system: Secondary | ICD-10-CM | POA: Insufficient documentation

## 2022-09-28 NOTE — Therapy (Signed)
OUTPATIENT PHYSICAL THERAPY THORACOLUMBAR EVALUATION   Patient Name: Laurie Bridges MRN: [id number] DOB:[birth year], 77 y.o., female Today's Date: 09/28/2022  END OF SESSION:  PT End of Session - 09/28/22 1345     Visit Number 1    Number of Visits 6    Date for PT Re-Evaluation 11/09/22    Authorization Type [employer]    PT Start Time 1340    PT Stop Time 1425    PT Time Calculation (min) 45 min    Activity Tolerance Patient tolerated treatment well    Behavior During Therapy WFL for tasks assessed/performed             Past Medical History:  Diagnosis Date   Anemia    hx of Fe def many years ago   Anxiety    Arthritis    back   CAD (coronary artery disease)    a. s/p Coronary CT in 06/2018 showing mild stenosis along the mid-LAD, mid-LCx and mid-RCA   GERD (gastroesophageal reflux disease)    Hypertension    PONV (postoperative nausea and vomiting)    Past Surgical History:  Procedure Laterality Date   ABDOMINAL HYSTERECTOMY     partial hysterectomy, ovaries remain   BACK SURGERY     CHOLECYSTECTOMY N/A 11/27/2019   Procedure: LAPAROSCOPIC CHOLECYSTECTOMY;  Surgeon: Lucretia Roers, MD;  Location: [facility];  Service: General;  Laterality: N/A;   COLONOSCOPY N/A 02/29/2020   Procedure: COLONOSCOPY;  Surgeon: Malissa Hippo, MD;  [facility];  Service: Endoscopy;  Laterality: N/A;  8:45   ESOPHAGEAL DILATION N/A 11/17/2017   Procedure: ESOPHAGEAL DILATION;  Surgeon: Malissa Hippo, MD;  [facility];  Service: Endoscopy;  Laterality: N/A;   ESOPHAGOGASTRODUODENOSCOPY N/A 11/17/2017   Procedure: ESOPHAGOGASTRODUODENOSCOPY (EGD);  Surgeon: Malissa Hippo, MD;  [facility];  Service: Endoscopy;  Laterality: N/A;  12:00   ESOPHAGOGASTRODUODENOSCOPY N/A 11/02/2019   Procedure: ESOPHAGOGASTRODUODENOSCOPY (EGD);  Surgeon: Malissa Hippo, MD;  [facility];  Service: Endoscopy;  Laterality: N/A;  945   FOOT  SURGERY     POLYPECTOMY  02/29/2020   Procedure: POLYPECTOMY;  Surgeon: Malissa Hippo, MD;  [facility];  Service: Endoscopy;;   Patient Active Problem List   Diagnosis Date Noted   Gastroparesis 02/06/2021   Postprandial bloating 11/21/2020   Early satiety 11/21/2020   Gallstones 11/17/2018   Gastroesophageal reflux disease without esophagitis 09/14/2017   Esophageal dysphagia 09/14/2017   Dyspnea 08/02/2013   GERD (gastroesophageal reflux disease) 08/02/2013    PCP: Carylon Perches, MD  REFERRING PROVIDER: Sherryl Manges, NP  REFERRING DIAG: M54.16 (ICD-10-CM) - Radiculopathy, lumbar region  Rationale for Evaluation and Treatment: Rehabilitation  THERAPY DIAG:  Cramp and spasm  Pain in left hip  Difficulty in walking, not elsewhere classified  ONSET DATE:  2 years ago  SUBJECTIVE:  SUBJECTIVE STATEMENT: Pt states she had L hip replacement 2 years ago. Went through PT. Tried PT on her back but it hurt her L hip. Did injections but it didn't help. Went to neurosurgeon for a year and they thought it has to do with her hip. Wanted her to try dry needling. Pt states when she moves it feels like a knife in her L groin at times and it can grab. Pain goes front of thigh and on the side of her leg. States her legs will give sometimes. Pt states it feels very tight in her front thigh for the last 2 years. Pt states she has had near falls. Had one fall recently in which her leg gave way  PERTINENT HISTORY:  From H&P: "Complains of left groin and anterior thigh pain. She states that her leg will buckle at times. She has a history of a left hip replacement."   PAIN:  Are you having pain? Yes: NPRS scale: 20 at worst; 0 at rest just "tight"/10 Pain location: Front of left thigh and  groin Pain description: tightness Aggravating factors: standing, weight on it Relieving factors: Sitting  PRECAUTIONS: Fall  RED FLAGS: None   WEIGHT BEARING RESTRICTIONS: No  FALLS:  Has patient fallen in last 6 months? Yes. Number of falls 1  LIVING ENVIRONMENT: Lives with: lives alone Lives in: House/apartment Stairs: Yes: External: 3 steps; on right going up Has following equipment at home: None  OCCUPATION: likes to bowl  PLOF: Independent  PATIENT GOALS: Improve balance and pain  NEXT MD VISIT: n/a  OBJECTIVE:   DIAGNOSTIC FINDINGS:  Lumbar MRI 08/02/22 IMPRESSION: Unchanged lumbar disc and facet degeneration without spinal stenosis or compressive neural foraminal stenosis.  PATIENT SURVEYS:  TBA  SCREENING FOR RED FLAGS: Bowel or bladder incontinence: No Spinal tumors: No Cauda equina syndrome: No Compression fracture: No Abdominal aneurysm: No  COGNITION: Overall cognitive status: Within functional limits for tasks assessed     SENSATION: WFL  MUSCLE LENGTH: Hamstrings: did not assess Thomas test: Right 10 deg; Left 0 deg  POSTURE: No Significant postural limitations  PALPATION: TTP L TFL, quad, adductor  LUMBAR ROM:   AROM eval  Flexion   Extension   Right lateral flexion   Left lateral flexion   Right rotation   Left rotation    (Blank rows = not tested)  LOWER EXTREMITY ROM:     Active  Right eval Left eval  Hip flexion >120 110 (feels pinching in hip)  Hip extension 10 0  Hip abduction >45 45  Hip adduction    Hip internal rotation    Hip external rotation    Knee flexion    Knee extension    Ankle dorsiflexion    Ankle plantarflexion    Ankle inversion    Ankle eversion     (Blank rows = not tested)  LOWER EXTREMITY MMT:    MMT Right eval Left eval  Hip flexion 5 5  Hip extension  4  Hip abduction    Hip adduction    Hip internal rotation    Hip external rotation    Knee flexion 5 5  Knee extension 5 4+   Ankle dorsiflexion    Ankle plantarflexion    Ankle inversion    Ankle eversion     (Blank rows = not tested)  LUMBAR SPECIAL TESTS:  FABER test: Positive and Thomas test: Positive  FUNCTIONAL TESTS:  Did not assess  GAIT: Distance walked: Into clinic Assistive  device utilized: None Level of assistance: Complete Independence Comments: Antalgic, L LE slightly extended out, decreased stance time on L  TODAY'S TREATMENT:                                                                                                                              DATE: 09/28/22  See HEP below for stretches Manual therapy  Skilled assessment and palpation for TPDN  Trigger Point Dry-Needling  Treatment instructions: Expect mild to moderate muscle soreness. S/S of pneumothorax if dry needled over a lung field, and to seek immediate medical attention should they occur. Patient verbalized understanding of these instructions and education.  Patient Consent Given: Yes Education handout provided: Yes Muscles treated: L TFL, L proximal quad, L adductor Electrical stimulation performed: No Parameters: N/A Treatment response/outcome: Decreased muscle tension    PATIENT EDUCATION:  Education details: Dry needling, exam findings, POC Person educated:  Patient, friend Education method: Explanation, Facilities manager, and Handouts Education comprehension: verbalized understanding, returned demonstration, and needs further education  HOME EXERCISE PROGRAM: Access Code: [email or web address] URL: [email or web address] Date: 09/28/2022 Prepared by: Vernon Prey April Kirstie Peri  Exercises - Prone Quadriceps Stretch with Strap  - 1 x daily - 7 x weekly - 2 sets - 30 sec hold - Prone Hip Flexor Stretch on Table with Strap  - 1 x daily - 7 x weekly - 2 sets - 30 sec hold - Sidelying ITB Stretch off Table  - 1 x daily - 7 x weekly - 2 sets - 10 reps  Patient Education - Trigger Point Dry  Needling  ASSESSMENT:  CLINICAL IMPRESSION: Patient is a 77 y.o. F who was seen today for physical therapy evaluation and treatment for L anterior hip and groin pain. Assessment significant for increased muscle spasm and trigger point in L hip flexors and adductors. Decreased L hip ROM noted with some hip extensor weakness limiting transfers and ambulation. Performed trial of TPDN to address her hip tightness. Pt will benefit from PT to address these issues to decrease pain and improve transfers.   OBJECTIVE IMPAIRMENTS: Abnormal gait, decreased activity tolerance, decreased balance, decreased mobility, difficulty walking, decreased ROM, decreased strength, hypomobility, increased fascial restrictions, increased muscle spasms, impaired flexibility, and pain.   ACTIVITY LIMITATIONS: bending, standing, squatting, stairs, transfers, and locomotion level  PARTICIPATION LIMITATIONS: shopping, community activity, and yard work  PERSONAL FACTORS: Age, Fitness, Past/current experiences, and Time since onset of injury/illness/exacerbation are also affecting patient's functional outcome.   REHAB POTENTIAL: Good  CLINICAL DECISION MAKING: Evolving/moderate complexity  EVALUATION COMPLEXITY: Moderate   GOALS: Goals reviewed with patient? Yes  SHORT TERM GOALS: Target date: 10/19/2022   Pt will be ind with initial HEP Baseline: Goal status: INITIAL  2.  Pt will subjectively report 50% improvement in hip tightness.  Baseline:  Goal status: INITIAL   LONG TERM GOALS: Target date: 11/09/2022   Pt will be ind with management and progression of HEP Baseline:  Goal status:  INITIAL  2.  Pt will be able to tolerate modified Thomas stretch with hip ext of at least 10 deg Baseline: Thomas stretch increased pain in L anterior hip Goal status: INITIAL  3.  Pt will have L = R hip ROM Baseline:  Goal status: INITIAL  4.  Pt will report decrease in pain by >/=75%  Baseline:  Goal status:  INITIAL   PLAN:  PT FREQUENCY: 1x/week  PT DURATION: 6 weeks  PLANNED INTERVENTIONS: Therapeutic exercises, Therapeutic activity, Neuromuscular re-education, Balance training, Gait training, Patient/Family education, Self Care, Joint mobilization, Stair training, Aquatic Therapy, Dry Needling, Spinal mobilization, Cryotherapy, Moist heat, Taping, Vasopneumatic device, Traction, Ionotophoresis 4mg /ml Dexamethasone, Manual therapy, and Re-evaluation.  PLAN FOR NEXT SESSION: Assess response to HEP and TPDN. Continue needling, stretching, and joint mobilization of L hip.    Mikaeel Petrow April Ma L Peter Keyworth, PT 09/28/2022, 2:27 PM

## 2022-10-01 ENCOUNTER — Ambulatory Visit: Payer: Medicare PPO | Admitting: Neurology

## 2022-10-01 ENCOUNTER — Encounter: Payer: Self-pay | Admitting: Neurology

## 2022-10-01 VITALS — BP 133/81 | HR 67 | Ht 67.0 in | Wt 153.4 lb

## 2022-10-01 DIAGNOSIS — R202 Paresthesia of skin: Secondary | ICD-10-CM

## 2022-10-01 DIAGNOSIS — F419 Anxiety disorder, unspecified: Secondary | ICD-10-CM | POA: Diagnosis not present

## 2022-10-01 NOTE — Progress Notes (Signed)
[id number]    Provider:  Dr Lucia Gaskins Requesting Provider: Carylon Perches, MD Primary Care Provider:  Carylon Perches, MD  CC:  nonspecific paresthesias  HPI:  Laurie Bridges is a 77 y.o. female here as requested by Carylon Perches, MD for paresthesias. has Dyspnea; GERD (gastroesophageal reflux disease); Gastroesophageal reflux disease without esophagitis; Esophageal dysphagia; Gallstones; Postprandial bloating; Early satiety; and Gastroparesis on their problem list.  I reviewed Dr. Alonza Smoker notes which shows she was evaluated for some different sensations recently a sensation of shock in her left leg with radiated up to her body to her head this is very brief and has not recurred after it happened she states she heard a female voice call out her name.  She had an episode of tingling across the forehead to the left and right she is not presently symptomatic she had 1 fall at home hitting the back of her head on a rug.  No loss of consciousness she did not seek medical attention at the time recent blood pressures and heart rates have been normal she has had ongoing issues with her left leg previously evaluated by neurosurgery.  Sertraline was added last year.  She declined a neuro consult in the past but saw neurosurgery.  I reviewed epic and Care Everywhere and I could not find neurosurgery note will discuss with patient today.  Back around January after christmas she had an episode where she thought she had to replace all the plumbing in her house and her whole body was on fire and her head felt like she was on fire. Lorazepam helped. She was talking one in the morning and one at night it resolved she is on one at night. Had an emg with Dr. Ramond Craver and CTS but no other nerve disorder. She has had it all over the body but she has now small discrete tingles in different parts ot her body all pver and lips tingling. I did not get any of this informationin the consult or any labs she goes every 3 months  for blood work and nothing is unusual per patient report discussed hgba1c 5.9 a year ago in epic and she states it has been checked her ferritin is low and she was put on an iron pill. Here with friend who alsp provides much information.  Reviewed notes, labs and imaging from outside physicians, which showed:  03/03/2022: Final Report (ZZ)   07/2022: MRI lumbar spine: CLINICAL DATA:  Chronic low back pain with left leg pain and numbness. Prior lumbar surgeries.   EXAM: MRI LUMBAR SPINE WITHOUT CONTRAST   TECHNIQUE: Multiplanar, multisequence MR imaging of the lumbar spine was performed. No intravenous contrast was administered.   COMPARISON:  For spine MRI 08/20/2021   FINDINGS: Segmentation:  Standard.   Alignment:  Mild lumbar dextroscoliosis.  No significant listhesis.   Vertebrae: No fracture, suspicious marrow lesion, or evidence of discitis. Degenerative endplate changes primarily from L3-S1 including persistent mild edema which is greatest along the L4 superior endplate.   Conus medullaris and cauda equina: Conus extends to the L1-2 level. Conus and cauda equina appear normal.   Paraspinal and other soft tissues: Partially visualized right renal cysts as previously seen and with no follow-up imaging required based on today's study.   Disc levels:   Disc desiccation throughout the lumbar spine. Advanced disc space narrowing from L3-4 through L5-S1.   T12-L1: Negative.   L1-2: Mild facet and ligamentum flavum hypertrophy without disc herniation or stenosis.  L2-3: Disc bulging and moderate facet and ligamentum flavum hypertrophy without stenosis, unchanged.   L3-4: Disc bulging and moderate facet and ligamentum flavum hypertrophy result in minimal bilateral neural foraminal narrowing without spinal stenosis, unchanged.   L4-5: Disc bulging eccentric to the right, endplate spurring, and moderate facet hypertrophy result in minimal right neural  foraminal narrowing without spinal stenosis, unchanged.   L5-S1: Prior left laminectomy with scarring about the left S1 nerve root. Disc bulging, endplate spurring, and mild-to-moderate facet hypertrophy result in minimal to mild bilateral neural foraminal stenosis without spinal stenosis, unchanged.   IMPRESSION: Unchanged lumbar disc and facet degeneration without spinal stenosis or compressive neural foraminal stenosis.   09/2021: Hgba1c 5.9     Latest Ref Rng & Units 11/25/2020   10:28 AM 10/16/2020    2:38 PM 10/26/2019    3:11 PM  CBC  WBC 3.8 - 10.8 Thousand/uL 5.3  5.3  6.1   Hemoglobin 11.7 - 15.5 g/dL 65.7  84.6  96.2   Hematocrit 35.0 - 45.0 % 39.4  37.2  40.8   Platelets 140 - 400 Thousand/uL 289  339  312       Latest Ref Rng & Units 11/25/2020   10:28 AM 11/15/2020    3:01 PM 10/16/2020    2:38 PM  CMP  Glucose 65 - 139 mg/dL 952  95  841   BUN 7 - 25 mg/dL 15  12  15    Creatinine 0.60 - 1.00 mg/dL 3.24  4.01  0.27   Sodium 135 - 146 mmol/L 141  139  139   Potassium 3.5 - 5.3 mmol/L 4.3  3.9  3.8   Chloride 98 - 110 mmol/L 109  107  108   CO2 20 - 32 mmol/L 22  28  24    Calcium 8.6 - 10.4 mg/dL 25.3  9.7  9.6   Total Protein 6.1 - 8.1 g/dL 6.6     Total Bilirubin 0.2 - 1.2 mg/dL 0.3     AST 10 - 35 U/L 19     ALT 6 - 29 U/L 15        Review of Systems: Patient complains of symptoms per HPI as well as the following symptoms stress,anxiety,longstanding. Pertinent negatives and positives per HPI. All others negative.   Social History   Socioeconomic History   Marital status: Widowed    Spouse name: Not on file   Number of children: Not on file   Years of education: Not on file   Highest education level: Not on file  Occupational History   Not on file  Tobacco Use   Smoking status: Never    Passive exposure: Past   Smokeless tobacco: Never  Vaping Use   Vaping status: Never Used  Substance and Sexual Activity   Alcohol use: No   Drug use: No    Sexual activity: Never  Other Topics Concern   Not on file  Social History Narrative   Not on file   Social Determinants of Health   Financial Resource Strain: Not on file  Food Insecurity: Not on file  Transportation Needs: Not on file  Physical Activity: Not on file  Stress: Not on file  Social Connections: Not on file  Intimate Partner Violence: Not on file    Family History  Problem Relation Age of Onset   Stroke Mother    Stroke Father    Neuropathy Father    Cancer Maternal Uncle    Cancer Paternal Kateri Mc  Cancer Paternal Uncle    Neuropathy Child     Past Medical History:  Diagnosis Date   Anemia    hx of Fe def many years ago   Anxiety    Arthritis    back   CAD (coronary artery disease)    a. s/p Coronary CT in 06/2018 showing mild stenosis along the mid-LAD, mid-LCx and mid-RCA   GERD (gastroesophageal reflux disease)    Hypertension    PONV (postoperative nausea and vomiting)     Patient Active Problem List   Diagnosis Date Noted   Gastroparesis 02/06/2021   Postprandial bloating 11/21/2020   Early satiety 11/21/2020   Gallstones 11/17/2018   Gastroesophageal reflux disease without esophagitis 09/14/2017   Esophageal dysphagia 09/14/2017   Dyspnea 08/02/2013   GERD (gastroesophageal reflux disease) 08/02/2013    Past Surgical History:  Procedure Laterality Date   ABDOMINAL HYSTERECTOMY     partial hysterectomy, ovaries remain   BACK SURGERY     CHOLECYSTECTOMY N/A 11/27/2019   Procedure: LAPAROSCOPIC CHOLECYSTECTOMY;  Surgeon: Lucretia Roers, MD;  Location: [facility];  Service: General;  Laterality: N/A;   COLONOSCOPY N/A 02/29/2020   Procedure: COLONOSCOPY;  Surgeon: Malissa Hippo, MD;  Location: [facility];  Service: Endoscopy;  Laterality: N/A;  8:45   ESOPHAGEAL DILATION N/A 11/17/2017   Procedure: ESOPHAGEAL DILATION;  Surgeon: Malissa Hippo, MD;  Location: [facility];  Service: Endoscopy;  Laterality: N/A;    ESOPHAGOGASTRODUODENOSCOPY N/A 11/17/2017   Procedure: ESOPHAGOGASTRODUODENOSCOPY (EGD);  Surgeon: Malissa Hippo, MD;  Location: [facility];  Service: Endoscopy;  Laterality: N/A;  12:00   ESOPHAGOGASTRODUODENOSCOPY N/A 11/02/2019   Procedure: ESOPHAGOGASTRODUODENOSCOPY (EGD);  Surgeon: Malissa Hippo, MD;  Location: [facility];  Service: Endoscopy;  Laterality: N/A;  945   FOOT SURGERY     POLYPECTOMY  02/29/2020   Procedure: POLYPECTOMY;  Surgeon: Malissa Hippo, MD;  Location: [facility];  Service: Endoscopy;;    Current Outpatient Medications  Medication Sig Dispense Refill   aspirin EC 81 MG tablet Take 1 tablet (81 mg total) by mouth at bedtime.     cetirizine (ZYRTEC) 10 MG tablet Take 10 mg by mouth daily.     Cholecalciferol (VITAMIN D-3) 125 MCG (5000 UT) TABS Take by mouth daily.     dexlansoprazole (DEXILANT) 60 MG capsule TAKE ONE CAPSULE BY MOUTH ONCE DAILY. 30 capsule 11   fluticasone (FLONASE) 50 MCG/ACT nasal spray Place 2 sprays into both nostrils daily.     guaiFENesin (MUCINEX PO) Take by mouth as needed. Takes as needed.     LORazepam (ATIVAN) 1 MG tablet Take 1 mg by mouth at bedtime.     metoCLOPramide (REGLAN) 5 MG tablet TAKE 1 TABLET BY MOUTH TWICE DAILY BEFORE MEALS. (Patient taking differently: as needed.) 180 tablet 0   Polyethyl Glycol-Propyl Glycol (SYSTANE OP) Apply to eye. One drop each eye twice daily     Polyethylene Glycol 3350 (MIRALAX PO) Take by mouth as needed.     rosuvastatin (CRESTOR) 40 MG tablet TAKE 1 TABLET BY MOUTH ONCE A DAY. 90 tablet 2   No current facility-administered medications for this visit.    Allergies as of 10/01/2022 - Review Complete 10/01/2022  Allergen Reaction Noted   Codeine Other (See Comments) 05/27/2010   Demerol Rash 05/27/2010   Elemental sulfur Rash 05/27/2010   Feldene [piroxicam] Rash 05/27/2010   Latex Rash 05/27/2010   Pyridium [phenazopyridine hcl] Itching and Rash 05/27/2010  Vitals: BP  133/81   Pulse 67   Ht 5\' 7"  (1.702 m)   Wt 153 lb 6.4 oz (69.6 kg)   BMI 24.03 kg/m  Last Weight:  Wt Readings from Last 1 Encounters:  10/01/22 153 lb 6.4 oz (69.6 kg)   Last Height:   Ht Readings from Last 1 Encounters:  10/01/22 5\' 7"  (1.702 m)     Physical exam: Exam: Gen: NAD, conversant, well nourised, obese, well groomed                     CV: RRR, no MRG. No Carotid Bruits. No peripheral edema, warm, nontender Eyes: Conjunctivae clear without exudates or hemorrhage  Neuro: Detailed Neurologic Exam  Speech:    Speech is normal; fluent and spontaneous with normal comprehension.  Cognition:    The patient is oriented to person, place, and time;     recent and remote memory intact;     language fluent;     normal attention, concentration,     fund of knowledge Cranial Nerves:    The pupils are equal, round, and reactive to light. No ONH edema Visual fields are full to finger confrontation. Extraocular movements are intact. Trigeminal sensation is intact and the muscles of mastication are normal. The face is symmetric. The palate elevates in the midline. Hearing intact. Voice is normal. Shoulder shrug is normal. The tongue has normal motion without fasciculations.   Coordination: nml  Gait:  Antalgic, hipe stiffness Left AJ 1+ right 2+   Motor Observation:    No asymmetry, no atrophy, and no involuntary movements noted. Tone:    Normal muscle tone.    Posture:    Posture is normal. normal erect    Strength:    Strength is V/V in the upper and lower limbs.      Sensation: intact to LT     Reflex Exam:  DTR's:  Antalgic, hipe stiffness Left AJ 1+ right 2+  Toes:    The toes are downgoing bilaterally.   Clonus:    Clonus is absent.    Assessment/Plan: I spent an extended visit discussing patient's symptoms with her and her equally lovely friend today.  Patient has longstanding anxiety, she states that she may have had a panic attack, she was with  a plumber who who told her she had replaced all her plumbing, at that time she felt heat and tingling from her head down to her toes, since then she occasionally has the symptoms in various locations but has had it from the top of her forehead to her toes, symptoms improved with lorazepam.  I discussed physiology of such symptoms likely anxiety however did discuss more imaging with her to be ensure that there is no other etiology, anxiety is a diagnosis of exclusion, however I did discuss the pathophysiology fight or flight sympathetic nervous system, also vasovagal in the setting of stress or other triggers, I reassured patient and her friend, answered questions, also discussed and suggested further evaluation but I do think this is likely panic attack or anxiety and I encouraged her to discuss the lorazepam treatment with Dr. Ouida Sills or possibly other longer-term solutions including cognitive behavioral therapy.  Patient with life long standing anxiety who had a self-reported possible panic attack/ anxiety episode but this is a diagnosis of exclusion and I did recommend/suggest follow-up if patient would like She already had an EMG nerve conduction study of her upper extremities Review the MRI brain : unremarkable MRI  of the cervical spine recommended she would like to wait  Likely some nerve root irritation of the left LE s1 has scarring around it and left hypo AJ which is an S1 reflex maybe chronic, can EMG/NCS on a leg left leg if you would like she would like to hold off Could always MRI cervical spine to ensure no pathology, as above she would like to hold off MRI brain looked fine mild normal age-related changes, discussed and reviewed images with patient and her friend But hopefully this is anxiety and you can f/u with primary care since lorazepam helps If it continues you are welcome back anytime ot you can call to start the above    No orders of the defined types were placed in this  encounter.  No orders of the defined types were placed in this encounter.   Cc: Carylon Perches, MD,  Carylon Perches, MD  Naomie Dean, MD  [facility] [address] [place], [place] [address]  Phone [phone] Fax [phone] I spent over 45 minutes of face-to-face and non-face-to-face time with patient on the  1. Anxiety   2. Paresthesia    diagnosis.  This included previsit chart review, lab review, study review, order entry, electronic health record documentation, patient education on the different diagnostic and therapeutic options, counseling and coordination of care, risks and benefits of management, compliance, or risk factor reduction

## 2022-10-01 NOTE — Patient Instructions (Signed)
  We can always repeat EMG/NCS on a leg left leg if you would like Could always MRI cervical spine to ensure no pathology  MRI brain looked fine mild normal age-related changes But hopefully this is anxiety and you can f/u with primary care since lorazepam helps If it continues you are welcome back anytime ot you can call to start the above

## 2022-10-04 ENCOUNTER — Encounter: Payer: Self-pay | Admitting: Neurology

## 2022-10-05 ENCOUNTER — Ambulatory Visit (HOSPITAL_COMMUNITY): Payer: Medicare PPO | Admitting: Physical Therapy

## 2022-10-05 ENCOUNTER — Other Ambulatory Visit: Payer: Self-pay | Admitting: Internal Medicine

## 2022-10-05 DIAGNOSIS — M25552 Pain in left hip: Secondary | ICD-10-CM | POA: Diagnosis not present

## 2022-10-05 DIAGNOSIS — R29898 Other symptoms and signs involving the musculoskeletal system: Secondary | ICD-10-CM | POA: Diagnosis not present

## 2022-10-05 DIAGNOSIS — R252 Cramp and spasm: Secondary | ICD-10-CM | POA: Diagnosis not present

## 2022-10-05 DIAGNOSIS — M6281 Muscle weakness (generalized): Secondary | ICD-10-CM

## 2022-10-05 DIAGNOSIS — R262 Difficulty in walking, not elsewhere classified: Secondary | ICD-10-CM

## 2022-10-05 NOTE — Therapy (Signed)
OUTPATIENT PHYSICAL THERAPY THORACOLUMBAR TREATMENT   Patient Name: Laurie Bridges MRN: [id number] DOB:[birth year], 77 y.o., female Today's Date: 10/05/2022  END OF SESSION:  PT End of Session - 10/05/22 1300     Visit Number 2    Number of Visits 6    Date for PT Re-Evaluation 11/09/22    Authorization Type [employer]    PT Start Time 1300    PT Stop Time 1340    PT Time Calculation (min) 40 min    Activity Tolerance Patient tolerated treatment well    Behavior During Therapy WFL for tasks assessed/performed             Past Medical History:  Diagnosis Date   Anemia    hx of Fe def many years ago   Anxiety    Arthritis    back   CAD (coronary artery disease)    a. s/p Coronary CT in 06/2018 showing mild stenosis along the mid-LAD, mid-LCx and mid-RCA   GERD (gastroesophageal reflux disease)    Hypertension    PONV (postoperative nausea and vomiting)    Past Surgical History:  Procedure Laterality Date   ABDOMINAL HYSTERECTOMY     partial hysterectomy, ovaries remain   BACK SURGERY     CHOLECYSTECTOMY N/A 11/27/2019   Procedure: LAPAROSCOPIC CHOLECYSTECTOMY;  Surgeon: Lucretia Roers, MD;  Location: [facility];  Service: General;  Laterality: N/A;   COLONOSCOPY N/A 02/29/2020   Procedure: COLONOSCOPY;  Surgeon: Malissa Hippo, MD;  Location: [facility];  Service: Endoscopy;  Laterality: N/A;  8:45   ESOPHAGEAL DILATION N/A 11/17/2017   Procedure: ESOPHAGEAL DILATION;  Surgeon: Malissa Hippo, MD;  Location: [facility];  Service: Endoscopy;  Laterality: N/A;   ESOPHAGOGASTRODUODENOSCOPY N/A 11/17/2017   Procedure: ESOPHAGOGASTRODUODENOSCOPY (EGD);  Surgeon: Malissa Hippo, MD;  [facility]: [facility];  Service: Endoscopy;  Laterality: N/A;  12:00   ESOPHAGOGASTRODUODENOSCOPY N/A 11/02/2019   Procedure: ESOPHAGOGASTRODUODENOSCOPY (EGD);  Surgeon: Malissa Hippo, MD;  [facility]: [facility];  Service: Endoscopy;  Laterality: N/A;  945   FOOT  SURGERY     POLYPECTOMY  02/29/2020   Procedure: POLYPECTOMY;  Surgeon: Malissa Hippo, MD;  Location: [facility];  Service: Endoscopy;;   Patient Active Problem List   Diagnosis Date Noted   Gastroparesis 02/06/2021   Postprandial bloating 11/21/2020   Early satiety 11/21/2020   Gallstones 11/17/2018   Gastroesophageal reflux disease without esophagitis 09/14/2017   Esophageal dysphagia 09/14/2017   Dyspnea 08/02/2013   GERD (gastroesophageal reflux disease) 08/02/2013    PCP: Carylon Perches, MD  REFERRING PROVIDER: Sherryl Manges, NP  REFERRING DIAG: M54.16 (ICD-10-CM) - Radiculopathy, lumbar region  Rationale for Evaluation and Treatment: Rehabilitation  THERAPY DIAG:  Cramp and spasm  Pain in left hip  Difficulty in walking, not elsewhere classified  Muscle weakness (generalized)  Other symptoms and signs involving the musculoskeletal system  ONSET DATE:  2 years ago  SUBJECTIVE:  SUBJECTIVE STATEMENT: Pt states she could tell a little difference after the needling. Reports she has been getting more groin pain -- thinks she may have over stretched. Most bothered with her exercise with her leg off the EOB. Was not too sore after needling. Saw neurologist who told her she had a pinched nerve.   PERTINENT HISTORY:  From H&P: "Complains of left groin and anterior thigh pain. She states that her leg will buckle at times. She has a history of a left hip replacement."   PAIN:  Are you having pain? Yes: NPRS scale: 20 at worst; 0 at rest just "tight"/10 Pain location: Front of left thigh and groin Pain description: tightness Aggravating factors: standing, weight on it Relieving factors: Sitting  PRECAUTIONS: Fall  RED FLAGS: None   WEIGHT BEARING RESTRICTIONS: No  FALLS:   Has patient fallen in last 6 months? Yes. Number of falls 1  LIVING ENVIRONMENT: Lives with: lives alone Lives in: House/apartment Stairs: Yes: External: 3 steps; on right going up Has following equipment at home: None  OCCUPATION: likes to bowl  PLOF: Independent  PATIENT GOALS: Improve balance and pain  NEXT MD VISIT: n/a  OBJECTIVE:   DIAGNOSTIC FINDINGS:  Lumbar MRI 08/02/22 IMPRESSION: Unchanged lumbar disc and facet degeneration without spinal stenosis or compressive neural foraminal stenosis.  PATIENT SURVEYS:  TBA  SCREENING FOR RED FLAGS: Bowel or bladder incontinence: No Spinal tumors: No Cauda equina syndrome: No Compression fracture: No Abdominal aneurysm: No  COGNITION: Overall cognitive status: Within functional limits for tasks assessed     SENSATION: WFL  MUSCLE LENGTH: Hamstrings: did not assess Thomas test: Right 10 deg; Left 0 deg  POSTURE: No Significant postural limitations  PALPATION: TTP L TFL, quad, adductor  LUMBAR ROM:   AROM eval  Flexion   Extension   Right lateral flexion   Left lateral flexion   Right rotation   Left rotation    (Blank rows = not tested)  LOWER EXTREMITY ROM:     Active  Right eval Left eval  Hip flexion >120 110 (feels pinching in hip)  Hip extension 10 0  Hip abduction >45 45  Hip adduction    Hip internal rotation    Hip external rotation    Knee flexion    Knee extension    Ankle dorsiflexion    Ankle plantarflexion    Ankle inversion    Ankle eversion     (Blank rows = not tested)  LOWER EXTREMITY MMT:    MMT Right eval Left eval  Hip flexion 5 5  Hip extension  4  Hip abduction    Hip adduction    Hip internal rotation    Hip external rotation    Knee flexion 5 5  Knee extension 5 4+  Ankle dorsiflexion    Ankle plantarflexion    Ankle inversion    Ankle eversion     (Blank rows = not tested)  LUMBAR SPECIAL TESTS:  FABER test: Positive and Thomas test:  Positive  FUNCTIONAL TESTS:  Did not assess  GAIT: Distance walked: Into clinic Assistive device utilized: None Level of assistance: Complete Independence Comments: Antalgic, L LE slightly extended out, decreased stance time on L  TODAY'S TREATMENT:  DATE:  10/05/22 Prone  Quad stretch with strap x30 sec  Child's pose x10 Sidelying  TFL stretch x30 sec Supine  ITB stretch with strap x30 sec  Butterfly stretch x30 sec  Figure 4 stretch x 30 sec  Piriformis stretch x30 sec  Single leg bridge x10 Seated  Hip flexor stretch x 30 sec Manual therapy  Skilled assessment and palpation for TPDN  STM &TPR TFL Trigger Point Dry-Needling  Treatment instructions: Expect mild to moderate muscle soreness. S/S of pneumothorax if dry needled over a lung field, and to seek immediate medical attention should they occur. Patient verbalized understanding of these instructions and education.  Patient Consent Given: Yes Education handout provided: Yes Muscles treated: L TFL, L proximal quad, L adductor Electrical stimulation performed: No Parameters: N/A Treatment response/outcome: Decreased muscle tension  09/28/22  See HEP below for stretches Manual therapy  Skilled assessment and palpation for TPDN  Trigger Point Dry-Needling  Treatment instructions: Expect mild to moderate muscle soreness. S/S of pneumothorax if dry needled over a lung field, and to seek immediate medical attention should they occur. Patient verbalized understanding of these instructions and education.  Patient Consent Given: Yes Education handout provided: Yes Muscles treated: L TFL, L proximal quad, L adductor Electrical stimulation performed: No Parameters: N/A Treatment response/outcome: Decreased muscle tension    PATIENT EDUCATION:  Education details: Dry needling, exam findings,  POC Person educated:  Patient, friend Education method: Explanation, Facilities manager, and Handouts Education comprehension: verbalized understanding, returned demonstration, and needs further education  HOME EXERCISE PROGRAM: Access Code: [email or web address] URL: [email or web address] Date: 09/28/2022 Prepared by: Vernon Prey April Kirstie Peri  Exercises - Prone Quadriceps Stretch with Strap  - 1 x daily - 7 x weekly - 2 sets - 30 sec hold - Prone Hip Flexor Stretch on Table with Strap  - 1 x daily - 7 x weekly - 2 sets - 30 sec hold - Sidelying ITB Stretch off Table  - 1 x daily - 7 x weekly - 2 sets - 10 reps  Patient Education - Trigger Point Dry Needling  ASSESSMENT:  CLINICAL IMPRESSION: Pt had good response to TPDN. Continued to work on improving hip mobility and flexibility. Added some hip/core strengthening with single leg bridges.   OBJECTIVE IMPAIRMENTS: Abnormal gait, decreased activity tolerance, decreased balance, decreased mobility, difficulty walking, decreased ROM, decreased strength, hypomobility, increased fascial restrictions, increased muscle spasms, impaired flexibility, and pain.   ACTIVITY LIMITATIONS: bending, standing, squatting, stairs, transfers, and locomotion level  PARTICIPATION LIMITATIONS: shopping, community activity, and yard work  PERSONAL FACTORS: Age, Fitness, Past/current experiences, and Time since onset of injury/illness/exacerbation are also affecting patient's functional outcome.   REHAB POTENTIAL: Good  CLINICAL DECISION MAKING: Evolving/moderate complexity  EVALUATION COMPLEXITY: Moderate   GOALS: Goals reviewed with patient? Yes  SHORT TERM GOALS: Target date: 10/19/2022   Pt will be ind with initial HEP Baseline: Goal status: INITIAL  2.  Pt will subjectively report 50% improvement in hip tightness.  Baseline:  Goal status: INITIAL   LONG TERM GOALS: Target date: 11/09/2022   Pt will be ind with management and  progression of HEP Baseline:  Goal status: INITIAL  2.  Pt will be able to tolerate modified Thomas stretch with hip ext of at least 10 deg Baseline: Thomas stretch increased pain in L anterior hip Goal status: INITIAL  3.  Pt will have L = R hip ROM Baseline:  Goal status: INITIAL  4.  Pt will report decrease in pain by >/=75%  Baseline:  Goal status: INITIAL   PLAN:  PT FREQUENCY: 1x/week  PT DURATION: 6 weeks  PLANNED INTERVENTIONS: Therapeutic exercises, Therapeutic activity, Neuromuscular re-education, Balance training, Gait training, Patient/Family education, Self Care, Joint mobilization, Stair training, Aquatic Therapy, Dry Needling, Spinal mobilization, Cryotherapy, Moist heat, Taping, Vasopneumatic device, Traction, Ionotophoresis 4mg /ml Dexamethasone, Manual therapy, and Re-evaluation.  PLAN FOR NEXT SESSION: Assess response to HEP and TPDN. Continue needling, stretching, and joint mobilization of L hip.    Adrianne Shackleton April Ma L Uliana Brinker, PT 10/05/2022, 1:00 PM

## 2022-10-16 ENCOUNTER — Encounter (HOSPITAL_COMMUNITY): Payer: Self-pay | Admitting: Physical Therapy

## 2022-10-16 ENCOUNTER — Ambulatory Visit (HOSPITAL_COMMUNITY): Payer: Medicare PPO | Attending: Student | Admitting: Physical Therapy

## 2022-10-16 DIAGNOSIS — M6281 Muscle weakness (generalized): Secondary | ICD-10-CM | POA: Diagnosis not present

## 2022-10-16 DIAGNOSIS — M25552 Pain in left hip: Secondary | ICD-10-CM | POA: Diagnosis not present

## 2022-10-16 DIAGNOSIS — R262 Difficulty in walking, not elsewhere classified: Secondary | ICD-10-CM | POA: Diagnosis not present

## 2022-10-16 DIAGNOSIS — R29898 Other symptoms and signs involving the musculoskeletal system: Secondary | ICD-10-CM | POA: Diagnosis not present

## 2022-10-16 DIAGNOSIS — R252 Cramp and spasm: Secondary | ICD-10-CM | POA: Diagnosis not present

## 2022-10-16 NOTE — Therapy (Signed)
OUTPATIENT PHYSICAL THERAPY THORACOLUMBAR TREATMENT   Patient Name: LANDYNN MACADAMS MRN: [id number] DOB:09-Jun-1945, 77 y.o., female Today's Date: 10/16/2022  END OF SESSION:  PT End of Session - 10/16/22 0845     Visit Number 3    Number of Visits 6    Date for PT Re-Evaluation 11/09/22    Authorization Type [employer]    PT Start Time 0845    PT Stop Time 0930    PT Time Calculation (min) 45 min    Activity Tolerance Patient tolerated treatment well    Behavior During Therapy [facility] for tasks assessed/performed              Past Medical History:  Diagnosis Date   Anemia    hx of Fe def many years ago   Anxiety    Arthritis    back   CAD (coronary artery disease)    a. s/p Coronary CT in 06/2018 showing mild stenosis along the mid-LAD, mid-LCx and mid-RCA   GERD (gastroesophageal reflux disease)    Hypertension    PONV (postoperative nausea and vomiting)    Past Surgical History:  Procedure Laterality Date   ABDOMINAL HYSTERECTOMY     partial hysterectomy, ovaries remain   BACK SURGERY     CHOLECYSTECTOMY N/A 11/27/2019   Procedure: LAPAROSCOPIC CHOLECYSTECTOMY;  Surgeon: Lucretia Roers, MD;  Location: [facility];  Service: General;  Laterality: N/A;   COLONOSCOPY N/A 02/29/2020   Procedure: COLONOSCOPY;  Surgeon: Malissa Hippo, MD;  [facility];  Service: Endoscopy;  Laterality: N/A;  8:45   ESOPHAGEAL DILATION N/A 11/17/2017   Procedure: ESOPHAGEAL DILATION;  Surgeon: Malissa Hippo, MD;  [facility];  Service: Endoscopy;  Laterality: N/A;   ESOPHAGOGASTRODUODENOSCOPY N/A 11/17/2017   Procedure: ESOPHAGOGASTRODUODENOSCOPY (EGD);  Surgeon: Malissa Hippo, MD;  [facility];  Service: Endoscopy;  Laterality: N/A;  12:00   ESOPHAGOGASTRODUODENOSCOPY N/A 11/02/2019   Procedure: ESOPHAGOGASTRODUODENOSCOPY (EGD);  Surgeon: Malissa Hippo, MD;  [facility];  Service: Endoscopy;  Laterality: N/A;  945   FOOT  SURGERY     POLYPECTOMY  02/29/2020   Procedure: POLYPECTOMY;  Surgeon: Malissa Hippo, MD;  [facility];  Service: Endoscopy;;   Patient Active Problem List   Diagnosis Date Noted   Gastroparesis 02/06/2021   Postprandial bloating 11/21/2020   Early satiety 11/21/2020   Gallstones 11/17/2018   Gastroesophageal reflux disease without esophagitis 09/14/2017   Esophageal dysphagia 09/14/2017   Dyspnea 08/02/2013   GERD (gastroesophageal reflux disease) 08/02/2013    PCP: Carylon Perches, MD  REFERRING PROVIDER: Sherryl Manges, NP  REFERRING DIAG: M54.16 (ICD-10-CM) - Radiculopathy, lumbar region  Rationale for Evaluation and Treatment: Rehabilitation  THERAPY DIAG:  Cramp and spasm  Pain in left hip  Difficulty in walking, not elsewhere classified  Muscle weakness (generalized)  ONSET DATE:  2 years ago  SUBJECTIVE:  SUBJECTIVE STATEMENT: Pt states she can tell it's working. Pt reports she's had 2 instances of grabbing but it's been less intense. Can feel a little difference.   PERTINENT HISTORY:  From H&P: "Complains of left groin and anterior thigh pain. She states that her leg will buckle at times. She has a history of a left hip replacement."   PAIN:  Are you having pain? Yes: NPRS scale: 20 at worst; 0 at rest just "tight"/10 Pain location: Front of left thigh and groin Pain description: tightness Aggravating factors: standing, weight on it Relieving factors: Sitting  PRECAUTIONS: Fall  RED FLAGS: None   WEIGHT BEARING RESTRICTIONS: No  FALLS:  Has patient fallen in last 6 months? Yes. Number of falls 1  LIVING ENVIRONMENT: Lives with: lives alone Lives in: House/apartment Stairs: Yes: External: 3 steps; on right going up Has following equipment at  home: None  OCCUPATION: likes to bowl  PLOF: Independent  PATIENT GOALS: Improve balance and pain  NEXT MD VISIT: n/a  OBJECTIVE:   DIAGNOSTIC FINDINGS:  Lumbar MRI 08/02/22 IMPRESSION: Unchanged lumbar disc and facet degeneration without spinal stenosis or compressive neural foraminal stenosis.  PATIENT SURVEYS:  TBA  COGNITION: Overall cognitive status: Within functional limits for tasks assessed     SENSATION: WFL  MUSCLE LENGTH: Hamstrings: did not assess Thomas test: Right 10 deg; Left 0 deg  POSTURE: No Significant postural limitations  PALPATION: TTP L TFL, quad, adductor  LUMBAR ROM:   AROM eval  Flexion   Extension   Right lateral flexion   Left lateral flexion   Right rotation   Left rotation    (Blank rows = not tested)  LOWER EXTREMITY ROM:     Active  Right eval Left eval  Hip flexion >120 110 (feels pinching in hip)  Hip extension 10 0  Hip abduction >45 45  Hip adduction    Hip internal rotation    Hip external rotation    Knee flexion    Knee extension    Ankle dorsiflexion    Ankle plantarflexion    Ankle inversion    Ankle eversion     (Blank rows = not tested)  LOWER EXTREMITY MMT:    MMT Right eval Left eval  Hip flexion 5 5  Hip extension  4  Hip abduction    Hip adduction    Hip internal rotation    Hip external rotation    Knee flexion 5 5  Knee extension 5 4+  Ankle dorsiflexion    Ankle plantarflexion    Ankle inversion    Ankle eversion     (Blank rows = not tested)  LUMBAR SPECIAL TESTS:  FABER test: Positive and Thomas test: Positive  FUNCTIONAL TESTS:  Did not assess  GAIT: Distance walked: Into clinic Assistive device utilized: None Level of assistance: Complete Independence Comments: Antalgic, L LE slightly extended out, decreased stance time on L  TODAY'S TREATMENT:  DATE:  10/16/22 Prone  Quad stretch with strap x30"  Hip extension with knee flexed 2x10 Seated  Hip flexor stretch on L x30" Standing  Hip ext iso against wall with knee bent 10x5"  Side lunge with hip abd/add slider pull 2x10 Manual therapy  Hip extension PROM  Grade II to III PA mobs for hip ext Skilled assessment and palpation for TPDN  STM &TPR TFL Trigger Point Dry-Needling  Treatment instructions: Expect mild to moderate muscle soreness. S/S of pneumothorax if dry needled over a lung field, and to seek immediate medical attention should they occur. Patient verbalized understanding of these instructions and education.  Patient Consent Given: Yes Education handout provided: Yes Muscles treated: L TFL, L adductor Electrical stimulation performed: No Parameters: N/A Treatment response/outcome: Decreased muscle tension  10/05/22 Prone  Quad stretch with strap x30 sec  Child's pose x10 Sidelying  TFL stretch x30 sec Supine  ITB stretch with strap x30 sec  Butterfly stretch x30 sec  Figure 4 stretch x 30 sec  Piriformis stretch x30 sec  Single leg bridge x10 Seated  Hip flexor stretch x 30 sec Manual therapy  Skilled assessment and palpation for TPDN  STM &TPR TFL Trigger Point Dry-Needling  Treatment instructions: Expect mild to moderate muscle soreness. S/S of pneumothorax if dry needled over a lung field, and to seek immediate medical attention should they occur. Patient verbalized understanding of these instructions and education.  Patient Consent Given: Yes Education handout provided: Yes Muscles treated: L TFL, L proximal quad, L adductor Electrical stimulation performed: No Parameters: N/A Treatment response/outcome: Decreased muscle tension  09/28/22  See HEP below for stretches Manual therapy  Skilled assessment and palpation for TPDN  Trigger Point Dry-Needling  Treatment instructions: Expect mild to moderate muscle soreness. S/S of pneumothorax if  dry needled over a lung field, and to seek immediate medical attention should they occur. Patient verbalized understanding of these instructions and education.  Patient Consent Given: Yes Education handout provided: Yes Muscles treated: L TFL, L proximal quad, L adductor Electrical stimulation performed: No Parameters: N/A Treatment response/outcome: Decreased muscle tension    PATIENT EDUCATION:  Education details: Dry needling, exam findings, POC Person educated:  Patient, friend Education method: Explanation, Facilities manager, and Handouts Education comprehension: verbalized understanding, returned demonstration, and needs further education  HOME EXERCISE PROGRAM: Access Code: [email or web address] URL: [email or web address] Date: 09/28/2022 Prepared by: Vernon Prey April Kirstie Peri  Exercises - Prone Quadriceps Stretch with Strap  - 1 x daily - 7 x weekly - 2 sets - 30 sec hold - Prone Hip Flexor Stretch on Table with Strap  - 1 x daily - 7 x weekly - 2 sets - 30 sec hold - Sidelying ITB Stretch off Table  - 1 x daily - 7 x weekly - 2 sets - 10 reps  Patient Education - Trigger Point Dry Needling  ASSESSMENT:  CLINICAL IMPRESSION: Improving hip mobility. Pt notes less grabbing frequency and decreased intensity of her pain. Challenged with keeping hips/pelvic from rotating forward when attempting to stabilize on L LE and performing side lunge with slider. Continued glute strengthening.   GOALS: Goals reviewed with patient? Yes  SHORT TERM GOALS: Target date: 10/19/2022   Pt will be ind with initial HEP Baseline: Goal status: INITIAL  2.  Pt will subjectively report 50% improvement in hip tightness.  Baseline:  Goal status: INITIAL   LONG TERM GOALS: Target date: 11/09/2022   Pt will be ind with management and progression of HEP Baseline:  Goal status: INITIAL  2.  Pt will be able to tolerate modified Thomas stretch with hip ext of at least 10 deg Baseline:  Thomas stretch increased pain in L anterior hip Goal status: INITIAL  3.  Pt will have L = R hip ROM Baseline:  Goal status: INITIAL  4.  Pt will report decrease in pain by >/=75%  Baseline:  Goal status: INITIAL   PLAN:  PT FREQUENCY: 1x/week  PT DURATION: 6 weeks  PLANNED INTERVENTIONS: Therapeutic exercises, Therapeutic activity, Neuromuscular re-education, Balance training, Gait training, Patient/Family education, Self Care, Joint mobilization, Stair training, Aquatic Therapy, Dry Needling, Spinal mobilization, Cryotherapy, Moist heat, Taping, Vasopneumatic device, Traction, Ionotophoresis 4mg /ml Dexamethasone, Manual therapy, and Re-evaluation.  PLAN FOR NEXT SESSION: Assess response to HEP and TPDN. Continue needling, stretching, and joint mobilization of L hip.    Danyell Awbrey April Ma L Nusrat Encarnacion, PT 10/16/2022, 8:46 AM

## 2022-10-20 ENCOUNTER — Ambulatory Visit (HOSPITAL_COMMUNITY): Payer: Medicare PPO | Admitting: Physical Therapy

## 2022-10-20 DIAGNOSIS — M6281 Muscle weakness (generalized): Secondary | ICD-10-CM

## 2022-10-20 DIAGNOSIS — R29898 Other symptoms and signs involving the musculoskeletal system: Secondary | ICD-10-CM | POA: Diagnosis not present

## 2022-10-20 DIAGNOSIS — R252 Cramp and spasm: Secondary | ICD-10-CM

## 2022-10-20 DIAGNOSIS — M25552 Pain in left hip: Secondary | ICD-10-CM

## 2022-10-20 DIAGNOSIS — R262 Difficulty in walking, not elsewhere classified: Secondary | ICD-10-CM | POA: Diagnosis not present

## 2022-10-20 NOTE — Therapy (Signed)
OUTPATIENT PHYSICAL THERAPY THORACOLUMBAR TREATMENT   Patient Name: Laurie Bridges MRN: [id number] DOB:09-11-1945, 77 y.o., female Today's Date: 10/20/2022  END OF SESSION:  PT End of Session - 10/20/22 0841     Visit Number 4    Number of Visits 6    Date for PT Re-Evaluation 11/09/22    Authorization Type [employer]    PT Start Time 0845    PT Stop Time 0925    PT Time Calculation (min) 40 min    Activity Tolerance Patient tolerated treatment well    Behavior During Therapy [facility] for tasks assessed/performed              Past Medical History:  Diagnosis Date   Anemia    hx of Fe def many years ago   Anxiety    Arthritis    back   CAD (coronary artery disease)    a. s/p Coronary CT in 06/2018 showing mild stenosis along the mid-LAD, mid-LCx and mid-RCA   GERD (gastroesophageal reflux disease)    Hypertension    PONV (postoperative nausea and vomiting)    Past Surgical History:  Procedure Laterality Date   ABDOMINAL HYSTERECTOMY     partial hysterectomy, ovaries remain   BACK SURGERY     CHOLECYSTECTOMY N/A 11/27/2019   Procedure: LAPAROSCOPIC CHOLECYSTECTOMY;  Surgeon: Lucretia Roers, MD;  Location: [facility];  Service: General;  Laterality: N/A;   COLONOSCOPY N/A 02/29/2020   Procedure: COLONOSCOPY;  Surgeon: Malissa Hippo, MD;  Location: [facility];  Service: Endoscopy;  Laterality: N/A;  8:45   ESOPHAGEAL DILATION N/A 11/17/2017   Procedure: ESOPHAGEAL DILATION;  Surgeon: Malissa Hippo, MD;  Location: [facility];  Service: Endoscopy;  Laterality: N/A;   ESOPHAGOGASTRODUODENOSCOPY N/A 11/17/2017   Procedure: ESOPHAGOGASTRODUODENOSCOPY (EGD);  Surgeon: Malissa Hippo, MD;  [facility];  Service: Endoscopy;  Laterality: N/A;  12:00   ESOPHAGOGASTRODUODENOSCOPY N/A 11/02/2019   Procedure: ESOPHAGOGASTRODUODENOSCOPY (EGD);  Surgeon: Malissa Hippo, MD;  [facility];  Service: Endoscopy;  Laterality: N/A;  945   FOOT  SURGERY     POLYPECTOMY  02/29/2020   Procedure: POLYPECTOMY;  Surgeon: Malissa Hippo, MD;  Location: [facility];  Service: Endoscopy;;   Patient Active Problem List   Diagnosis Date Noted   Gastroparesis 02/06/2021   Postprandial bloating 11/21/2020   Early satiety 11/21/2020   Gallstones 11/17/2018   Gastroesophageal reflux disease without esophagitis 09/14/2017   Esophageal dysphagia 09/14/2017   Dyspnea 08/02/2013   GERD (gastroesophageal reflux disease) 08/02/2013    PCP: Carylon Perches, MD  REFERRING PROVIDER: Sherryl Manges, NP  REFERRING DIAG: M54.16 (ICD-10-CM) - Radiculopathy, lumbar region  Rationale for Evaluation and Treatment: Rehabilitation  THERAPY DIAG:  No diagnosis found.  ONSET DATE:  2 years ago  SUBJECTIVE:  SUBJECTIVE STATEMENT: Pt reports stiffness this morning. "I can walk better"  PERTINENT HISTORY:  From H&P: "Complains of left groin and anterior thigh pain. She states that her leg will buckle at times. She has a history of a left hip replacement."   PAIN:  Are you having pain? Yes: NPRS scale: 4 at worst; 0 at rest just "tight"/10 Pain location: Front of left thigh and groin Pain description: tightness Aggravating factors: standing, weight on it Relieving factors: Sitting  PRECAUTIONS: Fall  RED FLAGS: None   WEIGHT BEARING RESTRICTIONS: No  FALLS:  Has patient fallen in last 6 months? Yes. Number of falls 1  LIVING ENVIRONMENT: Lives with: lives alone Lives in: House/apartment Stairs: Yes: External: 3 steps; on right going up Has following equipment at home: None  OCCUPATION: likes to bowl  PLOF: Independent  PATIENT GOALS: Improve balance and pain  NEXT MD VISIT: n/a  OBJECTIVE:   DIAGNOSTIC FINDINGS:  Lumbar MRI 08/02/22  IMPRESSION: Unchanged lumbar disc and facet degeneration without spinal stenosis or compressive neural foraminal stenosis.  PATIENT SURVEYS:  TBA  COGNITION: Overall cognitive status: Within functional limits for tasks assessed     SENSATION: WFL  MUSCLE LENGTH: Hamstrings: did not assess Thomas test: Right 10 deg; Left 0 deg  POSTURE: No Significant postural limitations  PALPATION: TTP L TFL, quad, adductor  LUMBAR ROM:   AROM eval  Flexion   Extension   Right lateral flexion   Left lateral flexion   Right rotation   Left rotation    (Blank rows = not tested)  LOWER EXTREMITY ROM:     Active  Right eval Left eval  Hip flexion >120 110 (feels pinching in hip)  Hip extension 10 0  Hip abduction >45 45  Hip adduction    Hip internal rotation    Hip external rotation    Knee flexion    Knee extension    Ankle dorsiflexion    Ankle plantarflexion    Ankle inversion    Ankle eversion     (Blank rows = not tested)  LOWER EXTREMITY MMT:    MMT Right eval Left eval  Hip flexion 5 5  Hip extension  4  Hip abduction    Hip adduction    Hip internal rotation    Hip external rotation    Knee flexion 5 5  Knee extension 5 4+  Ankle dorsiflexion    Ankle plantarflexion    Ankle inversion    Ankle eversion     (Blank rows = not tested)  LUMBAR SPECIAL TESTS:  FABER test: Positive and Thomas test: Positive  FUNCTIONAL TESTS:  Did not assess  GAIT: Distance walked: Into clinic Assistive device utilized: None Level of assistance: Complete Independence Comments: Antalgic, L LE slightly extended out, decreased stance time on L  TODAY'S TREATMENT:  DATE:  10/20/22 Recumbent bike L4 x 4 min Standing hip flexor stretch on step 3x30" Standing hamstring stretch on step 2x30" R foot on step, glute set + quad set L LE  10x3" Prone  Quad set x10  Knee hang x30  Hip ext 2x10 Supine  Quad set x10 Standing  Hip abd/adductor slide 2x10  Hip ext slide 2x10  Manual therapy  Hip extension PROM  Grade II to III PA mobs for hip ext Skilled assessment and palpation for TPDN  STM &TPR TFL Trigger Point Dry-Needling  Treatment instructions: Expect mild to moderate muscle soreness. S/S of pneumothorax if dry needled over a lung field, and to seek immediate medical attention should they occur. Patient verbalized understanding of these instructions and education.  Patient Consent Given: Yes Education handout provided: Yes Muscles treated: L TFL, hamstring Electrical stimulation performed: No Parameters: N/A Treatment response/outcome: Decreased muscle tension Gait training: 4x100' working on heel/toe, hip extension and knee extension during stance phase (CGA due to pt with minor LOBs that she is able to self correct)  10/16/22 Prone  Quad stretch with strap x30"  Hip extension with knee flexed 2x10 Seated  Hip flexor stretch on L x30" Standing  Hip ext iso against wall with knee bent 10x5"  Side lunge with hip abd/add slider pull 2x10 Manual therapy  Hip extension PROM  Grade II to III PA mobs for hip ext Skilled assessment and palpation for TPDN  STM &TPR TFL Trigger Point Dry-Needling  Treatment instructions: Expect mild to moderate muscle soreness. S/S of pneumothorax if dry needled over a lung field, and to seek immediate medical attention should they occur. Patient verbalized understanding of these instructions and education.  Patient Consent Given: Yes Education handout provided: Yes Muscles treated: L TFL, L adductor Electrical stimulation performed: No Parameters: N/A Treatment response/outcome: Decreased muscle tension  10/05/22 Prone  Quad stretch with strap x30 sec  Child's pose x10 Sidelying  TFL stretch x30 sec Supine  ITB stretch with strap x30 sec  Butterfly stretch x30  sec  Figure 4 stretch x 30 sec  Piriformis stretch x30 sec  Single leg bridge x10 Seated  Hip flexor stretch x 30 sec Manual therapy  Skilled assessment and palpation for TPDN  STM &TPR TFL Trigger Point Dry-Needling  Treatment instructions: Expect mild to moderate muscle soreness. S/S of pneumothorax if dry needled over a lung field, and to seek immediate medical attention should they occur. Patient verbalized understanding of these instructions and education.  Patient Consent Given: Yes Education handout provided: Yes Muscles treated: L TFL, L proximal quad, L adductor Electrical stimulation performed: No Parameters: N/A Treatment response/outcome: Decreased muscle tension  09/28/22  See HEP below for stretches Manual therapy  Skilled assessment and palpation for TPDN  Trigger Point Dry-Needling  Treatment instructions: Expect mild to moderate muscle soreness. S/S of pneumothorax if dry needled over a lung field, and to seek immediate medical attention should they occur. Patient verbalized understanding of these instructions and education.  Patient Consent Given: Yes Education handout provided: Yes Muscles treated: L TFL, L proximal quad, L adductor Electrical stimulation performed: No Parameters: N/A Treatment response/outcome: Decreased muscle tension    PATIENT EDUCATION:  Education details: Dry needling, exam findings, POC Person educated:  Patient, friend Education method: Explanation, Facilities manager, and Handouts Education comprehension: verbalized understanding, returned demonstration, and needs further education  HOME EXERCISE PROGRAM: Access Code: [email or web address] URL: [email or web address] Date: 09/28/2022 Prepared by: Vernon Prey April Kirstie Peri  Exercises - Prone Quadriceps  Stretch with Strap  - 1 x daily - 7 x weekly - 2 sets - 30 sec hold - Prone Hip Flexor Stretch on Table with Strap  - 1 x daily - 7 x weekly - 2 sets - 30 sec hold - Sidelying ITB  Stretch off Table  - 1 x daily - 7 x weekly - 2 sets - 10 reps  Patient Education - Trigger Point Dry Needling  ASSESSMENT:  CLINICAL IMPRESSION: Increased L TFL tightness today addressed with TPDN. Consider TPDN with e-stim next session. Continued to work on hip mobility, strength and stability. Pt keeps L knee flexed likely leading to increased L hip flexion in standing and walking -- added quad sets and hamstring stretching to try and address. Worked on gait to encourage more hip extension and knee extension during stance phase. Very unsteady when performing gait with increased hip extension.    GOALS: Goals reviewed with patient? Yes  SHORT TERM GOALS: Target date: 10/19/2022   Pt will be ind with initial HEP Baseline: Goal status: INITIAL  2.  Pt will subjectively report 50% improvement in hip tightness.  Baseline:  Goal status: INITIAL   LONG TERM GOALS: Target date: 11/09/2022   Pt will be ind with management and progression of HEP Baseline:  Goal status: INITIAL  2.  Pt will be able to tolerate modified Thomas stretch with hip ext of at least 10 deg Baseline: Thomas stretch increased pain in L anterior hip Goal status: INITIAL  3.  Pt will have L = R hip ROM Baseline:  Goal status: INITIAL  4.  Pt will report decrease in pain by >/=75%  Baseline:  Goal status: INITIAL   PLAN:  PT FREQUENCY: 1x/week  PT DURATION: 6 weeks  PLANNED INTERVENTIONS: Therapeutic exercises, Therapeutic activity, Neuromuscular re-education, Balance training, Gait training, Patient/Family education, Self Care, Joint mobilization, Stair training, Aquatic Therapy, Dry Needling, Spinal mobilization, Cryotherapy, Moist heat, Taping, Vasopneumatic device, Traction, Ionotophoresis 4mg /ml Dexamethasone, Manual therapy, and Re-evaluation.  PLAN FOR NEXT SESSION: Assess response to HEP and TPDN. Continue needling (consider with e-stim for L TFL), stretching, and joint mobilization of L hip.  Gait trianing, balance, L hip stabilization/balance   Kavish Lafitte April Ma L Lakeyshia Tuckerman, PT 10/20/2022, 8:48 AM

## 2022-10-27 ENCOUNTER — Ambulatory Visit (HOSPITAL_COMMUNITY): Payer: Medicare PPO

## 2022-10-27 DIAGNOSIS — R29898 Other symptoms and signs involving the musculoskeletal system: Secondary | ICD-10-CM | POA: Diagnosis not present

## 2022-10-27 DIAGNOSIS — R252 Cramp and spasm: Secondary | ICD-10-CM

## 2022-10-27 DIAGNOSIS — R262 Difficulty in walking, not elsewhere classified: Secondary | ICD-10-CM

## 2022-10-27 DIAGNOSIS — M6281 Muscle weakness (generalized): Secondary | ICD-10-CM | POA: Diagnosis not present

## 2022-10-27 DIAGNOSIS — M25552 Pain in left hip: Secondary | ICD-10-CM | POA: Diagnosis not present

## 2022-10-27 NOTE — Therapy (Signed)
OUTPATIENT PHYSICAL THERAPY TREATMENT   Patient Name: Laurie Bridges MRN: [id number] DOB:January 26, 1945, 77 y.o., female Today's Date: 10/27/2022  END OF SESSION:  PT End of Session - 10/27/22 1524     Visit Number 5    Number of Visits 6    Date for PT Re-Evaluation 11/09/22    Authorization Type [employer]    PT Start Time 1515    PT Stop Time 1555    PT Time Calculation (min) 40 min    Activity Tolerance Patient tolerated treatment well    Behavior During Therapy WFL for tasks assessed/performed              Past Medical History:  Diagnosis Date   Anemia    hx of Fe def many years ago   Anxiety    Arthritis    back   CAD (coronary artery disease)    a. s/p Coronary CT in 06/2018 showing mild stenosis along the mid-LAD, mid-LCx and mid-RCA   GERD (gastroesophageal reflux disease)    Hypertension    PONV (postoperative nausea and vomiting)    Past Surgical History:  Procedure Laterality Date   ABDOMINAL HYSTERECTOMY     partial hysterectomy, ovaries remain   BACK SURGERY     CHOLECYSTECTOMY N/A 11/27/2019   Procedure: LAPAROSCOPIC CHOLECYSTECTOMY;  Surgeon: Lucretia Roers, MD;  Location: [facility];  Service: General;  Laterality: N/A;   COLONOSCOPY N/A 02/29/2020   Procedure: COLONOSCOPY;  Surgeon: Malissa Hippo, MD;  [facility];  Service: Endoscopy;  Laterality: N/A;  8:45   ESOPHAGEAL DILATION N/A 11/17/2017   Procedure: ESOPHAGEAL DILATION;  Surgeon: Malissa Hippo, MD;  [facility];  Service: Endoscopy;  Laterality: N/A;   ESOPHAGOGASTRODUODENOSCOPY N/A 11/17/2017   Procedure: ESOPHAGOGASTRODUODENOSCOPY (EGD);  Surgeon: Malissa Hippo, MD;  [facility]: [facility];  Service: Endoscopy;  Laterality: N/A;  12:00   ESOPHAGOGASTRODUODENOSCOPY N/A 11/02/2019   Procedure: ESOPHAGOGASTRODUODENOSCOPY (EGD);  Surgeon: Malissa Hippo, MD;  [facility];  Service: Endoscopy;  Laterality: N/A;  945   FOOT SURGERY      POLYPECTOMY  02/29/2020   Procedure: POLYPECTOMY;  Surgeon: Malissa Hippo, MD;  [facility];  Service: Endoscopy;;   Patient Active Problem List   Diagnosis Date Noted   Gastroparesis 02/06/2021   Postprandial bloating 11/21/2020   Early satiety 11/21/2020   Gallstones 11/17/2018   Gastroesophageal reflux disease without esophagitis 09/14/2017   Esophageal dysphagia 09/14/2017   Dyspnea 08/02/2013   GERD (gastroesophageal reflux disease) 08/02/2013    PCP: Carylon Perches, MD  REFERRING PROVIDER: Sherryl Manges, NP  REFERRING DIAG: M54.16 (ICD-10-CM) - Radiculopathy, lumbar region  Rationale for Evaluation and Treatment: Rehabilitation  THERAPY DIAG:  Cramp and spasm  Muscle weakness (generalized)  Pain in left hip  Difficulty in walking, not elsewhere classified  Other symptoms and signs involving the musculoskeletal system  ONSET DATE:  2 years ago  SUBJECTIVE:  SUBJECTIVE STATEMENT: Pt doing well today, pain controlled, still working on HEP including recent updates.   PERTINENT HISTORY:  Pt referred to OPPT for ongoing complains of left groin and anterior thigh pain ~2 years s/p Left anterior THA. She states that her leg will buckle at times. She has a history of a left hip replacement.    PAIN:  Are you having pain? No   PRECAUTIONS: Fall   WEIGHT BEARING RESTRICTIONS: No  FALLS:  Has patient fallen in last 6 months? Yes. Number of falls 1  OCCUPATION: likes to bowl  PLOF: Independent  PATIENT GOALS: Improve balance and pain  NEXT MD VISIT: n/a  OBJECTIVE:   DIAGNOSTIC FINDINGS:  Lumbar MRI 08/02/22 IMPRESSION: Unchanged lumbar disc and facet degeneration without spinal stenosis or compressive neural foraminal stenosis.  OWER EXTREMITY ROM:      Active  Right eval Right 10/15 Left eval Left  10/15  Hip flexion >120  110 (feels pinching in hip) 113 (anterior pinching)  Hip extension 10  0 --  Hip ER    45  Hip IR     22  Hip abduction >45  45 --  Knee flexion    14-153 degrees   (Blank rows = not tested)  LOWER EXTREMITY MMT:    MMT Right eval Right 10/15 Left eval Left  10/15  Hip flexion 5  5   Hip extension   4   Hip ER      Hip ER       Knee flexion 5  5   Knee extension 5  4+    (Blank rows = not tested)    TODAY'S TREATMENT:                                                                                                                              DATE:   10/27/22: Recumbent bike, seat 10, level 4 x 4 minutes  Muscle assessment; no notable spasm in lateral gluteals, TFL; central quads remain tight but non tender Left SLR in ER (to shift activation away from adductors and back to psoas) 3x5 SAQ c 5lb AW 1x15 bilat, no discernable difference to patient Seated hip IR, band at calves, 1x12 @ YTB loop STS from elevated surface with yellow TB resistance at knees 1x8 (good symmetry)  No need for dry neelding today, all muscles feel great, no taut bands, no spam  10/20/22 Recumbent bike L4 x 4 min Standing hip flexor stretch on step 3x30" Standing hamstring stretch on step 2x30" R foot on step, glute set + quad set L LE 10x3" Prone  Quad set x10  Knee hang x30  Hip ext 2x10 Supine  Quad set x10 Standing  Hip abd/adductor slide 2x10  Hip ext slide 2x10  Manual therapy  Hip extension PROM  Grade II to III PA mobs for hip ext Skilled assessment and palpation for TPDN  STM &TPR TFL Trigger Point Dry-Needling  Treatment instructions: Expect mild to moderate muscle soreness. S/S of pneumothorax if dry needled over a lung field, and to seek immediate medical attention should they occur. Patient verbalized understanding of these instructions and education.  Patient Consent Given: Yes Education handout  provided: Yes Muscles treated: L TFL, hamstring Electrical stimulation performed: No Parameters: N/A Treatment response/outcome: Decreased muscle tension Gait training: 4x100' working on heel/toe, hip extension and knee extension during stance phase (CGA due to pt with minor LOBs that she is able to self correct)  10/16/22 Prone  Quad stretch with strap x30"  Hip extension with knee flexed 2x10 Seated  Hip flexor stretch on L x30" Standing  Hip ext iso against wall with knee bent 10x5"  Side lunge with hip abd/add slider pull 2x10 Manual therapy  Hip extension PROM  Grade II to III PA mobs for hip ext Skilled assessment and palpation for TPDN  STM &TPR TFL Trigger Point Dry-Needling  Treatment instructions: Expect mild to moderate muscle soreness. S/S of pneumothorax if dry needled over a lung field, and to seek immediate medical attention should they occur. Patient verbalized understanding of these instructions and education.  Patient Consent Given: Yes Education handout provided: Yes Muscles treated: L TFL, L adductor Electrical stimulation performed: No Parameters: N/A Treatment response/outcome: Decreased muscle tension  10/05/22 Prone  Quad stretch with strap x30 sec  Child's pose x10 Sidelying  TFL stretch x30 sec Supine  ITB stretch with strap x30 sec  Butterfly stretch x30 sec  Figure 4 stretch x 30 sec  Piriformis stretch x30 sec  Single leg bridge x10 Seated  Hip flexor stretch x 30 sec Manual therapy  Skilled assessment and palpation for TPDN  STM &TPR TFL Trigger Point Dry-Needling  Treatment instructions: Expect mild to moderate muscle soreness. S/S of pneumothorax if dry needled over a lung field, and to seek immediate medical attention should they occur. Patient verbalized understanding of these instructions and education.  Patient Consent Given: Yes Education handout provided: Yes Muscles treated: L TFL, L proximal quad, L adductor Electrical  stimulation performed: No Parameters: N/A Treatment response/outcome: Decreased muscle tension     PATIENT EDUCATION:  Education details: Dry needling, exam findings, POC Person educated:  Patient, friend Education method: Explanation, Facilities manager, and Handouts Education comprehension: verbalized understanding, returned demonstration, and needs further education  HOME EXERCISE PROGRAM: Access Code: [email or web address] URL: [email or web address] Date: 09/28/2022 Prepared by: Vernon Prey April Kirstie Peri  Exercises - Prone Quadriceps Stretch with Strap  - 1 x daily - 7 x weekly - 2 sets - 30 sec hold - Prone Hip Flexor Stretch on Table with Strap  - 1 x daily - 7 x weekly - 2 sets - 30 sec hold - Sidelying ITB Stretch off Table  - 1 x daily - 7 x weekly - 2 sets - 10 reps  Patient Education - Trigger Point Dry Needling  ASSESSMENT:  CLINICAL IMPRESSION: Pt continues to make progress toward LT goals. Pain is improving as is load tolerance. WIll plan to update HEP next session. Knee extension ROM remains most limiting, possibly triggering for buckling episodes.   GOALS: Goals reviewed with patient? Yes  SHORT TERM GOALS: Target date: 10/19/2022   Pt will be ind with initial HEP Baseline: Goal status: INITIAL  2.  Pt will subjectively report 50% improvement in hip tightness.  Baseline:  Goal status: INITIAL   LONG TERM GOALS: Target date: 11/09/2022   Pt will be ind with management and progression of HEP Baseline:  Goal status: INITIAL  2.  Pt will be able to tolerate modified Thomas stretch with hip ext of at least 10 deg Baseline: Thomas stretch increased pain in L anterior hip Goal status: INITIAL  3.  Pt will have L = R hip ROM Baseline:  Goal status: INITIAL  4.  Pt will report decrease in pain by >/=75%  Baseline:  Goal status: INITIAL   PLAN:  PT FREQUENCY: 1x/week  PT DURATION: 6 weeks  PLANNED INTERVENTIONS: Therapeutic exercises,  Therapeutic activity, Neuromuscular re-education, Balance training, Gait training, Patient/Family education, Self Care, Joint mobilization, Stair training, Aquatic Therapy, Dry Needling, Spinal mobilization, Cryotherapy, Moist heat, Taping, Vasopneumatic device, Traction, Ionotophoresis 4mg /ml Dexamethasone, Manual therapy, and Re-evaluation.  PLAN FOR NEXT SESSION: Assess response to HEP and TPDN. Continue needling (consider with e-stim for L TFL), stretching, and joint mobilization of L hip. Gait trianing, balance, L hip stabilization/balance   Barnell Shieh C, PT 10/27/2022, 3:25 PM  Rosamaria Lints, PT Physical Therapist [facility] [place]  [phone]

## 2022-10-28 DIAGNOSIS — N1831 Chronic kidney disease, stage 3a: Secondary | ICD-10-CM | POA: Diagnosis not present

## 2022-10-28 DIAGNOSIS — E611 Iron deficiency: Secondary | ICD-10-CM | POA: Diagnosis not present

## 2022-10-28 DIAGNOSIS — R202 Paresthesia of skin: Secondary | ICD-10-CM | POA: Diagnosis not present

## 2022-11-03 ENCOUNTER — Ambulatory Visit (HOSPITAL_COMMUNITY): Payer: Medicare PPO

## 2022-11-03 DIAGNOSIS — M6281 Muscle weakness (generalized): Secondary | ICD-10-CM

## 2022-11-03 DIAGNOSIS — R29898 Other symptoms and signs involving the musculoskeletal system: Secondary | ICD-10-CM

## 2022-11-03 DIAGNOSIS — R262 Difficulty in walking, not elsewhere classified: Secondary | ICD-10-CM

## 2022-11-03 DIAGNOSIS — R252 Cramp and spasm: Secondary | ICD-10-CM

## 2022-11-03 DIAGNOSIS — M25552 Pain in left hip: Secondary | ICD-10-CM | POA: Diagnosis not present

## 2022-11-03 NOTE — Therapy (Signed)
OUTPATIENT PHYSICAL THERAPY TREATMENT   Patient Name: SHATOYA FINK MRN: [id number] DOB:04/21/1945, 77 y.o., female Today's Date: 11/03/2022  END OF SESSION:  PT End of Session - 11/03/22 1149     Visit Number 6    Number of Visits 6    Date for PT Re-Evaluation 11/09/22    Authorization Type [employer]    PT Start Time 1145    PT Stop Time 1225    PT Time Calculation (min) 40 min    Activity Tolerance Patient tolerated treatment well    Behavior During Therapy WFL for tasks assessed/performed              Past Medical History:  Diagnosis Date   Anemia    hx of Fe def many years ago   Anxiety    Arthritis    back   CAD (coronary artery disease)    a. s/p Coronary CT in 06/2018 showing mild stenosis along the mid-LAD, mid-LCx and mid-RCA   GERD (gastroesophageal reflux disease)    Hypertension    PONV (postoperative nausea and vomiting)    Past Surgical History:  Procedure Laterality Date   ABDOMINAL HYSTERECTOMY     partial hysterectomy, ovaries remain   BACK SURGERY     CHOLECYSTECTOMY N/A 11/27/2019   Procedure: LAPAROSCOPIC CHOLECYSTECTOMY;  Surgeon: Lucretia Roers, MD;  Location: [facility];  Service: General;  Laterality: N/A;   COLONOSCOPY N/A 02/29/2020   Procedure: COLONOSCOPY;  Surgeon: Malissa Hippo, MD;  [facility];  Service: Endoscopy;  Laterality: N/A;  8:45   ESOPHAGEAL DILATION N/A 11/17/2017   Procedure: ESOPHAGEAL DILATION;  Surgeon: Malissa Hippo, MD;  [facility];  Service: Endoscopy;  Laterality: N/A;   ESOPHAGOGASTRODUODENOSCOPY N/A 11/17/2017   Procedure: ESOPHAGOGASTRODUODENOSCOPY (EGD);  Surgeon: Malissa Hippo, MD;  [facility];  Service: Endoscopy;  Laterality: N/A;  12:00   ESOPHAGOGASTRODUODENOSCOPY N/A 11/02/2019   Procedure: ESOPHAGOGASTRODUODENOSCOPY (EGD);  Surgeon: Malissa Hippo, MD;  [facility];  Service: Endoscopy;  Laterality: N/A;  945   FOOT SURGERY      POLYPECTOMY  02/29/2020   Procedure: POLYPECTOMY;  Surgeon: Malissa Hippo, MD;  [facility];  Service: Endoscopy;;   Patient Active Problem List   Diagnosis Date Noted   Gastroparesis 02/06/2021   Postprandial bloating 11/21/2020   Early satiety 11/21/2020   Gallstones 11/17/2018   Gastroesophageal reflux disease without esophagitis 09/14/2017   Esophageal dysphagia 09/14/2017   Dyspnea 08/02/2013   GERD (gastroesophageal reflux disease) 08/02/2013    PCP: Carylon Perches, MD  REFERRING PROVIDER: Sherryl Manges, NP  REFERRING DIAG: M54.16 (ICD-10-CM) - Radiculopathy, lumbar region  Rationale for Evaluation and Treatment: Rehabilitation  THERAPY DIAG:  Cramp and spasm  Muscle weakness (generalized)  Pain in left hip  Difficulty in walking, not elsewhere classified  Other symptoms and signs involving the musculoskeletal system  ONSET DATE:  2 years ago  SUBJECTIVE:  SUBJECTIVE STATEMENT: Pt doing well today, pain controlled, still working on HEP including recent updates. No buckling episodes.   PERTINENT HISTORY:  Pt referred to OPPT for ongoing complains of left groin and anterior thigh pain ~2 years s/p Left anterior THA. She states that her leg will buckle at times. She has a history of a left hip replacement.    PAIN:  Are you having pain? No   PRECAUTIONS: Fall   WEIGHT BEARING RESTRICTIONS: No  FALLS:  Has patient fallen in last 6 months? Yes. Number of falls 1  OCCUPATION: likes to bowl  PLOF: Independent  PATIENT GOALS: Improve balance and pain  NEXT MD VISIT: n/a  OBJECTIVE:   DIAGNOSTIC FINDINGS:  Lumbar MRI 08/02/22 IMPRESSION: Unchanged lumbar disc and facet degeneration without spinal stenosis or compressive neural foraminal  stenosis.  OWER EXTREMITY ROM:     Active  Right eval Right 10/15 Left eval Left  10/15 Left 10/22  Hip flexion >120  110 (feels pinching in hip) 113 (anterior pinching)   Hip extension 10  0 -- ~5 degrees  Hip ER    45   Hip IR     22   Hip abduction >45  45 --   Knee flexion    14-153 degrees    (Blank rows = not tested)  LOWER EXTREMITY MMT:    MMT Right eval Left Eval Right 10/22 Left 10/22  Hip flexion 5 5 5/5 5/5  Hip extension  4    Hip ER   5/5 5/5  Hip horizaontal ABDCT   5/5 5/5  Hip horizontal ADD   5/5 5/5  Hip IR    5-/5 5-/5  Hip ABDCT    4-/5 3+/5  Knee flexion 5 5    Knee extension 5 4+     (Blank rows = not tested)    TODAY'S TREATMENT:                                                                                                                              DATE:   11/03/22 -Recumbent bike, seat 10, level 4 x 4 minutes  -MMT, ROM assessment  -Thomas Test Stretch 2x30 seconds  -MFR to taut and edematous Left quads (no trestricting ROM of knee, nor perceived strength/effort per assessment last visit)  -Left SLR in ER 3x8 (working on increased psoas and decreased pectineus)     PATIENT EDUCATION:  Education details: Dry needling, exam findings, POC Person educated:  Patient, friend Education method: Programmer, multimedia, Facilities manager, and Handouts Education comprehension: verbalized understanding, returned demonstration, and needs further education  HOME EXERCISE PROGRAM: Access Code: [email or web address] URL: [email or web address] Date: 09/28/2022 Prepared by: Vernon Prey April Kirstie Peri  Exercises - Prone Quadriceps Stretch with Strap  - 1 x daily - 7 x weekly - 2 sets - 30 sec hold - Prone Hip Flexor Stretch on Table with Strap  - 1 x daily - 7 x weekly - 2 sets -  30 sec hold - Sidelying ITB Stretch off Table  - 1 x daily - 7 x weekly - 2 sets - 10 reps  Patient Education - Trigger Point Dry Needling  ASSESSMENT:  CLINICAL  IMPRESSION: Pt continues to make progress toward LT goals. Pain is improving as is load tolerance. WIll plan to update HEP next session. Will repeat FOTO survey and final assessment. Knee extension ROM remains most limiting, possibly triggering for buckling episodes. Pt has left foot plantar numbness afte rtime in supine at end of session, wants help distinguishing between reports from ortho and neurosurgical in part. Will plan to screen for piriformis syndrome v L5 /S1 radicular issue.  GOALS: Goals reviewed with patient? Yes  SHORT TERM GOALS: Target date: 10/19/2022   Pt will be ind with initial HEP Baseline: Goal status: INITIAL  2.  Pt will subjectively report 50% improvement in hip tightness.  Baseline:  Goal status: INITIAL   LONG TERM GOALS: Target date: 11/09/2022  Pt will be ind with management and progression of HEP Baseline:  Goal status: INITIAL  2.  Pt will be able to tolerate modified Thomas stretch with hip ext of at least 10 deg Baseline: Thomas stretch increased pain in L anterior hip Goal status: INITIAL  3.  Pt will have L = R hip ROM Baseline:  Goal status: INITIAL  4.  Pt will report decrease in pain by >/=75%  Baseline:  Goal status: INITIAL  PLAN:  PT FREQUENCY: 1x/week  PT DURATION: 6 weeks  PLANNED INTERVENTIONS: Therapeutic exercises, Therapeutic activity, Neuromuscular re-education, Balance training, Gait training, Patient/Family education, Self Care, Joint mobilization, Stair training, Aquatic Therapy, Dry Needling, Spinal mobilization, Cryotherapy, Moist heat, Taping, Vasopneumatic device, Traction, Ionotophoresis 4mg /ml Dexamethasone, Manual therapy, and Re-evaluation.  PLAN FOR NEXT SESSION: stretching, gait trianing, balance, L hip stabilization/balance   Khalidah Herbold C, PT 11/03/2022, 11:49 AM  Rosamaria Lints, PT Physical Therapist [facility]  [phone]   Rosamaria Lints, PT Physical  Therapist [facility] [place]  [phone]

## 2022-11-04 DIAGNOSIS — R202 Paresthesia of skin: Secondary | ICD-10-CM | POA: Diagnosis not present

## 2022-11-04 DIAGNOSIS — F419 Anxiety disorder, unspecified: Secondary | ICD-10-CM | POA: Diagnosis not present

## 2022-11-09 ENCOUNTER — Ambulatory Visit (HOSPITAL_COMMUNITY): Payer: Medicare PPO

## 2022-11-09 DIAGNOSIS — R29898 Other symptoms and signs involving the musculoskeletal system: Secondary | ICD-10-CM | POA: Diagnosis not present

## 2022-11-09 DIAGNOSIS — M6281 Muscle weakness (generalized): Secondary | ICD-10-CM | POA: Diagnosis not present

## 2022-11-09 DIAGNOSIS — R252 Cramp and spasm: Secondary | ICD-10-CM

## 2022-11-09 DIAGNOSIS — R262 Difficulty in walking, not elsewhere classified: Secondary | ICD-10-CM | POA: Diagnosis not present

## 2022-11-09 DIAGNOSIS — M25552 Pain in left hip: Secondary | ICD-10-CM | POA: Diagnosis not present

## 2022-11-09 NOTE — Therapy (Signed)
OUTPATIENT PHYSICAL THERAPY TREATMENT/Discahrge   Patient Name: Laurie Bridges MRN: [id number] DOB:18-Nov-1945, 77 y.o., female Today's Date: 11/09/2022  END OF SESSION:  PT End of Session - 11/09/22 1154     Visit Number 7    Number of Visits 6    Date for PT Re-Evaluation 11/09/22    Authorization Type [employer]    PT Start Time 1135    PT Stop Time 1215    PT Time Calculation (min) 40 min    Activity Tolerance Patient tolerated treatment well    Behavior During Therapy WFL for tasks assessed/performed              Past Medical History:  Diagnosis Date   Anemia    hx of Fe def many years ago   Anxiety    Arthritis    back   CAD (coronary artery disease)    a. s/p Coronary CT in 06/2018 showing mild stenosis along the mid-LAD, mid-LCx and mid-RCA   GERD (gastroesophageal reflux disease)    Hypertension    PONV (postoperative nausea and vomiting)    Past Surgical History:  Procedure Laterality Date   ABDOMINAL HYSTERECTOMY     partial hysterectomy, ovaries remain   BACK SURGERY     CHOLECYSTECTOMY N/A 11/27/2019   Procedure: LAPAROSCOPIC CHOLECYSTECTOMY;  Surgeon: Lucretia Roers, MD;  Location: [facility];  Service: General;  Laterality: N/A;   COLONOSCOPY N/A 02/29/2020   Procedure: COLONOSCOPY;  Surgeon: Malissa Hippo, MD;  [facility];  Service: Endoscopy;  Laterality: N/A;  8:45   ESOPHAGEAL DILATION N/A 11/17/2017   Procedure: ESOPHAGEAL DILATION;  Surgeon: Malissa Hippo, MD;  [facility];  Service: Endoscopy;  Laterality: N/A;   ESOPHAGOGASTRODUODENOSCOPY N/A 11/17/2017   Procedure: ESOPHAGOGASTRODUODENOSCOPY (EGD);  Surgeon: Malissa Hippo, MD;  [facility];  Service: Endoscopy;  Laterality: N/A;  12:00   ESOPHAGOGASTRODUODENOSCOPY N/A 11/02/2019   Procedure: ESOPHAGOGASTRODUODENOSCOPY (EGD);  Surgeon: Malissa Hippo, MD;  [facility];  Service: Endoscopy;  Laterality: N/A;  945   FOOT  SURGERY     POLYPECTOMY  02/29/2020   Procedure: POLYPECTOMY;  Surgeon: Malissa Hippo, MD;  [facility];  Service: Endoscopy;;   Patient Active Problem List   Diagnosis Date Noted   Gastroparesis 02/06/2021   Postprandial bloating 11/21/2020   Early satiety 11/21/2020   Gallstones 11/17/2018   Gastroesophageal reflux disease without esophagitis 09/14/2017   Esophageal dysphagia 09/14/2017   Dyspnea 08/02/2013   GERD (gastroesophageal reflux disease) 08/02/2013    PCP: Carylon Perches, MD  REFERRING PROVIDER: Sherryl Manges, NP  REFERRING DIAG: M54.16 (ICD-10-CM) - Radiculopathy, lumbar region  Rationale for Evaluation and Treatment: Rehabilitation  THERAPY DIAG:  Cramp and spasm  Muscle weakness (generalized)  Pain in left hip  Difficulty in walking, not elsewhere classified  Other symptoms and signs involving the musculoskeletal system  ONSET DATE:  2 years ago  SUBJECTIVE:  SUBJECTIVE STATEMENT: Pt doing well today, still working on HEP including recent updates. No buckling episodes. Pt ready to transition to home based program.   PERTINENT HISTORY:  Pt referred to OPPT for ongoing complains of left groin and anterior thigh pain ~2 years s/p Left anterior THA. She states that her leg will buckle at times. She has a history of a left hip replacement.    PAIN:  Are you having pain? No   PRECAUTIONS: Fall   WEIGHT BEARING RESTRICTIONS: No  FALLS:  Has patient fallen in last 6 months? Yes. Number of falls 1  OCCUPATION: likes to bowl  PLOF: Independent  PATIENT GOALS: Improve balance and pain  NEXT MD VISIT: n/a  OBJECTIVE:   DIAGNOSTIC FINDINGS:  Lumbar MRI 08/02/22 IMPRESSION: Unchanged lumbar disc and facet degeneration without spinal stenosis or  compressive neural foraminal stenosis.  OWER EXTREMITY ROM:     Active  Right eval Right 10/15 Left eval Left  10/15 Left 10/22  Hip flexion >120  110 (feels pinching in hip) 113 (anterior pinching)   Hip extension 10  0 -- ~5 degrees  Hip ER    45   Hip IR     22   Hip abduction >45  45 --   Knee flexion    14-153 degrees    (Blank rows = not tested)  LOWER EXTREMITY MMT:    MMT Right eval Left Eval Right 10/22 Left 10/22  Hip flexion 5 5 5/5 5/5  Hip extension  4    Hip ER   5/5 5/5  Hip horizaontal ABDCT   5/5 5/5  Hip horizontal ADD   5/5 5/5  Hip IR    5-/5 5-/5  Hip ABDCT    4-/5 3+/5  Knee flexion 5 5    Knee extension 5 4+     (Blank rows = not tested)    TODAY'S TREATMENT:                                                                                                                              DATE:   11/03/22 -Recumbent bike, seat 10, level 4 x 4 minutes  -Lunge Stretch on stairs 3x30sec bilat -5xSTS: 15.6 sec (Rt leg dominant, unaware, took time to fix)  -5xSTS: 12.56 sec hands free, symmetrical  -STS x10 withhands on knees  -Standing hip ABDCT 1x12 bilat  -Standing hip extension 1x12 bilat    PATIENT EDUCATION:  Education details: updated HEP for DC (removed all lumbar lordosis aggravating activities from program) Person educated:  Patient,  Education method: Explanation, Demonstration, and Handouts Education comprehension: verbalized understanding, returned demonstration, and needs further education  HOME EXERCISE PROGRAM: Access Code: [email or web address] URL: [email or web address] Date: 11/09/2022 Prepared by: Alvera Novel  Exercises - Hip Flexor Stretch on Step  - 3 x weekly - 3 reps - 30 hold - Sit to Stand with Arms Crossed  - 3 x weekly - 2 sets -  12 reps - Standing Hip Abduction with Counter Support  - 3 x weekly - 2 sets - 12 reps - Standing Hip Extension with Counter Support  - 3 x weekly - 2 sets - 12  reps  ASSESSMENT:  CLINICAL IMPRESSION: Reviewed goals of care today, reassessment completed. Pt is now ready for DC. Updated her HEP to continued with activation and ROM of bilat hips but now keeping all lumbar spine parts isometric. Pt performs all here in session with cues. Pt ready for DC at this time.   GOALS: Goals reviewed with patient? Yes  SHORT TERM GOALS: Target date: 10/19/2022   Pt will be ind with initial HEP Baseline: Goal status: achieved  2.  Pt will subjectively report 50% improvement in hip tightness.  Baseline:  11/09/22: "at least 50%" Goal status: achieved   LONG TERM GOALS: Target date: 11/09/2022  Pt will be ind with management and progression of HEP Baseline:  Goal status: achieved   2.  Pt will be able to tolerate modified Thomas stretch with hip ext of at least 10 deg Baseline: Thomas stretch increased pain in L anterior hip Goal status: achieved   3.  Pt will have L = R hip ROM Baseline:  Goal status: no longer applicable   4.  Pt will report decrease in pain by >/=75%  Baseline: Pt reports pain not any better  Goal status: Not met  PLAN:  PT FREQUENCY: 1x/week  PT DURATION: 6 weeks  PLANNED INTERVENTIONS: Therapeutic exercises, Therapeutic activity, Neuromuscular re-education, Balance training, Gait training, Patient/Family education, Self Care, Joint mobilization, Stair training, Aquatic Therapy, Dry Needling, Spinal mobilization, Cryotherapy, Moist heat, Taping, Vasopneumatic device, Traction, Ionotophoresis 4mg /ml Dexamethasone, Manual therapy, and Re-evaluation.  PLAN FOR NEXT SESSION:   Rosamaria Lints, PT 11/09/2022, 11:56 AM  Rosamaria Lints, PT Physical Therapist [facility] [place]  [phone]

## 2022-11-12 DIAGNOSIS — H04123 Dry eye syndrome of bilateral lacrimal glands: Secondary | ICD-10-CM | POA: Diagnosis not present

## 2022-11-26 DIAGNOSIS — H25813 Combined forms of age-related cataract, bilateral: Secondary | ICD-10-CM | POA: Diagnosis not present

## 2022-11-26 DIAGNOSIS — H01002 Unspecified blepharitis right lower eyelid: Secondary | ICD-10-CM | POA: Diagnosis not present

## 2022-11-26 DIAGNOSIS — H01004 Unspecified blepharitis left upper eyelid: Secondary | ICD-10-CM | POA: Diagnosis not present

## 2022-11-26 DIAGNOSIS — H01001 Unspecified blepharitis right upper eyelid: Secondary | ICD-10-CM | POA: Diagnosis not present

## 2022-12-04 ENCOUNTER — Other Ambulatory Visit: Payer: Self-pay | Admitting: Internal Medicine

## 2022-12-07 NOTE — Telephone Encounter (Signed)
Seen 09/2021 in [place], Needs apt, no follow instructions given

## 2022-12-07 NOTE — Telephone Encounter (Signed)
Left message for the pt to call back... I sent her a My Chart message to ask her to please call and make an appt.. I offered the [place] or [place].

## 2022-12-08 ENCOUNTER — Telehealth: Payer: Self-pay | Admitting: Internal Medicine

## 2022-12-08 NOTE — Telephone Encounter (Signed)
Left a message for the pt to call back... pt needs ROV here or in [place].    Last LDL 09/2021 69.

## 2022-12-08 NOTE — Telephone Encounter (Signed)
Labs received under media but most recent at Dr Alonza Smoker office is 04/2022.

## 2022-12-08 NOTE — Telephone Encounter (Signed)
Pt c/o medication issue:  1. Name of Medication: rosuvastatin (CRESTOR) 40 MG tablet   2. How are you currently taking this medication (dosage and times per day)? TAKE ONE TABLET BY MOUTH EVERY DAY   3. Are you having a reaction (difficulty breathing--STAT)? No  4. What is your medication issue? Patient is calling because this medication has not been working well with her body. Patient stated her cholesterol has gone down, but she thinks the dosage amount may be too strong. Please advise.

## 2022-12-08 NOTE — Telephone Encounter (Signed)
Pt says she has noticed that she has aching in her joints... it comes and goes but lately has been more constant.. she says she thinks it is the rosuvastatin... she has held it for a week on her own and has had some improvement  but it is not fully gone.   She will continue to hold and let us know in the next week how she is doing.   Pt needs OV and I made her a 02/2023 OV but will move her sooner if we get an opening but pt says outside of her aches she is doing well.   Pt had recent labs with DR Ouida Sills.. will request.   Will forward to Dr Tenny Craw to see if any labs prior to seeing her.

## 2022-12-17 ENCOUNTER — Ambulatory Visit (INDEPENDENT_AMBULATORY_CARE_PROVIDER_SITE_OTHER): Payer: Medicare PPO | Admitting: Gastroenterology

## 2022-12-17 ENCOUNTER — Encounter (INDEPENDENT_AMBULATORY_CARE_PROVIDER_SITE_OTHER): Payer: Self-pay | Admitting: Gastroenterology

## 2022-12-17 VITALS — BP 124/71 | HR 79 | Temp 97.9°F | Ht 67.0 in | Wt 151.2 lb

## 2022-12-17 DIAGNOSIS — R1013 Epigastric pain: Secondary | ICD-10-CM

## 2022-12-17 DIAGNOSIS — R790 Abnormal level of blood mineral: Secondary | ICD-10-CM

## 2022-12-17 DIAGNOSIS — E611 Iron deficiency: Secondary | ICD-10-CM

## 2022-12-17 DIAGNOSIS — R110 Nausea: Secondary | ICD-10-CM

## 2022-12-17 MED ORDER — ONDANSETRON HCL 4 MG PO TABS: 4.0000 mg | ORAL_TABLET | Freq: Three times a day (TID) | ORAL | 1 refills | Status: DC | PRN

## 2022-12-17 NOTE — Patient Instructions (Signed)
We will get you scheduled for upper endoscopy for further evaluation I will get labs from Dr. Ouida Sills to review, as discussed, given your iron deficiency, we may need to proceed with a colonoscopy later on if upper endoscopy is not revealing. Please continue iron pills as directed by PCP for now I will send zofran 4mg  to take every 8 hours as needed for nausea  Follow up 3 months  It was a pleasure to see you today. I want to create trusting relationships with patients and provide genuine, compassionate, and quality care. I truly value your feedback! please be on the lookout for a survey regarding your visit with me today. I appreciate your input about our visit and your time in completing this!    Gerhart Ruggieri L. Jeanmarie Hubert, MSN, APRN, AGNP-C Adult-Gerontology Nurse Practitioner [facility] at [facility]

## 2022-12-17 NOTE — H&P (View-Only) (Signed)
 Referring Provider: Carylon Perches, MD Primary Care Physician:  Carylon Perches, MD Primary GI Physician: Previously Dr. Karilyn Cota  Chief Complaint  Patient presents with   GASTROPARESIS    Follow up on gastroparesis. Has been having nausea. Started 3 -4 weeks ago.    HPI:   Laurie Bridges is a 77 y.o. female with past medical history of anemia, anxiety, arthritis, CAD, GERD, HTN, gatroparesis    Patient presenting today for nausea and epigastric discomfort   Last seen February 2024, at that time GERD well-controlled on Dexilant 60 mg daily.  Taking Reglan once daily for gastroparesis, denied any vomiting, occasional bloating but for the most part doing smaller meals and having no issues.  Reported she can go up to 3 days without a bowel movement, taking MiraLAX as needed.  Patient recommended to continue Dexilant 60 mg daily, can trial off of Reglan for 1 to 2 weeks but should resume if having symptoms.  Continue smaller meals, continue MiraLAX as needed and continue good water intake.  Present:  Patient states nausea for the past 3-4 weeks, she has also had some abdominal discomfort as well but feels this began a few months ago. She did have a respiratory infection which she took a course of doxycycline for, nausea seemed to begin after this. She notes that her appetite is not great and she is not eating as much now. She had stopped her reglan as she was doing well but recently restarted this and does not feel this has helped any. She notes some upper abdominal pain that does not seem to be effected by eating. Denies heartburn or acid regurgitation. She is on a z pak now. Denies any recent steroids. Not taking any NSAIDs other than baby ASA. She notes some intermittent looser stools since being on antibiotics, reports she was going more on doxycycline but this has slowed down, she is having solid stools but sometimes looser stools thereafter.   She was started on PO iron by her PCP in September  as she reports her ferritin had dropped down to 23. Notes stools have been dark since starting iron, she notes that stools have been dark/green, she has not noticed any BRBPR.    GES: 12/02/20: Normal gastric emptying in the first 3 hours with slight decrease in the fourth hour. Last Colonoscopy:- 02/2020 One diminutive polyp in the ascending colon.                            Biopsied-TA                            - External hemorrhoids.                           - Anal papilla(e) were hypertrophied.   Last Endoscopy:2021  - Normal hypopharynx.                           - Normal esophagus.                           - Z-line irregular, 35 cm from the incisors.                           - 2 cm hiatal hernia.                           -  Normal stomach.                           - Normal duodenal bulb, second portion of the                            duodenum and major papilla.                           - No specimens collected.   Recommendations:  Repeat colonoscopy 7 years   Past Medical History:  Diagnosis Date   Anemia    hx of Fe def many years ago   Anxiety    Arthritis    back   CAD (coronary artery disease)    a. s/p Coronary CT in 06/2018 showing mild stenosis along the mid-LAD, mid-LCx and mid-RCA   GERD (gastroesophageal reflux disease)    Hypertension    PONV (postoperative nausea and vomiting)     Past Surgical History:  Procedure Laterality Date   ABDOMINAL HYSTERECTOMY     partial hysterectomy, ovaries remain   BACK SURGERY     CHOLECYSTECTOMY N/A 11/27/2019   Procedure: LAPAROSCOPIC CHOLECYSTECTOMY;  Surgeon: Lucretia Roers, MD;  Location: [facility];  Service: General;  Laterality: N/A;   COLONOSCOPY N/A 02/29/2020   Procedure: COLONOSCOPY;  Surgeon: Malissa Hippo, MD;  Location: [facility];  Service: Endoscopy;  Laterality: N/A;  8:45   ESOPHAGEAL DILATION N/A 11/17/2017   Procedure: ESOPHAGEAL DILATION;  Surgeon: Malissa Hippo, MD;  Location: [facility];  Service: Endoscopy;  Laterality: N/A;   ESOPHAGOGASTRODUODENOSCOPY N/A 11/17/2017   Procedure: ESOPHAGOGASTRODUODENOSCOPY (EGD);  Surgeon: Malissa Hippo, MD;  [facility]: [facility];  Service: Endoscopy;  Laterality: N/A;  12:00   ESOPHAGOGASTRODUODENOSCOPY N/A 11/02/2019   Procedure: ESOPHAGOGASTRODUODENOSCOPY (EGD);  Surgeon: Malissa Hippo, MD;  [facility]: [facility];  Service: Endoscopy;  Laterality: N/A;  945   FOOT SURGERY     POLYPECTOMY  02/29/2020   Procedure: POLYPECTOMY;  Surgeon: Malissa Hippo, MD;  Location: [facility];  Service: Endoscopy;;    Current Outpatient Medications  Medication Sig Dispense Refill   aspirin EC 81 MG tablet Take 1 tablet (81 mg total) by mouth at bedtime.     Cholecalciferol (VITAMIN D-3) 125 MCG (5000 UT) TABS Take by mouth daily.     dexlansoprazole (DEXILANT) 60 MG capsule TAKE ONE CAPSULE BY MOUTH ONCE DAILY. 30 capsule 11   fluticasone (FLONASE) 50 MCG/ACT nasal spray Place 2 sprays into both nostrils daily.     guaiFENesin (MUCINEX PO) Take by mouth as needed. Takes as needed.     LORazepam (ATIVAN) 1 MG tablet Take 1 mg by mouth at bedtime.     metoCLOPramide (REGLAN) 5 MG tablet TAKE 1 TABLET BY MOUTH TWICE DAILY BEFORE MEALS. (Patient taking differently: as needed.) 180 tablet 0   Polyethyl Glycol-Propyl Glycol (SYSTANE OP) Apply to eye. One drop each eye twice daily     Polyethylene Glycol 3350 (MIRALAX PO) Take by mouth as needed.     sertraline (ZOLOFT) 25 MG tablet Take 25 mg by mouth daily.     rosuvastatin (CRESTOR) 40 MG tablet TAKE ONE TABLET BY MOUTH EVERY DAY (Patient not taking: Reported on 12/17/2022) 90 tablet 0   No current facility-administered medications for this visit.    Allergies as of 12/17/2022 -  Review Complete 12/17/2022  Allergen Reaction Noted   Codeine Other (See Comments) 05/27/2010   Demerol Rash 05/27/2010   Elemental sulfur Rash 05/27/2010   Feldene [piroxicam] Rash 05/27/2010   Latex  Rash 05/27/2010   Pyridium [phenazopyridine hcl] Itching and Rash 05/27/2010    Family History  Problem Relation Age of Onset   Stroke Mother    Stroke Father    Neuropathy Father    Cancer Maternal Uncle    Cancer Paternal Uncle    Cancer Paternal Uncle    Neuropathy Child     Social History   Socioeconomic History   Marital status: Widowed    Spouse name: Not on file   Number of children: Not on file   Years of education: Not on file   Highest education level: Not on file  Occupational History   Not on file  Tobacco Use   Smoking status: Never    Passive exposure: Past   Smokeless tobacco: Never  Vaping Use   Vaping status: Never Used  Substance and Sexual Activity   Alcohol use: No   Drug use: No   Sexual activity: Never  Other Topics Concern   Not on file  Social History Narrative   Not on file   Social Determinants of Health   Financial Resource Strain: Not on file  Food Insecurity: Not on file  Transportation Needs: Not on file  Physical Activity: Not on file  Stress: Not on file  Social Connections: Not on file    Review of systems General: negative for malaise, night sweats, fever, chills, weight loss Neck: Negative for lumps, goiter, pain and significant neck swelling Resp: Negative for cough, wheezing, dyspnea at rest CV: Negative for chest pain, leg swelling, palpitations, orthopnea GI: denies melena, hematochezia,vomiting, diarrhea, constipation, dysphagia, odyonophagia,  or unintentional weight loss. +nausea +early satiety  MSK: Negative for joint pain or swelling, back pain, and muscle pain. Derm: Negative for itching or rash Psych: Denies depression, anxiety, memory loss, confusion. No homicidal or suicidal ideation.  Heme: Negative for prolonged bleeding, bruising easily, and swollen nodes. Endocrine: Negative for cold or heat intolerance, polyuria, polydipsia and goiter. Neuro: negative for tremor, gait imbalance, syncope and  seizures. The remainder of the review of systems is noncontributory.  Physical Exam: BP 124/71 (BP Location: Left Arm, Patient Position: Sitting, Cuff Size: Normal)   Pulse 79   Temp 97.9 F (36.6 C) (Oral)   Ht 5\' 7"  (1.702 m)   Wt 151 lb 3.2 oz (68.6 kg)   BMI 23.68 kg/m  General:   Alert and oriented. No distress noted. Pleasant and cooperative.  Head:  Normocephalic and atraumatic. Eyes:  Conjuctiva clear without scleral icterus. Mouth:  Oral mucosa pink and moist. Good dentition. No lesions. Heart: Normal rate and rhythm, s1 and s2 heart sounds present.  Lungs: Clear lung sounds in all lobes. Respirations equal and unlabored. Abdomen:  +BS, soft, non-tender and non-distended. No rebound or guarding. No HSM or masses noted. Derm: No palmar erythema or jaundice Msk:  Symmetrical without gross deformities. Normal posture. Extremities:  Without edema. Neurologic:  Alert and  oriented x4 Psych:  Alert and cooperative. Normal mood and affect.  Invalid input(s): "6 MONTHS"   ASSESSMENT: NAYDEAN KINNIE is a 77 y.o. female presenting today for nausea and epigastric pain  Patient with history of gastroparesis though notably this has been managed with diet as she stopped her Reglan a while back however she notes some epigastric discomfort over the  past few months as well as some new onset of nausea over the past 3 to 4 weeks.  Notably she did have a course of doxycycline for respiratory infection. Low suspicion for pancreatitis as etiology given eating does not change her pain and abdominal exam is unremarkable, could be dealing with PUD or distal esophagitis especially in setting of recent doxy.  She notes some early satiety at times.  She is not taking any NSAIDs other than baby aspirin daily. Would be important to evaluate for other causes of her UGI symptoms via EGD as I cannot rule out gastritis, PUD, duodenitis, H pylori. Indications, risks and benefits of procedure discussed in  detail with patient. Patient verbalized understanding and is in agreement to proceed with EGD.   Notably, she reports low ferritin on recent labs with her PCP, she has history of the same about 10 years ago, saw hematology at that time but notes she was never told why her ferritin was low. She has not seen any blood in her stools, stools are darker now on PO iron. I will obtain labs for review, however, I discussed given new onset iron deficiency, it would be important to evaluation the entire GI tract with the addition of a colonoscopy as well, though given her UGI symptoms I did offer her the option of proceed with EGD first and planning for colonoscopy at a later date if EGD is unrevealing of a source which she opted for as she did not feel she would be able to tolerate drinking prep for the colonoscopy at this time.    PLAN:  EGD -ASA III  2. Obtain labs from PCP for review  3. Continue PO Iron 4. Will need colonoscopy at a later date if EGD unrevealing for source of her low ferritin 5. Zofran 4mg  Q8h PRN for nausea  All questions were answered, patient verbalized understanding and is in agreement with plan as outlined above.   Follow Up: 3 months   Tanush Drees L. Jeanmarie Hubert, MSN, APRN, AGNP-C Adult-Gerontology Nurse Practitioner [facility]

## 2022-12-17 NOTE — Progress Notes (Signed)
Referring Provider: Carylon Perches, MD Primary Care Physician:  Carylon Perches, MD Primary GI Physician: Previously Dr. Karilyn Cota  Chief Complaint  Patient presents with   GASTROPARESIS    Follow up on gastroparesis. Has been having nausea. Started 3 -4 weeks ago.    HPI:   Laurie Bridges is a 77 y.o. female with past medical history of anemia, anxiety, arthritis, CAD, GERD, HTN, gatroparesis    Patient presenting today for nausea and epigastric discomfort   Last seen February 2024, at that time GERD well-controlled on Dexilant 60 mg daily.  Taking Reglan once daily for gastroparesis, denied any vomiting, occasional bloating but for the most part doing smaller meals and having no issues.  Reported she can go up to 3 days without a bowel movement, taking MiraLAX as needed.  Patient recommended to continue Dexilant 60 mg daily, can trial off of Reglan for 1 to 2 weeks but should resume if having symptoms.  Continue smaller meals, continue MiraLAX as needed and continue good water intake.  Present:  Patient states nausea for the past 3-4 weeks, she has also had some abdominal discomfort as well but feels this began a few months ago. She did have a respiratory infection which she took a course of doxycycline for, nausea seemed to begin after this. She notes that her appetite is not great and she is not eating as much now. She had stopped her reglan as she was doing well but recently restarted this and does not feel this has helped any. She notes some upper abdominal pain that does not seem to be effected by eating. Denies heartburn or acid regurgitation. She is on a z pak now. Denies any recent steroids. Not taking any NSAIDs other than baby ASA. She notes some intermittent looser stools since being on antibiotics, reports she was going more on doxycycline but this has slowed down, she is having solid stools but sometimes looser stools thereafter.   She was started on PO iron by her PCP in September  as she reports her ferritin had dropped down to 23. Notes stools have been dark since starting iron, she notes that stools have been dark/green, she has not noticed any BRBPR.    GES: 12/02/20: Normal gastric emptying in the first 3 hours with slight decrease in the fourth hour. Last Colonoscopy:- 02/2020 One diminutive polyp in the ascending colon.                            Biopsied-TA                            - External hemorrhoids.                           - Anal papilla(e) were hypertrophied.   Last Endoscopy:2021  - Normal hypopharynx.                           - Normal esophagus.                           - Z-line irregular, 35 cm from the incisors.                           - 2 cm hiatal hernia.                           -  Normal stomach.                           - Normal duodenal bulb, second portion of the                            duodenum and major papilla.                           - No specimens collected.   Recommendations:  Repeat colonoscopy 7 years   Past Medical History:  Diagnosis Date   Anemia    hx of Fe def many years ago   Anxiety    Arthritis    back   CAD (coronary artery disease)    a. s/p Coronary CT in 06/2018 showing mild stenosis along the mid-LAD, mid-LCx and mid-RCA   GERD (gastroesophageal reflux disease)    Hypertension    PONV (postoperative nausea and vomiting)     Past Surgical History:  Procedure Laterality Date   ABDOMINAL HYSTERECTOMY     partial hysterectomy, ovaries remain   BACK SURGERY     CHOLECYSTECTOMY N/A 11/27/2019   Procedure: LAPAROSCOPIC CHOLECYSTECTOMY;  Surgeon: Lucretia Roers, MD;  Location: [facility];  Service: General;  Laterality: N/A;   COLONOSCOPY N/A 02/29/2020   Procedure: COLONOSCOPY;  Surgeon: Malissa Hippo, MD;  [facility]: [facility];  Service: Endoscopy;  Laterality: N/A;  8:45   ESOPHAGEAL DILATION N/A 11/17/2017   Procedure: ESOPHAGEAL DILATION;  Surgeon: Malissa Hippo, MD;  [facility];  Service: Endoscopy;  Laterality: N/A;   ESOPHAGOGASTRODUODENOSCOPY N/A 11/17/2017   Procedure: ESOPHAGOGASTRODUODENOSCOPY (EGD);  Surgeon: Malissa Hippo, MD;  [facility]: [facility];  Service: Endoscopy;  Laterality: N/A;  12:00   ESOPHAGOGASTRODUODENOSCOPY N/A 11/02/2019   Procedure: ESOPHAGOGASTRODUODENOSCOPY (EGD);  Surgeon: Malissa Hippo, MD;  [facility]: [facility];  Service: Endoscopy;  Laterality: N/A;  945   FOOT SURGERY     POLYPECTOMY  02/29/2020   Procedure: POLYPECTOMY;  Surgeon: Malissa Hippo, MD;  [facility]: [facility];  Service: Endoscopy;;    Current Outpatient Medications  Medication Sig Dispense Refill   aspirin EC 81 MG tablet Take 1 tablet (81 mg total) by mouth at bedtime.     Cholecalciferol (VITAMIN D-3) 125 MCG (5000 UT) TABS Take by mouth daily.     dexlansoprazole (DEXILANT) 60 MG capsule TAKE ONE CAPSULE BY MOUTH ONCE DAILY. 30 capsule 11   fluticasone (FLONASE) 50 MCG/ACT nasal spray Place 2 sprays into both nostrils daily.     guaiFENesin (MUCINEX PO) Take by mouth as needed. Takes as needed.     LORazepam (ATIVAN) 1 MG tablet Take 1 mg by mouth at bedtime.     metoCLOPramide (REGLAN) 5 MG tablet TAKE 1 TABLET BY MOUTH TWICE DAILY BEFORE MEALS. (Patient taking differently: as needed.) 180 tablet 0   Polyethyl Glycol-Propyl Glycol (SYSTANE OP) Apply to eye. One drop each eye twice daily     Polyethylene Glycol 3350 (MIRALAX PO) Take by mouth as needed.     sertraline (ZOLOFT) 25 MG tablet Take 25 mg by mouth daily.     rosuvastatin (CRESTOR) 40 MG tablet TAKE ONE TABLET BY MOUTH EVERY DAY (Patient not taking: Reported on 12/17/2022) 90 tablet 0   No current facility-administered medications for this visit.    Allergies as of 12/17/2022 -  Review Complete 12/17/2022  Allergen Reaction Noted   Codeine Other (See Comments) 05/27/2010   Demerol Rash 05/27/2010   Elemental sulfur Rash 05/27/2010   Feldene [piroxicam] Rash 05/27/2010   Latex  Rash 05/27/2010   Pyridium [phenazopyridine hcl] Itching and Rash 05/27/2010    Family History  Problem Relation Age of Onset   Stroke Mother    Stroke Father    Neuropathy Father    Cancer Maternal Uncle    Cancer Paternal Uncle    Cancer Paternal Uncle    Neuropathy Child     Social History   Socioeconomic History   Marital status: Widowed    Spouse name: Not on file   Number of children: Not on file   Years of education: Not on file   Highest education level: Not on file  Occupational History   Not on file  Tobacco Use   Smoking status: Never    Passive exposure: Past   Smokeless tobacco: Never  Vaping Use   Vaping status: Never Used  Substance and Sexual Activity   Alcohol use: No   Drug use: No   Sexual activity: Never  Other Topics Concern   Not on file  Social History Narrative   Not on file   Social Determinants of Health   Financial Resource Strain: Not on file  Food Insecurity: Not on file  Transportation Needs: Not on file  Physical Activity: Not on file  Stress: Not on file  Social Connections: Not on file    Review of systems General: negative for malaise, night sweats, fever, chills, weight loss Neck: Negative for lumps, goiter, pain and significant neck swelling Resp: Negative for cough, wheezing, dyspnea at rest CV: Negative for chest pain, leg swelling, palpitations, orthopnea GI: denies melena, hematochezia,vomiting, diarrhea, constipation, dysphagia, odyonophagia,  or unintentional weight loss. +nausea +early satiety  MSK: Negative for joint pain or swelling, back pain, and muscle pain. Derm: Negative for itching or rash Psych: Denies depression, anxiety, memory loss, confusion. No homicidal or suicidal ideation.  Heme: Negative for prolonged bleeding, bruising easily, and swollen nodes. Endocrine: Negative for cold or heat intolerance, polyuria, polydipsia and goiter. Neuro: negative for tremor, gait imbalance, syncope and  seizures. The remainder of the review of systems is noncontributory.  Physical Exam: BP 124/71 (BP Location: Left Arm, Patient Position: Sitting, Cuff Size: Normal)   Pulse 79   Temp 97.9 F (36.6 C) (Oral)   Ht 5\' 7"  (1.702 m)   Wt 151 lb 3.2 oz (68.6 kg)   BMI 23.68 kg/m  General:   Alert and oriented. No distress noted. Pleasant and cooperative.  Head:  Normocephalic and atraumatic. Eyes:  Conjuctiva clear without scleral icterus. Mouth:  Oral mucosa pink and moist. Good dentition. No lesions. Heart: Normal rate and rhythm, s1 and s2 heart sounds present.  Lungs: Clear lung sounds in all lobes. Respirations equal and unlabored. Abdomen:  +BS, soft, non-tender and non-distended. No rebound or guarding. No HSM or masses noted. Derm: No palmar erythema or jaundice Msk:  Symmetrical without gross deformities. Normal posture. Extremities:  Without edema. Neurologic:  Alert and  oriented x4 Psych:  Alert and cooperative. Normal mood and affect.  Invalid input(s): "6 MONTHS"   ASSESSMENT: NAYDEAN KINNIE is a 77 y.o. female presenting today for nausea and epigastric pain  Patient with history of gastroparesis though notably this has been managed with diet as she stopped her Reglan a while back however she notes some epigastric discomfort over the  past few months as well as some new onset of nausea over the past 3 to 4 weeks.  Notably she did have a course of doxycycline for respiratory infection. Low suspicion for pancreatitis as etiology given eating does not change her pain and abdominal exam is unremarkable, could be dealing with PUD or distal esophagitis especially in setting of recent doxy.  She notes some early satiety at times.  She is not taking any NSAIDs other than baby aspirin daily. Would be important to evaluate for other causes of her UGI symptoms via EGD as I cannot rule out gastritis, PUD, duodenitis, H pylori. Indications, risks and benefits of procedure discussed in  detail with patient. Patient verbalized understanding and is in agreement to proceed with EGD.   Notably, she reports low ferritin on recent labs with her PCP, she has history of the same about 10 years ago, saw hematology at that time but notes she was never told why her ferritin was low. She has not seen any blood in her stools, stools are darker now on PO iron. I will obtain labs for review, however, I discussed given new onset iron deficiency, it would be important to evaluation the entire GI tract with the addition of a colonoscopy as well, though given her UGI symptoms I did offer her the option of proceed with EGD first and planning for colonoscopy at a later date if EGD is unrevealing of a source which she opted for as she did not feel she would be able to tolerate drinking prep for the colonoscopy at this time.    PLAN:  EGD -ASA III  2. Obtain labs from PCP for review  3. Continue PO Iron 4. Will need colonoscopy at a later date if EGD unrevealing for source of her low ferritin 5. Zofran 4mg  Q8h PRN for nausea  All questions were answered, patient verbalized understanding and is in agreement with plan as outlined above.   Follow Up: 3 months   Tanush Drees L. Jeanmarie Hubert, MSN, APRN, AGNP-C Adult-Gerontology Nurse Practitioner [facility]

## 2022-12-18 ENCOUNTER — Encounter (INDEPENDENT_AMBULATORY_CARE_PROVIDER_SITE_OTHER): Payer: Self-pay

## 2022-12-21 ENCOUNTER — Other Ambulatory Visit (INDEPENDENT_AMBULATORY_CARE_PROVIDER_SITE_OTHER): Payer: Self-pay | Admitting: *Deleted

## 2022-12-21 ENCOUNTER — Encounter (INDEPENDENT_AMBULATORY_CARE_PROVIDER_SITE_OTHER): Payer: Self-pay

## 2022-12-21 DIAGNOSIS — R790 Abnormal level of blood mineral: Secondary | ICD-10-CM

## 2022-12-24 NOTE — Patient Instructions (Signed)
Laurie Bridges  12/24/2022     @PREFPERIOPPHARMACY @   Your procedure is scheduled on  12/29/2022.   Report to [facility] at  0915  A.M.   Call this number if you have problems the morning of surgery:  [phone]  If you experience any cold or flu symptoms such as cough, fever, chills, shortness of breath, etc. between now and your scheduled surgery, please notify us at the above number.   Remember:  Follow the diet and prep instructions given to you by the office.   You may drink clear liquids until 0715 am on 12/29/2022.    Clear liquids allowed are:                    Water, Juice (No red color; non-citric and without pulp; diabetics please choose diet or no sugar options), Carbonated beverages (diabetics please choose diet or no sugar options), Clear Tea (No creamer, milk, or cream, including half & half and powdered creamer), Black Coffee Only (No creamer, milk or cream, including half & half and powdered creamer), and Clear Sports drink (No red color; diabetics please choose diet or no sugar options)    Take these medicines the morning of surgery with A SIP OF WATER      dexlansoprazole, metoclopramide, zofran (if needed), zoloft.    Do not wear jewelry, make-up or nail polish, including gel polish,  artificial nails, or any other type of covering on natural nails (fingers and  toes).  Do not wear lotions, powders, or perfumes, or deodorant.  Do not shave 48 hours prior to surgery.  Men may shave face and neck.  Do not bring valuables to the hospital.  [facility] is not responsible for any belongings or valuables.  Contacts, dentures or bridgework may not be worn into surgery.  Leave your suitcase in the car.  After surgery it may be brought to your room.  For patients admitted to the hospital, discharge time will be determined by your treatment team.  Patients discharged the day of surgery will not be allowed to drive home and must have someone with  them for 24 hours.    Special instructions:   DO NOT smoke tobacco or vape for 24 hours before your procedure.  Please read over the following fact sheets that you were given. Anesthesia Post-op Instructions and Care and Recovery After Surgery      Upper Endoscopy, Adult, Care After After the procedure, it is common to have a sore throat. It is also common to have: Mild stomach pain or discomfort. Bloating. Nausea. Follow these instructions at home: The instructions below may help you care for yourself at home. Your health care provider may give you more instructions. If you have questions, ask your health care provider. If you were given a sedative during the procedure, it can affect you for several hours. Do not drive or operate machinery until your health care provider says that it is safe. If you will be going home right after the procedure, plan to have a responsible adult: Take you home from the hospital or clinic. You will not be allowed to drive. Care for you for the time you are told. Follow instructions from your health care provider about what you may eat and drink. Return to your normal activities as told by your health care provider. Ask your health care provider what activities are safe for you. Take over-the-counter and prescription medicines only as  told by your health care provider. Contact a health care provider if you: Have a sore throat that lasts longer than one day. Have trouble swallowing. Have a fever. Get help right away if you: Vomit blood or your vomit looks like coffee grounds. Have bloody, black, or tarry stools. Have a very bad sore throat or you cannot swallow. Have difficulty breathing or very bad pain in your chest or abdomen. These symptoms may be an emergency. Get help right away. Call 911. Do not wait to see if the symptoms will go away. Do not drive yourself to the hospital. Summary After the procedure, it is common to have a sore throat, mild  stomach discomfort, bloating, and nausea. If you were given a sedative during the procedure, it can affect you for several hours. Do not drive until your health care provider says that it is safe. Follow instructions from your health care provider about what you may eat and drink. Return to your normal activities as told by your health care provider. This information is not intended to replace advice given to you by your health care provider. Make sure you discuss any questions you have with your health care provider. Document Revised: 04/09/2021 Document Reviewed: 04/09/2021 Elsevier Patient Education  2024 [employer]. Monitored Anesthesia Care, Care After The following information offers guidance on how to care for yourself after your procedure. Your health care provider may also give you more specific instructions. If you have problems or questions, contact your health care provider. What can I expect after the procedure? After the procedure, it is common to have: Tiredness. Little or no memory about what happened during or after the procedure. Impaired judgment when it comes to making decisions. Nausea or vomiting. Some trouble with balance. Follow these instructions at home: For the time period you were told by your health care provider:  Rest. Do not participate in activities where you could fall or become injured. Do not drive or use machinery. Do not drink alcohol. Do not take sleeping pills or medicines that cause drowsiness. Do not make important decisions or sign legal documents. Do not take care of children on your own. Medicines Take over-the-counter and prescription medicines only as told by your health care provider. If you were prescribed antibiotics, take them as told by your health care provider. Do not stop using the antibiotic even if you start to feel better. Eating and drinking Follow instructions from your health care provider about what you may eat and  drink. Drink enough fluid to keep your urine pale yellow. If you vomit: Drink clear fluids slowly and in small amounts as you are able. Clear fluids include water, ice chips, low-calorie sports drinks, and fruit juice that has water added to it (diluted fruit juice). Eat light and bland foods in small amounts as you are able. These foods include bananas, applesauce, rice, lean meats, toast, and crackers. General instructions  Have a responsible adult stay with you for the time you are told. It is important to have someone help care for you until you are awake and alert. If you have sleep apnea, surgery and some medicines can increase your risk for breathing problems. Follow instructions from your health care provider about wearing your sleep device: When you are sleeping. This includes during daytime naps. While taking prescription pain medicines, sleeping medicines, or medicines that make you drowsy. Do not use any products that contain nicotine or tobacco. These products include cigarettes, chewing tobacco, and vaping devices, such  as e-cigarettes. If you need help quitting, ask your health care provider. Contact a health care provider if: You feel nauseous or vomit every time you eat or drink. You feel light-headed. You are still sleepy or having trouble with balance after 24 hours. You get a rash. You have a fever. You have redness or swelling around the IV site. Get help right away if: You have trouble breathing. You have new confusion after you get home. These symptoms may be an emergency. Get help right away. Call 911. Do not wait to see if the symptoms will go away. Do not drive yourself to the hospital. This information is not intended to replace advice given to you by your health care provider. Make sure you discuss any questions you have with your health care provider. Document Revised: 05/26/2021 Document Reviewed: 05/26/2021 Elsevier Patient Education  2024 [employer].

## 2022-12-25 ENCOUNTER — Other Ambulatory Visit (HOSPITAL_COMMUNITY)
Admission: RE | Admit: 2022-12-25 | Discharge: 2022-12-25 | Disposition: A | Discharge status: Home / Self Care | Payer: Medicare PPO | Source: Ambulatory Visit | Attending: Gastroenterology | Admitting: Gastroenterology

## 2022-12-25 ENCOUNTER — Encounter (HOSPITAL_COMMUNITY)
Admission: RE | Admit: 2022-12-25 | Discharge: 2022-12-25 | Disposition: A | Discharge status: Home / Self Care | Payer: Medicare PPO | Source: Ambulatory Visit | Attending: Gastroenterology | Admitting: Gastroenterology

## 2022-12-25 ENCOUNTER — Encounter (HOSPITAL_COMMUNITY): Payer: Self-pay

## 2022-12-25 VITALS — BP 124/71 | HR 79 | Temp 97.9°F | Resp 18 | Ht 67.0 in | Wt 151.2 lb

## 2022-12-25 DIAGNOSIS — Z01818 Encounter for other preprocedural examination: Secondary | ICD-10-CM | POA: Insufficient documentation

## 2022-12-25 DIAGNOSIS — I10 Essential (primary) hypertension: Secondary | ICD-10-CM | POA: Insufficient documentation

## 2022-12-25 DIAGNOSIS — E0800 Diabetes mellitus due to underlying condition with hyperosmolarity without nonketotic hyperglycemic-hyperosmolar coma (NKHHC): Secondary | ICD-10-CM | POA: Insufficient documentation

## 2022-12-25 DIAGNOSIS — D649 Anemia, unspecified: Secondary | ICD-10-CM | POA: Diagnosis not present

## 2022-12-25 HISTORY — DX: Prediabetes: R73.03

## 2022-12-25 LAB — CBC WITH DIFFERENTIAL/PLATELET
Abs Immature Granulocytes: 0.01 10*3/uL (ref 0.00–0.07)
Basophils Absolute: 0 10*3/uL (ref 0.0–0.1)
Basophils Relative: 1 %
Eosinophils Absolute: 0.1 10*3/uL (ref 0.0–0.5)
Eosinophils Relative: 3 %
HCT: 41.7 % (ref 36.0–46.0)
Hemoglobin: 13.7 g/dL (ref 12.0–15.0)
Immature Granulocytes: 0 %
Lymphocytes Relative: 25 %
Lymphs Abs: 1.3 10*3/uL (ref 0.7–4.0)
MCH: 29.1 pg (ref 26.0–34.0)
MCHC: 32.9 g/dL (ref 30.0–36.0)
MCV: 88.7 fL (ref 80.0–100.0)
Monocytes Absolute: 0.5 10*3/uL (ref 0.1–1.0)
Monocytes Relative: 10 %
Neutro Abs: 3.3 10*3/uL (ref 1.7–7.7)
Neutrophils Relative %: 61 %
Platelets: 290 10*3/uL (ref 150–400)
RBC: 4.7 MIL/uL (ref 3.87–5.11)
RDW: 12.9 % (ref 11.5–15.5)
WBC: 5.4 10*3/uL (ref 4.0–10.5)
nRBC: 0 % (ref 0.0–0.2)

## 2022-12-25 LAB — BASIC METABOLIC PANEL
Anion gap: 10 (ref 5–15)
BUN: 13 mg/dL (ref 8–23)
CO2: 22 mmol/L (ref 22–32)
Calcium: 9.8 mg/dL (ref 8.9–10.3)
Chloride: 105 mmol/L (ref 98–111)
Creatinine, Ser: 0.9 mg/dL (ref 0.44–1.00)
GFR, Estimated: >60 mL/min (ref >60)
Glucose, Bld: 94 mg/dL (ref 70–99)
Potassium: 4 mmol/L (ref 3.5–5.1)
Sodium: 137 mmol/L (ref 135–145)

## 2022-12-25 LAB — IRON AND TIBC
Iron: 66 ug/dL (ref 28–170)
Saturation Ratios: 23 % (ref 10.4–31.8)
TIBC: 286 ug/dL (ref 250–450)
UIBC: 220 ug/dL

## 2022-12-29 ENCOUNTER — Ambulatory Visit (HOSPITAL_COMMUNITY): Payer: Medicare PPO | Admitting: Anesthesiology

## 2022-12-29 ENCOUNTER — Encounter (HOSPITAL_COMMUNITY)
Admission: RE | Disposition: A | Discharge status: Home / Self Care | Payer: Self-pay | Source: Ambulatory Visit | Attending: Gastroenterology

## 2022-12-29 ENCOUNTER — Encounter (HOSPITAL_COMMUNITY): Payer: Self-pay

## 2022-12-29 ENCOUNTER — Ambulatory Visit (HOSPITAL_COMMUNITY)
Admission: RE | Admit: 2022-12-29 | Discharge: 2022-12-29 | Disposition: A | Discharge status: Home / Self Care | Payer: Medicare PPO | Source: Ambulatory Visit | Attending: Gastroenterology | Admitting: Gastroenterology

## 2022-12-29 DIAGNOSIS — K449 Diaphragmatic hernia without obstruction or gangrene: Secondary | ICD-10-CM | POA: Diagnosis not present

## 2022-12-29 DIAGNOSIS — F419 Anxiety disorder, unspecified: Secondary | ICD-10-CM | POA: Diagnosis not present

## 2022-12-29 DIAGNOSIS — I10 Essential (primary) hypertension: Secondary | ICD-10-CM | POA: Diagnosis not present

## 2022-12-29 DIAGNOSIS — R1013 Epigastric pain: Secondary | ICD-10-CM | POA: Insufficient documentation

## 2022-12-29 DIAGNOSIS — K3189 Other diseases of stomach and duodenum: Secondary | ICD-10-CM | POA: Diagnosis not present

## 2022-12-29 DIAGNOSIS — D649 Anemia, unspecified: Secondary | ICD-10-CM | POA: Insufficient documentation

## 2022-12-29 DIAGNOSIS — R110 Nausea: Secondary | ICD-10-CM | POA: Insufficient documentation

## 2022-12-29 DIAGNOSIS — Z860100 Personal history of colon polyps, unspecified: Secondary | ICD-10-CM | POA: Diagnosis not present

## 2022-12-29 DIAGNOSIS — K3184 Gastroparesis: Secondary | ICD-10-CM | POA: Diagnosis not present

## 2022-12-29 DIAGNOSIS — Z7982 Long term (current) use of aspirin: Secondary | ICD-10-CM | POA: Diagnosis not present

## 2022-12-29 DIAGNOSIS — I2510 Atherosclerotic heart disease of native coronary artery without angina pectoris: Secondary | ICD-10-CM | POA: Diagnosis not present

## 2022-12-29 DIAGNOSIS — K2970 Gastritis, unspecified, without bleeding: Secondary | ICD-10-CM | POA: Diagnosis not present

## 2022-12-29 DIAGNOSIS — Z79899 Other long term (current) drug therapy: Secondary | ICD-10-CM | POA: Diagnosis not present

## 2022-12-29 DIAGNOSIS — R0602 Shortness of breath: Secondary | ICD-10-CM | POA: Insufficient documentation

## 2022-12-29 DIAGNOSIS — K219 Gastro-esophageal reflux disease without esophagitis: Secondary | ICD-10-CM | POA: Insufficient documentation

## 2022-12-29 DIAGNOSIS — K2950 Unspecified chronic gastritis without bleeding: Secondary | ICD-10-CM | POA: Insufficient documentation

## 2022-12-29 HISTORY — PX: BIOPSY: SHX5522

## 2022-12-29 HISTORY — PX: ESOPHAGOGASTRODUODENOSCOPY (EGD) WITH PROPOFOL: SHX5813

## 2022-12-29 SURGERY — ESOPHAGOGASTRODUODENOSCOPY (EGD) WITH PROPOFOL
Anesthesia: General

## 2022-12-29 MED ORDER — LACTATED RINGERS IV SOLN: INTRAVENOUS | Status: DC | PRN

## 2022-12-29 MED ORDER — LIDOCAINE HCL (PF) 2 % IJ SOLN
INTRAMUSCULAR | Status: DC | PRN
  Administered 2022-12-29: 100 mg via INTRADERMAL

## 2022-12-29 MED ORDER — LACTATED RINGERS IV SOLN: INTRAVENOUS | Status: DC

## 2022-12-29 MED ORDER — PROPOFOL 10 MG/ML IV BOLUS
INTRAVENOUS | Status: DC | PRN
  Administered 2022-12-29: 100 mg via INTRAVENOUS

## 2022-12-29 MED ORDER — PROPOFOL 500 MG/50ML IV EMUL
INTRAVENOUS | Status: DC | PRN
  Administered 2022-12-29: 150 ug/kg/min via INTRAVENOUS

## 2022-12-29 NOTE — Anesthesia Procedure Notes (Signed)
Date/Time: 12/29/2022 10:50 AM  Performed by: Julian Reil, CRNAPre-anesthesia Checklist: Patient identified, Emergency Drugs available, Suction available and Patient being monitored Patient Re-evaluated:Patient Re-evaluated prior to induction Oxygen Delivery Method: Nasal cannula Induction Type: IV induction Placement Confirmation: positive ETCO2

## 2022-12-29 NOTE — Op Note (Signed)
[facility] Patient Name: Laurie Bridges Procedure Date: 12/29/2022 10:42 AM MRN: [id number] Date of Birth: October 18, 1945 Attending MD: Sanjuan Dame , MD, [id number] CSN: [id number] Age: 77 Admit Type: Outpatient Procedure:                Upper GI endoscopy Indications:              Epigastric abdominal pain, Nausea Providers:                Sanjuan Dame, MD, Sheran Fava, Elinor Parkinson Referring MD:              Medicines:                Monitored Anesthesia Care Complications:            No immediate complications. Estimated Blood Loss:     Estimated blood loss was minimal. Procedure:                Pre-Anesthesia Assessment:                           - Prior to the procedure, a History and Physical                            was performed, and patient medications and                            allergies were reviewed. The patient's tolerance of                            previous anesthesia was also reviewed. The risks                            and benefits of the procedure and the sedation                            options and risks were discussed with the patient.                            All questions were answered, and informed consent                            was obtained. Prior Anticoagulants: The patient has                            taken no anticoagulant or antiplatelet agents. ASA                            Grade Assessment: III - A patient with severe                            systemic disease. After reviewing the risks and  benefits, the patient was deemed in satisfactory                            condition to undergo the procedure.                           After obtaining informed consent, the endoscope was                            passed under direct vision. Throughout the                            procedure, the patient's blood pressure, pulse, and                            oxygen  saturations were monitored continuously. The                            [id number]-H190 ([id number]) scope was introduced through the                            mouth, and advanced to the second part of duodenum.                            The upper GI endoscopy was accomplished without                            difficulty. The patient tolerated the procedure                            well. Scope In: 10:56:32 AM Scope Out: 11:01:31 AM Total Procedure Duration: 0 hours 4 minutes 59 seconds  Findings:      The examined esophagus was normal.      A 2 cm hiatal hernia was present.      Mildly erythematous mucosa without bleeding was found in the gastric       antrum. Biopsies were taken with a cold forceps for histology.      The duodenal bulb and second portion of the duodenum were normal. Impression:               - Normal esophagus.                           - 2 cm hiatal hernia.                           - Erythematous mucosa in the antrum. Biopsied.                           - Normal duodenal bulb and second portion of the                            duodenum. Moderate Sedation:      Per Anesthesia Care Recommendation:           - Patient has a contact number available for  emergencies. The signs and symptoms of potential                            delayed complications were discussed with the                            patient. Return to normal activities tomorrow.                            Written discharge instructions were provided to the                            patient.                           - Resume previous diet.                           - Continue present medications.                           - Await pathology results.                           -Follow up in GI clinic Procedure Code(s):        --- Professional ---                           [phone], Esophagogastroduodenoscopy, flexible,                            transoral; with biopsy, single or  multiple Diagnosis Code(s):        --- Professional ---                           K44.9, Diaphragmatic hernia without obstruction or                            gangrene                           K31.89, Other diseases of stomach and duodenum                           R10.13, Epigastric pain                           R11.0, Nausea CPT copyright 2022 American Medical Association. All rights reserved. The codes documented in this report are preliminary and upon coder review may  be revised to meet current compliance requirements. Sanjuan Dame, MD Sanjuan Dame, MD 12/29/2022 11:08:15 AM This report has been signed electronically. Number of Addenda: 0

## 2022-12-29 NOTE — Transfer of Care (Signed)
Immediate Anesthesia Transfer of Care Note  Patient: Laurie Bridges  Procedure(s) Performed: ESOPHAGOGASTRODUODENOSCOPY (EGD) WITH PROPOFOL BIOPSY  Patient Location: Short Stay  Anesthesia Type:General  Level of Consciousness: awake, alert , and oriented  Airway & Oxygen Therapy: Patient Spontanous Breathing  Post-op Assessment: Report given to RN and Post -op Vital signs reviewed and stable  Post vital signs: Reviewed and stable  Last Vitals:  Vitals Value Taken Time  BP    Temp    Pulse    Resp    SpO2      Last Pain:  Vitals:   12/29/22 1052  PainSc: 0-No pain         Complications: No notable events documented.

## 2022-12-29 NOTE — Discharge Instructions (Signed)

## 2022-12-29 NOTE — Interval H&P Note (Signed)
History and Physical Interval Note:  12/29/2022 10:05 AM  Laurie Bridges  has presented today for surgery, with the diagnosis of NAUSEA,EPIGASTRIC PAIN.  The various methods of treatment have been discussed with the patient and family. After consideration of risks, benefits and other options for treatment, the patient has consented to  Procedure(s) with comments: ESOPHAGOGASTRODUODENOSCOPY (EGD) WITH PROPOFOL (N/A) - 11:15AM;ASA 3 as a surgical intervention.  The patient's history has been reviewed, patient examined, no change in status, stable for surgery.  I have reviewed the patient's chart and labs.  Questions were answered to the patient's satisfaction.     Juanetta Beets [name]

## 2022-12-29 NOTE — Anesthesia Preprocedure Evaluation (Signed)
Anesthesia Evaluation  Patient identified by MRN, date of birth, ID band Patient awake    Reviewed: Allergy & Precautions, H&P , NPO status , Patient's Chart, lab work & pertinent test results, reviewed documented beta blocker date and time   History of Anesthesia Complications (+) PONV and history of anesthetic complications  Airway Mallampati: II  TM Distance: >3 FB Neck ROM: full    Dental no notable dental hx.    Pulmonary neg pulmonary ROS, shortness of breath   Pulmonary exam normal breath sounds clear to auscultation       Cardiovascular Exercise Tolerance: Good hypertension, + CAD  negative cardio ROS  Rhythm:regular Rate:Normal     Neuro/Psych   Anxiety     negative neurological ROS  negative psych ROS   GI/Hepatic negative GI ROS, Neg liver ROS,GERD  ,,  Endo/Other  negative endocrine ROS    Renal/GU negative Renal ROS  negative genitourinary   Musculoskeletal   Abdominal   Peds  Hematology negative hematology ROS (+) Blood dyscrasia, anemia   Anesthesia Other Findings   Reproductive/Obstetrics negative OB ROS                             Anesthesia Physical Anesthesia Plan  ASA: 2  Anesthesia Plan: General   Post-op Pain Management:    Induction:   PONV Risk Score and Plan: Propofol infusion  Airway Management Planned:   Additional Equipment:   Intra-op Plan:   Post-operative Plan:   Informed Consent: I have reviewed the patients History and Physical, chart, labs and discussed the procedure including the risks, benefits and alternatives for the proposed anesthesia with the patient or authorized representative who has indicated his/her understanding and acceptance.     Dental Advisory Given  Plan Discussed with: CRNA  Anesthesia Plan Comments:        Anesthesia Quick Evaluation

## 2022-12-30 ENCOUNTER — Encounter (INDEPENDENT_AMBULATORY_CARE_PROVIDER_SITE_OTHER): Payer: Self-pay

## 2022-12-31 LAB — SURGICAL PATHOLOGY

## 2023-01-01 ENCOUNTER — Encounter (INDEPENDENT_AMBULATORY_CARE_PROVIDER_SITE_OTHER): Payer: Self-pay | Admitting: Gastroenterology

## 2023-01-01 NOTE — Anesthesia Postprocedure Evaluation (Signed)
Anesthesia Post Note  Patient: Laurie Bridges  Procedure(s) Performed: ESOPHAGOGASTRODUODENOSCOPY (EGD) WITH PROPOFOL BIOPSY  Patient location during evaluation: Phase II Anesthesia Type: General Level of consciousness: awake Pain management: pain level controlled Vital Signs Assessment: post-procedure vital signs reviewed and stable Respiratory status: spontaneous breathing and respiratory function stable Cardiovascular status: blood pressure returned to baseline and stable Postop Assessment: no headache and no apparent nausea or vomiting Anesthetic complications: no Comments: Late entry   No notable events documented.   Last Vitals:  Vitals:   12/29/22 0953 12/29/22 1105  BP: (!) 141/79 115/62  Pulse: 67 65  Resp: 20 18  Temp: 36.5 C (!) 36.4 C  SpO2: 98% 98%    Last Pain:  Vitals:   12/29/22 1105  TempSrc: Oral  PainSc: 0-No pain                 Windell Norfolk

## 2023-01-04 ENCOUNTER — Encounter (HOSPITAL_COMMUNITY): Payer: Self-pay | Admitting: Gastroenterology

## 2023-01-04 ENCOUNTER — Encounter (INDEPENDENT_AMBULATORY_CARE_PROVIDER_SITE_OTHER): Payer: Self-pay | Admitting: *Deleted

## 2023-01-04 NOTE — Progress Notes (Signed)
I reviewed the pathology results. Ann, can you send her a letter with the findings as described below please?  Thanks,  Laurie Lawman, MD Gastroenterology and Hepatology [facility]  ---------------------------------------------------------------------------------------------  [facility] [address], [place], [place] [address] Phone:  [phone]   01/04/23 [place], [place]   Dear Laurie Bridges,  I am writing to inform you that the biopsies taken during your recent endoscopic examination showed:  No H. Pylori bacteria in stomach , or any early cancer changes to the stomach mucosa ( Intestinal metaplasia) . This is good news   Also I value your feedback , so if you get a survey , please take the time to fill it out and thank you for choosing [facility]  Please call us at [phone] if you have persistent problems or have questions about your condition that have not been fully answered at this time.  Sincerely,  Laurie Lawman, MD Gastroenterology and Hepatology

## 2023-01-11 ENCOUNTER — Encounter (HOSPITAL_COMMUNITY)
Admission: RE | Admit: 2023-01-11 | Discharge: 2023-01-11 | Disposition: A | Discharge status: Home / Self Care | Payer: Medicare PPO | Source: Ambulatory Visit | Attending: Ophthalmology | Admitting: Ophthalmology

## 2023-01-11 ENCOUNTER — Encounter (HOSPITAL_COMMUNITY): Payer: Self-pay

## 2023-01-11 DIAGNOSIS — H25811 Combined forms of age-related cataract, right eye: Secondary | ICD-10-CM | POA: Diagnosis not present

## 2023-01-11 NOTE — Pre-Procedure Instructions (Signed)
Attempted per-op phone call. Left VM for her to call us back.

## 2023-01-14 NOTE — H&P (Signed)
 Surgical History & Physical  Patient Name: Laurie Bridges  DOB: October 27, 1945  Surgery: Cataract extraction with intraocular lens implant phacoemulsification; Right Eye Surgeon: Lynwood Hermann MD Surgery Date: 01/15/2023 Pre-Op Date: 11/26/2022  HPI: A 89 Yr. old female patient 1. The patient was referred to us  from Dr. Darroll for a cataract evaluation. The patient's vision is blurry. The condition's severity is constant. The complaint is associated with difficulty reading small print on medicine bottles/labels, difficulty with glare on bright sunny days, and difficulty seeing captions on tv. This is negatively affecting the patient's quality of life and the patient is unable to function adequately in life with the current level of vision. HPI Completed by Dr. Lynwood Hermann  Medical History: Dry Eyes Cataracts  Acid reflux  LDL  Review of Systems Allergic/Immunologic Seasonal Allergies All recorded systems are negative except as noted above  Social Never smoked   Medication Systane, Restasis,  sertraline ,  dexlansoprazole  ,  ipratropium bromide ,  rosuvastatin  ,  Fluad Triv [id number](34b up)(PF) ,  lorazepam   Sx/Procedures Hip Replacement, No implantable devices or other major sx  Drug Allergies  latex ,  pyridium ,  feldene ,  elemental sulfur  ,  Demerol  ,  sulfa ,  codeine   History & Physical: Heent: cataracts NECK: supple without bruits LUNGS: lungs clear to auscultation CV: regular rate and rhythm Abdomen: soft and non-tender  Impression & Plan: Assessment: 1.  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  BLEPHARITIS; Right Upper Lid, Right Lower Lid, Left Upper Lid, Left Lower Lid (H01.001, H01.002,H01.004,H01.005) 3.  Dry Eye Syndrome; Both Eyes (H04.123) 4.  ASTIGMATISM, REGULAR; Both Eyes (H52.223)  Plan: 1.  Cataract accounts for the patient's decreased vision. This visual impairment is not correctable with a tolerable change in glasses or  contact lenses. Cataract surgery with an implantation of a new lens should significantly improve the visual and functional status of the patient. Discussed all risks, benefits, alternatives, and potential complications. Discussed the procedures and recovery. Patient desires to have surgery. A-scan ordered and performed today for intra-ocular lens calculations. The surgery will be performed in order to improve vision for driving, reading, and for eye examinations. Recommend phacoemulsification with intra-ocular lens. Recommend Dextenza  for post-operative pain and inflammation. Right Eye worse first. Dilates well - shugarcaine by protocol. Toric Lens.  2.  Blepharitis is present - recommend regular lid cleaning.  3.  Continue Restasis 1 drop every 12 hours both eyes. Preservative Free Artificial tears 1 drop 2-3x/day.  4.  recommend toric IOL OU .

## 2023-01-15 ENCOUNTER — Ambulatory Visit (HOSPITAL_COMMUNITY): Payer: Medicare PPO | Admitting: Certified Registered"

## 2023-01-15 ENCOUNTER — Encounter (HOSPITAL_COMMUNITY)
Admission: RE | Disposition: A | Discharge status: Home / Self Care | Payer: Self-pay | Source: Ambulatory Visit | Attending: Ophthalmology

## 2023-01-15 ENCOUNTER — Ambulatory Visit (HOSPITAL_COMMUNITY)
Admission: RE | Admit: 2023-01-15 | Discharge: 2023-01-15 | Disposition: A | Discharge status: Home / Self Care | Payer: Medicare PPO | Source: Ambulatory Visit | Attending: Ophthalmology | Admitting: Ophthalmology

## 2023-01-15 ENCOUNTER — Encounter (HOSPITAL_COMMUNITY): Payer: Self-pay | Admitting: Ophthalmology

## 2023-01-15 DIAGNOSIS — K219 Gastro-esophageal reflux disease without esophagitis: Secondary | ICD-10-CM | POA: Diagnosis not present

## 2023-01-15 DIAGNOSIS — H0100A Unspecified blepharitis right eye, upper and lower eyelids: Secondary | ICD-10-CM | POA: Diagnosis not present

## 2023-01-15 DIAGNOSIS — F419 Anxiety disorder, unspecified: Secondary | ICD-10-CM | POA: Diagnosis not present

## 2023-01-15 DIAGNOSIS — I2510 Atherosclerotic heart disease of native coronary artery without angina pectoris: Secondary | ICD-10-CM | POA: Insufficient documentation

## 2023-01-15 DIAGNOSIS — H0100B Unspecified blepharitis left eye, upper and lower eyelids: Secondary | ICD-10-CM | POA: Insufficient documentation

## 2023-01-15 DIAGNOSIS — H25811 Combined forms of age-related cataract, right eye: Secondary | ICD-10-CM | POA: Diagnosis not present

## 2023-01-15 DIAGNOSIS — H25813 Combined forms of age-related cataract, bilateral: Secondary | ICD-10-CM | POA: Diagnosis not present

## 2023-01-15 DIAGNOSIS — H04123 Dry eye syndrome of bilateral lacrimal glands: Secondary | ICD-10-CM | POA: Diagnosis not present

## 2023-01-15 DIAGNOSIS — H52223 Regular astigmatism, bilateral: Secondary | ICD-10-CM | POA: Diagnosis not present

## 2023-01-15 DIAGNOSIS — I10 Essential (primary) hypertension: Secondary | ICD-10-CM | POA: Diagnosis not present

## 2023-01-15 HISTORY — PX: CATARACT EXTRACTION W/PHACO: SHX586

## 2023-01-15 SURGERY — PHACOEMULSIFICATION, CATARACT, WITH IOL INSERTION
Anesthesia: Monitor Anesthesia Care | Site: Eye | Laterality: Right

## 2023-01-15 MED ORDER — MIDAZOLAM HCL 2 MG/2ML IJ SOLN
INTRAMUSCULAR | Status: AC
  Filled 2023-01-15: qty 2

## 2023-01-15 MED ORDER — MOXIFLOXACIN HCL 5 MG/ML IO SOLN
INTRAOCULAR | Status: DC | PRN
  Administered 2023-01-15: .3 mL via INTRACAMERAL

## 2023-01-15 MED ORDER — LIDOCAINE HCL (PF) 1 % IJ SOLN
INTRAOCULAR | Status: DC | PRN
  Administered 2023-01-15: 1 mL via OPHTHALMIC

## 2023-01-15 MED ORDER — POVIDONE-IODINE 5 % OP SOLN
OPHTHALMIC | Status: DC | PRN
  Administered 2023-01-15: 1 via OPHTHALMIC

## 2023-01-15 MED ORDER — EPINEPHRINE PF 1 MG/ML IJ SOLN
INTRAOCULAR | Status: DC | PRN
  Administered 2023-01-15: 500 mL

## 2023-01-15 MED ORDER — SODIUM HYALURONATE 10 MG/ML IO SOLUTION
PREFILLED_SYRINGE | INTRAOCULAR | Status: DC | PRN
  Administered 2023-01-15: .85 mL via INTRAOCULAR

## 2023-01-15 MED ORDER — STERILE WATER FOR IRRIGATION IR SOLN
Status: DC | PRN
  Administered 2023-01-15: 1

## 2023-01-15 MED ORDER — LIDOCAINE HCL 3.5 % OP GEL
1.0000 | Freq: Once | OPHTHALMIC | Status: AC
  Administered 2023-01-15: 1 via OPHTHALMIC

## 2023-01-15 MED ORDER — SODIUM HYALURONATE 23MG/ML IO SOSY
PREFILLED_SYRINGE | INTRAOCULAR | Status: DC | PRN
  Administered 2023-01-15: .6 mL via INTRAOCULAR

## 2023-01-15 MED ORDER — BSS IO SOLN
INTRAOCULAR | Status: DC | PRN
  Administered 2023-01-15: 15 mL via INTRAOCULAR

## 2023-01-15 MED ORDER — SODIUM CHLORIDE 0.9% FLUSH: 3.0000 mL | INTRAVENOUS | Status: DC | PRN

## 2023-01-15 MED ORDER — SODIUM CHLORIDE 0.9% FLUSH
3.0000 mL | Freq: Two times a day (BID) | INTRAVENOUS | Status: DC
  Administered 2023-01-15: 6 mL via INTRAVENOUS

## 2023-01-15 MED ORDER — MIDAZOLAM HCL 2 MG/2ML IJ SOLN
INTRAMUSCULAR | Status: DC | PRN
  Administered 2023-01-15: 1 mg via INTRAVENOUS

## 2023-01-15 MED ORDER — PHENYLEPHRINE HCL 2.5 % OP SOLN
1.0000 [drp] | OPHTHALMIC | Status: AC | PRN
  Administered 2023-01-15 (×3): 1 [drp] via OPHTHALMIC

## 2023-01-15 MED ORDER — TROPICAMIDE 1 % OP SOLN
1.0000 [drp] | OPHTHALMIC | Status: AC | PRN
  Administered 2023-01-15 (×3): 1 [drp] via OPHTHALMIC

## 2023-01-15 MED ORDER — TETRACAINE HCL 0.5 % OP SOLN
1.0000 [drp] | OPHTHALMIC | Status: AC | PRN
  Administered 2023-01-15 (×3): 1 [drp] via OPHTHALMIC

## 2023-01-15 SURGICAL SUPPLY — 12 items
CATARACT SUITE SIGHTPATH (MISCELLANEOUS) ×1
CLOTH BEACON ORANGE TIMEOUT ST (SAFETY) ×1 IMPLANT
EYE SHIELD UNIVERSAL CLEAR (GAUZE/BANDAGES/DRESSINGS) IMPLANT
FEE CATARACT SUITE SIGHTPATH (MISCELLANEOUS) ×1 IMPLANT
GLOVE BIOGEL PI IND STRL 7.0 (GLOVE) ×2 IMPLANT
LENS IOL TECNIS EYHANCE 16.0 (Intraocular Lens) IMPLANT
NDL HYPO 18GX1.5 BLUNT FILL (NEEDLE) ×1 IMPLANT
NEEDLE HYPO 18GX1.5 BLUNT FILL (NEEDLE) ×1
PAD ARMBOARD 7.5X6 YLW CONV (MISCELLANEOUS) ×1 IMPLANT
SYR TB 1ML LL NO SAFETY (SYRINGE) ×1 IMPLANT
TAPE PAPER 2X10 WHT MICROPORE (GAUZE/BANDAGES/DRESSINGS) IMPLANT
WATER STERILE IRR 250ML POUR (IV SOLUTION) ×1 IMPLANT

## 2023-01-15 NOTE — Anesthesia Procedure Notes (Signed)
 Procedure Name: MAC Date/Time: 01/15/2023 10:14 AM  Performed by: Eliodoro Deward FALCON, CRNAPre-anesthesia Checklist: Patient identified, Emergency Drugs available, Suction available and Patient being monitored Patient Re-evaluated:Patient Re-evaluated prior to induction Oxygen Delivery Method: Nasal cannula Placement Confirmation: positive ETCO2

## 2023-01-15 NOTE — Discharge Instructions (Addendum)
 Please discharge patient when stable, will follow up today with Dr. June Leap at the [facility] office immediately following discharge.  Leave shield in place until visit.  All paperwork with discharge instructions will be given at the office.  [facility] Address:  [address]  [place], [place] [address]

## 2023-01-15 NOTE — Transfer of Care (Signed)
 Immediate Anesthesia Transfer of Care Note  Patient: Laurie Bridges  Procedure(s) Performed: CATARACT EXTRACTION PHACO AND INTRAOCULAR LENS PLACEMENT (IOC) (Right: Eye)  Patient Location: Short Stay  Anesthesia Type:MAC  Level of Consciousness: awake, alert , and oriented  Airway & Oxygen Therapy: Patient Spontanous Breathing  Post-op Assessment: Report given to RN and Post -op Vital signs reviewed and stable  Post vital signs: Reviewed and stable  Last Vitals:  Vitals Value Taken Time  BP    Temp    Pulse    Resp    SpO2      Last Pain:  Vitals:   01/15/23 0940  TempSrc: Oral  PainSc: 0-No pain         Complications: No notable events documented.

## 2023-01-15 NOTE — Interval H&P Note (Signed)
 History and Physical Interval Note:  01/15/2023 10:09 AM  Laurie Bridges  has presented today for surgery, with the diagnosis of combined forms age related cataract, right eye.  The various methods of treatment have been discussed with the patient and family. After consideration of risks, benefits and other options for treatment, the patient has consented to  Procedure(s): CATARACT EXTRACTION PHACO AND INTRAOCULAR LENS PLACEMENT (IOC) (Right) as a surgical intervention.  The patient's history has been reviewed, patient examined, no change in status, stable for surgery.  I have reviewed the patient's chart and labs.  Questions were answered to the patient's satisfaction.     HARRIE AGENT

## 2023-01-15 NOTE — Op Note (Signed)
 Date of procedure: 01/15/23  Pre-operative diagnosis:  Visually significant combined form age-related cataract, Right Eye (H25.811)  Post-operative diagnosis:  Visually significant combined form age-related cataract, Right Eye (H25.811)  Procedure: Removal of cataract via phacoemulsification and insertion of intra-ocular lens Vicci and Johnson DIB00 +16.0D into the capsular bag of the Right Eye  Attending surgeon: Lynwood LABOR. Kale Dols, MD, MA  Anesthesia: MAC, Topical Akten   Complications: None  Estimated Blood Loss: <17mL (minimal)  Specimens: None  Implants: As above  Indications:  Visually significant age-related cataract, Right Eye  Procedure:  The patient was seen and identified in the pre-operative area. The operative eye was identified and dilated.  The operative eye was marked.  Topical anesthesia was administered to the operative eye.     The patient was then to the operative suite and placed in the supine position.  A timeout was performed confirming the patient, procedure to be performed, and all other relevant information.   The patient's face was prepped and draped in the usual fashion for intra-ocular surgery.  A lid speculum was placed into the operative eye and the surgical microscope moved into place and focused.  A superotemporal paracentesis was created using a 20 gauge paracentesis blade.  Shugarcaine was injected into the anterior chamber.  Viscoelastic was injected into the anterior chamber.  A temporal clear-corneal main wound incision was created using a 2.58mm microkeratome.  A continuous curvilinear capsulorrhexis was initiated using an irrigating cystitome and completed using capsulorrhexis forceps.  Hydrodissection and hydrodeliniation were performed.  Viscoelastic was injected into the anterior chamber.  A phacoemulsification handpiece and a chopper as a second instrument were used to remove the nucleus and epinucleus. The irrigation/aspiration handpiece was used to  remove any remaining cortical material.   The capsular bag was reinflated with viscoelastic, checked, and found to be intact.  The intraocular lens was inserted into the capsular bag.  The irrigation/aspiration handpiece was used to remove any remaining viscoelastic.  The clear corneal wound and paracentesis wounds were then hydrated and checked with Weck-Cels to be watertight. 0.1mL of Moxfloxacin was injected into the anterior chamber. The lid-speculum was removed.  The drape was removed.  The patient's face was cleaned with a wet and dry 4x4. A clear shield was taped over the eye. The patient was taken to the post-operative care unit in good condition, having tolerated the procedure well.  Post-Op Instructions: The patient will follow up at [facility] for a same day post-operative evaluation and will receive all other orders and instructions.

## 2023-01-15 NOTE — Anesthesia Preprocedure Evaluation (Signed)
 Anesthesia Evaluation  Patient identified by MRN, date of birth, ID band Patient awake    Reviewed: Allergy & Precautions, H&P , NPO status , Patient's Chart, lab work & pertinent test results, reviewed documented beta blocker date and time   History of Anesthesia Complications (+) PONV and history of anesthetic complications  Airway Mallampati: II  TM Distance: >3 FB Neck ROM: full    Dental no notable dental hx.    Pulmonary shortness of breath   Pulmonary exam normal breath sounds clear to auscultation       Cardiovascular Exercise Tolerance: Good hypertension, + CAD   Rhythm:regular Rate:Normal     Neuro/Psych   Anxiety     negative neurological ROS  negative psych ROS   GI/Hepatic Neg liver ROS,GERD  ,,  Endo/Other  negative endocrine ROS    Renal/GU negative Renal ROS  negative genitourinary   Musculoskeletal   Abdominal   Peds  Hematology  (+) Blood dyscrasia, anemia   Anesthesia Other Findings   Reproductive/Obstetrics negative OB ROS                              Anesthesia Physical Anesthesia Plan  ASA: 3  Anesthesia Plan: MAC   Post-op Pain Management:    Induction:   PONV Risk Score and Plan:   Airway Management Planned:   Additional Equipment:   Intra-op Plan:   Post-operative Plan:   Informed Consent: I have reviewed the patients History and Physical, chart, labs and discussed the procedure including the risks, benefits and alternatives for the proposed anesthesia with the patient or authorized representative who has indicated his/her understanding and acceptance.     Dental Advisory Given  Plan Discussed with: CRNA  Anesthesia Plan Comments:          Anesthesia Quick Evaluation

## 2023-01-18 ENCOUNTER — Encounter (HOSPITAL_COMMUNITY): Payer: Self-pay | Admitting: Ophthalmology

## 2023-01-21 DIAGNOSIS — H25812 Combined forms of age-related cataract, left eye: Secondary | ICD-10-CM | POA: Diagnosis not present

## 2023-01-22 NOTE — Anesthesia Postprocedure Evaluation (Signed)
 Anesthesia Post Note  Patient: Laurie Bridges  Procedure(s) Performed: CATARACT EXTRACTION PHACO AND INTRAOCULAR LENS PLACEMENT (IOC) (Right: Eye)  Patient location during evaluation: Phase II Anesthesia Type: MAC Level of consciousness: awake Pain management: pain level controlled Vital Signs Assessment: post-procedure vital signs reviewed and stable Respiratory status: spontaneous breathing and respiratory function stable Cardiovascular status: blood pressure returned to baseline and stable Postop Assessment: no headache and no apparent nausea or vomiting Anesthetic complications: no Comments: Late entry   No notable events documented.   Last Vitals:  Vitals:   01/15/23 0940 01/15/23 1035  BP: (!) 153/85 118/68  Pulse: 64 67  Resp: (!) 9 12  Temp: 36.6 C 36.5 C  SpO2: 98% 99%    Last Pain:  Vitals:   01/15/23 1035  TempSrc: Oral  PainSc: 0-No pain                 [name]

## 2023-01-25 NOTE — H&P (Signed)
 Surgical History & Physical  Patient Name: Laurie Bridges  DOB: July 03, 1945  Surgery: Cataract extraction with intraocular lens implant phacoemulsification; Left Eye Surgeon: Lynwood Hermann MD Surgery Date: 01/29/2023 Pre-Op Date: 01/21/2023  HPI: A 69 Yr. old female patient 1. The patient is returning after cataract surgery. The right eye is affected. Status post cataract surgery, which began 6 days ago: Since the last visit, the affected area is doing well. The patient's vision is improved. Patient is following medication instructions. The patient states she has noticed what looks like a soap bubble. 2.  The patient is returning for a cataract follow-up of the left eye. Since the last visit, the affected area is tolerating. The patient's vision is blurry. The condition's severity is constant. The complaint is associated with difficulty reading traffic and street signs, difficulty seeing captions on tv, and difficulty reading small print on medicine bottles/labels. This is negatively affecting the patient's quality of life and the patient is unable to function adequately in life with the current level of vision. HPI Completed by Dr. Lynwood Hermann  Medical History: Dry Eyes Cataracts  Acid reflux  LDL  Review of Systems Cataracts, dry eyes Eyes Allergic/Immunologic Seasonal Allergies All recorded systems are negative except as noted above.  Social Never smoked  Medication Systane, Restasis, Prednisolone-moxiflox-bromfen,  sertraline ,  dexlansoprazole  ,  ipratropium bromide ,  rosuvastatin  ,  Fluad Triv [id number] up)(PF) ,  lorazepam   Sx/Procedures Phaco c IOL OD,  Hip Replacement  Drug Allergies  latex ,  pyridium ,  feldene ,  elemental sulfur  ,  Demerol  ,  sulfa ,  codeine   History & Physical: Heent: cataract  NECK: supple without bruits LUNGS: lungs clear to auscultation CV: regular rate and rhythm Abdomen: soft and non-tender  Impression & Plan: Assessment: 1.   CATARACT EXTRACTION STATUS; Right Eye (Z98.41) 2.  COMBINED FORMS AGE RELATED CATARACT; Left Eye (H25.812) 3.  INTRAOCULAR LENS IOL ; Right Eye (Z96.1)  Plan: 1.  6 days after cataract surgery. Doing well with improved vision and normal eye pressure. Call with any problems or concerns. Continue Pred-Moxi-Brom 3x/day for 1 more day then 2x/day for 3 more weeks.  2.  Cataract accounts for the patient's decreased vision. This visual impairment is not correctable with a tolerable change in glasses or contact lenses. Cataract surgery with an implantation of a new lens should significantly improve the visual and functional status of the patient. Discussed all risks, benefits, alternatives, and potential complications. Discussed the procedures and recovery. Patient desires to have surgery. A-scan ordered and performed today for intra-ocular lens calculations. The surgery will be performed in order to improve vision for driving, reading, and for eye examinations. Recommend phacoemulsification with intra-ocular lens. Recommend Dextenza  for post-operative pain and inflammation. Left Eye. Surgery required to correct imbalance of vision. Dilates well - shugarcaine by protocol.  3.  Doing well since surgery Continue Post-op medications

## 2023-01-27 ENCOUNTER — Encounter (HOSPITAL_COMMUNITY)
Admission: RE | Admit: 2023-01-27 | Discharge: 2023-01-27 | Disposition: A | Discharge status: Home / Self Care | Payer: Medicare PPO | Source: Ambulatory Visit | Attending: Ophthalmology | Admitting: Ophthalmology

## 2023-01-27 ENCOUNTER — Encounter (HOSPITAL_COMMUNITY): Payer: Self-pay

## 2023-01-29 ENCOUNTER — Ambulatory Visit (HOSPITAL_COMMUNITY): Payer: Medicare PPO | Admitting: Certified Registered"

## 2023-01-29 ENCOUNTER — Ambulatory Visit (HOSPITAL_BASED_OUTPATIENT_CLINIC_OR_DEPARTMENT_OTHER): Payer: Medicare PPO | Admitting: Certified Registered"

## 2023-01-29 ENCOUNTER — Encounter (HOSPITAL_COMMUNITY): Payer: Self-pay | Admitting: Ophthalmology

## 2023-01-29 ENCOUNTER — Ambulatory Visit (HOSPITAL_COMMUNITY)
Admission: RE | Admit: 2023-01-29 | Discharge: 2023-01-29 | Disposition: A | Discharge status: Home / Self Care | Payer: Medicare PPO | Source: Ambulatory Visit | Attending: Ophthalmology | Admitting: Ophthalmology

## 2023-01-29 ENCOUNTER — Encounter (HOSPITAL_COMMUNITY)
Admission: RE | Disposition: A | Discharge status: Home / Self Care | Payer: Self-pay | Source: Ambulatory Visit | Attending: Ophthalmology

## 2023-01-29 DIAGNOSIS — H25812 Combined forms of age-related cataract, left eye: Secondary | ICD-10-CM

## 2023-01-29 DIAGNOSIS — Z961 Presence of intraocular lens: Secondary | ICD-10-CM | POA: Insufficient documentation

## 2023-01-29 DIAGNOSIS — I10 Essential (primary) hypertension: Secondary | ICD-10-CM

## 2023-01-29 DIAGNOSIS — I2510 Atherosclerotic heart disease of native coronary artery without angina pectoris: Secondary | ICD-10-CM | POA: Insufficient documentation

## 2023-01-29 DIAGNOSIS — F419 Anxiety disorder, unspecified: Secondary | ICD-10-CM | POA: Diagnosis not present

## 2023-01-29 DIAGNOSIS — K219 Gastro-esophageal reflux disease without esophagitis: Secondary | ICD-10-CM | POA: Insufficient documentation

## 2023-01-29 DIAGNOSIS — Z9841 Cataract extraction status, right eye: Secondary | ICD-10-CM | POA: Diagnosis not present

## 2023-01-29 HISTORY — PX: CATARACT EXTRACTION W/PHACO: SHX586

## 2023-01-29 LAB — GLUCOSE, CAPILLARY: Glucose-Capillary: 75 mg/dL (ref 70–99)

## 2023-01-29 SURGERY — PHACOEMULSIFICATION, CATARACT, WITH IOL INSERTION
Anesthesia: Monitor Anesthesia Care | Site: Eye | Laterality: Left

## 2023-01-29 MED ORDER — SODIUM CHLORIDE 0.9% FLUSH
INTRAVENOUS | Status: DC | PRN
  Administered 2023-01-29: 7 mL via INTRAVENOUS

## 2023-01-29 MED ORDER — SODIUM HYALURONATE 10 MG/ML IO SOLUTION
PREFILLED_SYRINGE | INTRAOCULAR | Status: DC | PRN
  Administered 2023-01-29: .85 mL via INTRAOCULAR

## 2023-01-29 MED ORDER — SODIUM HYALURONATE 23MG/ML IO SOSY
PREFILLED_SYRINGE | INTRAOCULAR | Status: DC | PRN
  Administered 2023-01-29: .6 mL via INTRAOCULAR

## 2023-01-29 MED ORDER — STERILE WATER FOR IRRIGATION IR SOLN
Status: DC | PRN
  Administered 2023-01-29: 250 mL

## 2023-01-29 MED ORDER — TETRACAINE HCL 0.5 % OP SOLN
1.0000 [drp] | OPHTHALMIC | Status: AC | PRN
Start: 2023-01-29 — End: 2023-01-29
  Administered 2023-01-29 (×3): 1 [drp] via OPHTHALMIC

## 2023-01-29 MED ORDER — PHENYLEPHRINE HCL 2.5 % OP SOLN
1.0000 [drp] | OPHTHALMIC | Status: AC | PRN
  Administered 2023-01-29 (×3): 1 [drp] via OPHTHALMIC

## 2023-01-29 MED ORDER — MIDAZOLAM HCL 2 MG/2ML IJ SOLN
INTRAMUSCULAR | Status: AC
  Filled 2023-01-29: qty 2

## 2023-01-29 MED ORDER — LIDOCAINE HCL (PF) 1 % IJ SOLN
INTRAOCULAR | Status: DC | PRN
  Administered 2023-01-29: 1 mL via OPHTHALMIC

## 2023-01-29 MED ORDER — MOXIFLOXACIN HCL 5 MG/ML IO SOLN
INTRAOCULAR | Status: DC | PRN
  Administered 2023-01-29: .2 mL via INTRACAMERAL

## 2023-01-29 MED ORDER — MIDAZOLAM HCL 2 MG/2ML IJ SOLN
INTRAMUSCULAR | Status: DC | PRN
  Administered 2023-01-29: 1 mg via INTRAVENOUS

## 2023-01-29 MED ORDER — EPINEPHRINE PF 1 MG/ML IJ SOLN
INTRAOCULAR | Status: DC | PRN
  Administered 2023-01-29: 500 mL

## 2023-01-29 MED ORDER — LIDOCAINE HCL 3.5 % OP GEL
1.0000 | Freq: Once | OPHTHALMIC | Status: AC
  Administered 2023-01-29: 1 via OPHTHALMIC

## 2023-01-29 MED ORDER — POVIDONE-IODINE 5 % OP SOLN
OPHTHALMIC | Status: DC | PRN
  Administered 2023-01-29: 1 via OPHTHALMIC

## 2023-01-29 MED ORDER — BSS IO SOLN
INTRAOCULAR | Status: DC | PRN
  Administered 2023-01-29: 15 mL via INTRAOCULAR

## 2023-01-29 MED ORDER — TROPICAMIDE 1 % OP SOLN
1.0000 [drp] | OPHTHALMIC | Status: AC | PRN
  Administered 2023-01-29 (×3): 1 [drp] via OPHTHALMIC

## 2023-01-29 SURGICAL SUPPLY — 13 items
CATARACT SUITE SIGHTPATH (MISCELLANEOUS) ×1
CLOTH BEACON ORANGE TIMEOUT ST (SAFETY) ×1 IMPLANT
EYE SHIELD UNIVERSAL CLEAR (GAUZE/BANDAGES/DRESSINGS) IMPLANT
FEE CATARACT SUITE SIGHTPATH (MISCELLANEOUS) ×1 IMPLANT
GLOVE BIOGEL PI IND STRL 7.0 (GLOVE) ×2 IMPLANT
LENS IOL TECNIS EYHANCE 18.0 (Intraocular Lens) IMPLANT
NDL HYPO 18GX1.5 BLUNT FILL (NEEDLE) ×1 IMPLANT
NEEDLE HYPO 18GX1.5 BLUNT FILL (NEEDLE) ×1
PAD ARMBOARD 7.5X6 YLW CONV (MISCELLANEOUS) ×1 IMPLANT
RING MALYGIN 7.0 (MISCELLANEOUS) IMPLANT
SYR TB 1ML LL NO SAFETY (SYRINGE) ×1 IMPLANT
TAPE PAPER 2X10 WHT MICROPORE (GAUZE/BANDAGES/DRESSINGS) IMPLANT
WATER STERILE IRR 250ML POUR (IV SOLUTION) ×1 IMPLANT

## 2023-01-29 NOTE — Discharge Instructions (Addendum)
Please discharge patient when stable, will follow up today with Dr. Carnella Fryman at the [facility] office immediately following discharge.  Leave shield in place until visit.  All paperwork with discharge instructions will be given at the office.  [facility] Address:  [address]  [place], [place] [address]  

## 2023-01-29 NOTE — Op Note (Signed)
Date of procedure: 01/29/23  Pre-operative diagnosis: Visually significant age-related combined cataract, Left Eye (H25.812)  Post-operative diagnosis: Visually significant age-related combined cataract, Left Eye (H25.812)  Procedure: Removal of cataract via phacoemulsification and insertion of intra-ocular lens Johnson and Johnson DIB00 +18.0D into the capsular bag of the Left Eye  Attending surgeon: Rudy Jew. Durrell Barajas, MD, MA  Anesthesia: MAC, Topical Akten  Complications: None  Estimated Blood Loss: <60mL (minimal)  Specimens: None  Implants: As above  Indications:  Visually significant age-related cataract, Left Eye  Procedure:  The patient was seen and identified in the pre-operative area. The operative eye was identified and dilated.  The operative eye was marked.  Topical anesthesia was administered to the operative eye.     The patient was then to the operative suite and placed in the supine position.  A timeout was performed confirming the patient, procedure to be performed, and all other relevant information.   The patient's face was prepped and draped in the usual fashion for intra-ocular surgery.  A lid speculum was placed into the operative eye and the surgical microscope moved into place and focused.  An inferotemporal paracentesis was created using a 20 gauge paracentesis blade.  Shugarcaine was injected into the anterior chamber.  Viscoelastic was injected into the anterior chamber.  A temporal clear-corneal main wound incision was created using a 2.49mm microkeratome.  A continuous curvilinear capsulorrhexis was initiated using an irrigating cystitome and completed using capsulorrhexis forceps.  Hydrodissection and hydrodeliniation were performed.  Viscoelastic was injected into the anterior chamber.  A phacoemulsification handpiece and a chopper as a second instrument were used to remove the nucleus and epinucleus. The irrigation/aspiration handpiece was used to remove any  remaining cortical material.   The capsular bag was reinflated with viscoelastic, checked, and found to be intact.  The intraocular lens was inserted into the capsular bag.  The irrigation/aspiration handpiece was used to remove any remaining viscoelastic.  The clear corneal wound and paracentesis wounds were then hydrated and checked with Weck-Cels to be watertight. 0.28mL of Moxfloxacin was injected into the anterior chamber. The lid-speculum was removed.  The drape was removed.  The patient's face was cleaned with a wet and dry 4x4.    A clear shield was taped over the eye. The patient was taken to the post-operative care unit in good condition, having tolerated the procedure well.  Post-Op Instructions: The patient will follow up at [facility] for a same day post-operative evaluation and will receive all other orders and instructions.

## 2023-01-29 NOTE — Anesthesia Postprocedure Evaluation (Signed)
Anesthesia Post Note  Patient: Laurie Bridges  Procedure(s) Performed: CATARACT EXTRACTION PHACO AND INTRAOCULAR LENS PLACEMENT (IOC) (Left: Eye)  Patient location during evaluation: PACU Anesthesia Type: MAC Level of consciousness: awake and alert Pain management: pain level controlled Vital Signs Assessment: post-procedure vital signs reviewed and stable Respiratory status: spontaneous breathing, nonlabored ventilation, respiratory function stable and patient connected to nasal cannula oxygen Cardiovascular status: stable and blood pressure returned to baseline Postop Assessment: no apparent nausea or vomiting Anesthetic complications: no   There were no known notable events for this encounter.   Last Vitals:  Vitals:   01/29/23 0914 01/29/23 1038  BP: 127/85 125/72  Pulse: 66 72  Resp: 12 14  Temp: 36.6 C 36.6 C  SpO2: 98% 100%    Last Pain:  Vitals:   01/29/23 1038  TempSrc: Oral  PainSc: 0-No pain                 Lori-Ann Lindfors L Eiko Mcgowen

## 2023-01-29 NOTE — Transfer of Care (Signed)
Immediate Anesthesia Transfer of Care Note  Patient: Laurie Bridges  Procedure(s) Performed: CATARACT EXTRACTION PHACO AND INTRAOCULAR LENS PLACEMENT (IOC) (Left: Eye)  Patient Location: Short Stay  Anesthesia Type:MAC  Level of Consciousness: awake, alert , and oriented  Airway & Oxygen Therapy: Patient Spontanous Breathing  Post-op Assessment: Report given to RN and Post -op Vital signs reviewed and stable  Post vital signs: Reviewed and stable  Last Vitals:  Vitals Value Taken Time  BP    Temp    Pulse    Resp    SpO2      Last Pain:  Vitals:   01/29/23 0914  TempSrc: Oral  PainSc: 0-No pain         Complications: No notable events documented.

## 2023-01-29 NOTE — Interval H&P Note (Signed)
History and Physical Interval Note:  01/29/2023 10:13 AM  Laurie Bridges  has presented today for surgery, with the diagnosis of combined forms age related cataract, left eye.  The various methods of treatment have been discussed with the patient and family. After consideration of risks, benefits and other options for treatment, the patient has consented to  Procedure(s): CATARACT EXTRACTION PHACO AND INTRAOCULAR LENS PLACEMENT (IOC) (Left) as a surgical intervention.  The patient's history has been reviewed, patient examined, no change in status, stable for surgery.  I have reviewed the patient's chart and labs.  Questions were answered to the patient's satisfaction.     Fabio Pierce

## 2023-01-29 NOTE — Anesthesia Preprocedure Evaluation (Signed)
Anesthesia Evaluation  Patient identified by MRN, date of birth, ID band Patient awake    Reviewed: Allergy & Precautions, H&P , NPO status , Patient's Chart, lab work & pertinent test results, reviewed documented beta blocker date and time   History of Anesthesia Complications (+) PONV and history of anesthetic complications  Airway Mallampati: II  TM Distance: >3 FB Neck ROM: full    Dental  (+) Dental Advisory Given, Partial Upper One tooth partial upper right front:   Pulmonary shortness of breath   Pulmonary exam normal breath sounds clear to auscultation       Cardiovascular Exercise Tolerance: Good hypertension, + CAD  Normal cardiovascular exam Rhythm:regular Rate:Normal     Neuro/Psych   Anxiety     negative neurological ROS  negative psych ROS   GI/Hepatic Neg liver ROS,GERD  ,,  Endo/Other  Pre diabetes  Renal/GU negative Renal ROS  negative genitourinary   Musculoskeletal  (+) Arthritis , Osteoarthritis,    Abdominal Normal abdominal exam  (+)   Peds  Hematology  (+) Blood dyscrasia, anemia   Anesthesia Other Findings   Reproductive/Obstetrics negative OB ROS                             Anesthesia Physical Anesthesia Plan  ASA: 3  Anesthesia Plan: MAC   Post-op Pain Management:    Induction:   PONV Risk Score and Plan:   Airway Management Planned: Nasal Cannula and Natural Airway  Additional Equipment: None  Intra-op Plan:   Post-operative Plan:   Informed Consent: I have reviewed the patients History and Physical, chart, labs and discussed the procedure including the risks, benefits and alternatives for the proposed anesthesia with the patient or authorized representative who has indicated his/her understanding and acceptance.     Dental Advisory Given  Plan Discussed with: CRNA  Anesthesia Plan Comments:         Anesthesia Quick Evaluation

## 2023-01-29 NOTE — Anesthesia Procedure Notes (Signed)
Procedure Name: MAC Date/Time: 01/29/2023 10:17 AM  Performed by: Julian Reil, CRNAPre-anesthesia Checklist: Patient identified, Emergency Drugs available, Suction available and Patient being monitored Patient Re-evaluated:Patient Re-evaluated prior to induction Oxygen Delivery Method: Nasal cannula Placement Confirmation: positive ETCO2

## 2023-02-01 ENCOUNTER — Encounter (HOSPITAL_COMMUNITY): Payer: Self-pay | Admitting: Ophthalmology

## 2023-02-03 ENCOUNTER — Encounter (INDEPENDENT_AMBULATORY_CARE_PROVIDER_SITE_OTHER): Payer: Self-pay | Admitting: Gastroenterology

## 2023-02-14 NOTE — Progress Notes (Signed)
 Cardiology Office Note   Date:  02/16/2023   ID:  Laurie Bridges, DOB 09-Apr-1945, MRN [id number]  PCP:  Sheryle Carwin, MD  Cardiologist:   Vina Gull, MD   Pt  presents for follow up of CAD     History of Present Illness: Laurie Bridges is a 78 y.o. female with a history of CAD (s/p coronary CCTA in June 2020 showing mild narrowing in mid,LAD, mid LCx and mid RCA), HTN, MR  She was last seen by B Lovings in Aug 2021    I sw the pt in Sept 2023 The pt denies CP  Breathing is good   Activity is limited by leg weakness (L)    L groin hurts with walking   Had achiness so in November stopped Crstor   Feeling better now    Also had achiness when on lipitor in past though it was not as bad then     Admits to eating carbs    Has gastroparesis  Has to avoid certain foods   Current Meds  Medication Sig   aspirin  EC 81 MG tablet Take 1 tablet (81 mg total) by mouth at bedtime.   Cholecalciferol (VITAMIN D-3) 125 MCG (5000 UT) TABS Take by mouth daily.   dexlansoprazole  (DEXILANT ) 60 MG capsule TAKE ONE CAPSULE BY MOUTH ONCE DAILY.   fluticasone  (FLONASE ) 50 MCG/ACT nasal spray Place 2 sprays into both nostrils daily.   GLUCOSAMINE-CALCIUM -VIT D PO Take by mouth.   guaiFENesin  (MUCINEX  PO) Take by mouth as needed. Takes as needed.   LORazepam (ATIVAN) 1 MG tablet Take 1 mg by mouth at bedtime.   ondansetron  (ZOFRAN ) 4 MG tablet Take 1 tablet (4 mg total) by mouth every 8 (eight) hours as needed for nausea or vomiting.   Polyethyl Glycol-Propyl Glycol (SYSTANE OP) Apply to eye. One drop each eye twice daily   Polyethylene Glycol 3350  (MIRALAX  PO) Take by mouth as needed.   rosuvastatin  (CRESTOR ) 40 MG tablet TAKE ONE TABLET BY MOUTH EVERY DAY   sertraline (ZOLOFT) 25 MG tablet Take 25 mg by mouth daily.     Allergies:   Codeine, Demerol , Elemental sulfur , Feldene [piroxicam], Latex, and Pyridium [phenazopyridine hcl]   Past Medical History:  Diagnosis Date   Anemia    hx of Fe  def many years ago   Anxiety    Arthritis    back   CAD (coronary artery disease)    a. s/p Coronary CT in 06/2018 showing mild stenosis along the mid-LAD, mid-LCx and mid-RCA   GERD (gastroesophageal reflux disease)    Hypertension    PONV (postoperative nausea and vomiting)    Pre-diabetes     Past Surgical History:  Procedure Laterality Date   ABDOMINAL HYSTERECTOMY     partial hysterectomy, ovaries remain   BACK SURGERY     BIOPSY  12/29/2022   Procedure: BIOPSY;  Surgeon: Cinderella Deatrice FALCON, MD;  [facility]: [facility];  Service: Endoscopy;;   CATARACT EXTRACTION W/PHACO Right 01/15/2023   Procedure: CATARACT EXTRACTION PHACO AND INTRAOCULAR LENS PLACEMENT (IOC);  Surgeon: Harrie Agent, MD;  Location: [facility];  Service: Ophthalmology;  Laterality: Right;  CDE 6.32   CATARACT EXTRACTION W/PHACO Left 01/29/2023   Procedure: CATARACT EXTRACTION PHACO AND INTRAOCULAR LENS PLACEMENT (IOC);  Surgeon: Harrie Agent, MD;  Location: [facility];  Service: Ophthalmology;  Laterality: Left;  CDE: 7.39   CHOLECYSTECTOMY N/A 11/27/2019   Procedure: LAPAROSCOPIC CHOLECYSTECTOMY;  Surgeon: Kallie Manuelita BROCKS, MD;  Location:  [facility];  Service: General;  Laterality: N/A;   COLONOSCOPY N/A 02/29/2020   Procedure: COLONOSCOPY;  Surgeon: Golda Claudis PENNER, MD;  [facility]: [facility];  Service: Endoscopy;  Laterality: N/A;  8:45   ESOPHAGEAL DILATION N/A 11/17/2017   Procedure: ESOPHAGEAL DILATION;  Surgeon: Golda Claudis PENNER, MD;  [facility];  Service: Endoscopy;  Laterality: N/A;   ESOPHAGOGASTRODUODENOSCOPY N/A 11/17/2017   Procedure: ESOPHAGOGASTRODUODENOSCOPY (EGD);  Surgeon: Golda Claudis PENNER, MD;  [facility];  Service: Endoscopy;  Laterality: N/A;  12:00   ESOPHAGOGASTRODUODENOSCOPY N/A 11/02/2019   Procedure: ESOPHAGOGASTRODUODENOSCOPY (EGD);  Surgeon: Golda Claudis PENNER, MD;  [facility];  Service: Endoscopy;  Laterality: N/A;  945   ESOPHAGOGASTRODUODENOSCOPY  (EGD) WITH PROPOFOL  N/A 12/29/2022   Procedure: ESOPHAGOGASTRODUODENOSCOPY (EGD) WITH PROPOFOL ;  Surgeon: Cinderella Deatrice FALCON, MD;  [facility];  Service: Endoscopy;  Laterality: N/A;  11:15AM;ASA 3   FOOT SURGERY     POLYPECTOMY  02/29/2020   Procedure: POLYPECTOMY;  Surgeon: Golda Claudis PENNER, MD;  [facility];  Service: Endoscopy;;   REVISION TOTAL HIP ARTHROPLASTY Left      Social History:  The patient  reports that she has never smoked. She has been exposed to tobacco smoke. She has never used smokeless tobacco. She reports that she does not drink alcohol  and does not use drugs.   Family History:  The patient's family history includes Cancer in her maternal uncle, paternal uncle, and paternal uncle; Neuropathy in her child and father; Stroke in her father and mother.    ROS:  Please see the history of present illness. All other systems are reviewed and  Negative to the above problem except as noted.    PHYSICAL EXAM: VS:  BP 132/84   Pulse 68   Ht 5' 8 (1.727 m)   Wt 147 lb 12.8 oz (67 kg)   SpO2 99%   BMI 22.47 kg/m   Pt is in NAD  HEENT: normal  Neck: no JVD, carotid bruit Cardiac: RRR; no murmurs. No LE edema  Respiratory  CTA   GI: soft, nontender, No hepatomegaly  MS: no deformity Moving all extremities   2+ PT pulses   EKG:  EKG not done    Lipid Panel    Component Value Date/Time   CHOL 139 10/26/2019 1511   TRIG 97 10/26/2019 1511   HDL 61 10/26/2019 1511   CHOLHDL 2.3 10/26/2019 1511   LDLCALC 60 10/26/2019 1511      Wt Readings from Last 3 Encounters:  02/16/23 147 lb 12.8 oz (67 kg)  01/29/23 151 lb 3.8 oz (68.6 kg)  01/15/23 151 lb 3.8 oz (68.6 kg)      ASSESSMENT AND PLAN:   1  CAD   Mild CAD on CT scan in 2020    No symptoms of angina    Follow    Work on risk factors   2  COPD  Moderate on PFTs  3  HL  Intolerant to Crestor  and lipitor   Will get lipomed off of meds  Review with pharmacy other options     4   HTN  Fair control     5  Metabolics   A1C 5.9    Diet is challenging given gastroparesis  Increase activity      Hip issues   Would recomm stationary bike, chair yoga    Follow up in clinic in 1 year   Current medicines are reviewed at  length with the patient today.  The patient does not have concerns regarding medicines.  Signed, Vina Gull, MD  02/16/2023 9:20 AM    [facility] [address] [place], [place], [place]  [address] Phone: [phone]; Fax: [phone]

## 2023-02-15 ENCOUNTER — Ambulatory Visit (INDEPENDENT_AMBULATORY_CARE_PROVIDER_SITE_OTHER): Payer: Medicare PPO | Admitting: Gastroenterology

## 2023-02-16 ENCOUNTER — Encounter: Payer: Self-pay | Admitting: Internal Medicine

## 2023-02-16 ENCOUNTER — Ambulatory Visit: Payer: Medicare PPO | Attending: Internal Medicine | Admitting: Internal Medicine

## 2023-02-16 VITALS — BP 132/84 | HR 68 | Ht 68.0 in | Wt 147.8 lb

## 2023-02-16 DIAGNOSIS — Z79899 Other long term (current) drug therapy: Secondary | ICD-10-CM | POA: Diagnosis not present

## 2023-02-16 DIAGNOSIS — Z13220 Encounter for screening for lipoid disorders: Secondary | ICD-10-CM

## 2023-02-16 NOTE — Patient Instructions (Signed)
 Medication Instructions:   *If you need a refill on your cardiac medications before your next appointment, please call your pharmacy*   Lab Work: Nmr, [id number] If you have labs (blood work) drawn today and your tests are completely normal, you will receive your results only by: MyChart Message (if you have MyChart) OR A paper copy in the mail If you have any lab test that is abnormal or we need to change your treatment, we will call you to review the results.   Testing/Procedures:    Follow-Up: At [facility], you and your health needs are our priority.  As part of our continuing mission to provide you with exceptional heart care, we have created designated Provider Care Teams.  These Care Teams include your primary Cardiologist (physician) and Advanced Practice Providers (APPs -  Physician Assistants and Nurse Practitioners) who all work together to provide you with the care you need, when you need it.  We recommend signing up for the patient portal called MyChart.  Sign up information is provided on this After Visit Summary.  MyChart is used to connect with patients for Virtual Visits (Telemedicine).  Patients are able to view lab/test results, encounter notes, upcoming appointments, etc.  Non-urgent messages can be sent to your provider as well.   To learn more about what you can do with MyChart, go to [email or web address].    Your next appointment:  one year

## 2023-02-17 LAB — NMR, LIPOPROFILE
Cholesterol, Total: 303 mg/dL — ABNORMAL HIGH (ref 100–199)
HDL Particle Number: 31.2 umol/L (ref >=30.5)
HDL-C: 62 mg/dL (ref >39)
LDL Particle Number: 2200 nmol/L — ABNORMAL HIGH (ref <1000)
LDL Size: 21.8 nmol (ref >20.5)
LDL-C (NIH Calc): 207 mg/dL — ABNORMAL HIGH (ref 0–99)
LP-IR Score: <25 (ref <=45)
Small LDL Particle Number: 496 nmol/L (ref <=527)
Triglycerides: 178 mg/dL — ABNORMAL HIGH (ref 0–149)

## 2023-02-17 LAB — HEMOGLOBIN A1C
Est. average glucose Bld gHb Est-mCnc: 117 mg/dL
Hgb A1c MFr Bld: 5.7 % — ABNORMAL HIGH (ref 4.8–5.6)

## 2023-02-19 ENCOUNTER — Other Ambulatory Visit: Payer: Self-pay

## 2023-02-19 DIAGNOSIS — Z79899 Other long term (current) drug therapy: Secondary | ICD-10-CM

## 2023-02-19 DIAGNOSIS — Z13220 Encounter for screening for lipoid disorders: Secondary | ICD-10-CM

## 2023-02-19 DIAGNOSIS — E782 Mixed hyperlipidemia: Secondary | ICD-10-CM

## 2023-02-23 MED ORDER — ROSUVASTATIN CALCIUM 40 MG PO TABS: 40.0000 mg | ORAL_TABLET | Freq: Every day | ORAL | 3 refills | Status: DC

## 2023-02-24 ENCOUNTER — Other Ambulatory Visit (INDEPENDENT_AMBULATORY_CARE_PROVIDER_SITE_OTHER): Payer: Self-pay | Admitting: Gastroenterology

## 2023-03-01 ENCOUNTER — Ambulatory Visit (INDEPENDENT_AMBULATORY_CARE_PROVIDER_SITE_OTHER): Payer: Medicare PPO | Admitting: Gastroenterology

## 2023-03-02 ENCOUNTER — Encounter (INDEPENDENT_AMBULATORY_CARE_PROVIDER_SITE_OTHER): Payer: Self-pay | Admitting: Gastroenterology

## 2023-03-02 ENCOUNTER — Ambulatory Visit (INDEPENDENT_AMBULATORY_CARE_PROVIDER_SITE_OTHER): Payer: Medicare PPO | Admitting: Gastroenterology

## 2023-03-02 VITALS — BP 128/77 | HR 76 | Temp 98.0°F | Ht 68.0 in | Wt 153.3 lb

## 2023-03-02 DIAGNOSIS — K5900 Constipation, unspecified: Secondary | ICD-10-CM

## 2023-03-02 DIAGNOSIS — K219 Gastro-esophageal reflux disease without esophagitis: Secondary | ICD-10-CM

## 2023-03-02 DIAGNOSIS — D509 Iron deficiency anemia, unspecified: Secondary | ICD-10-CM

## 2023-03-02 DIAGNOSIS — K3184 Gastroparesis: Secondary | ICD-10-CM | POA: Diagnosis not present

## 2023-03-02 DIAGNOSIS — R790 Abnormal level of blood mineral: Secondary | ICD-10-CM

## 2023-03-02 NOTE — Progress Notes (Signed)
 Referring Provider: Carylon Perches, MD Primary Care Physician:  Carylon Perches, MD Primary GI Physician: Dr. Tasia Catchings   Chief Complaint  Patient presents with   Nausea    Follow up on nausea. Still having some nausea. Takes zofran.    Constipation    Would like to discuss constipation. Takes miralax.     HPI:   ROSELANI GRAJEDA is a 78 y.o. female with past medical history of  anemia, anxiety, arthritis, CAD, GERD, HTN, gatroparesis   Patient presenting today for follow up of nausea, abdominal pain, Iron deficiency and constipation   Last seen December 2024, at that time reported nausea for the past 3 to 4 weeks and some abdominal discomfort.  Had stopped Reglan as she did not feel it was helping.  No heartburn or acid regurgitation.  On some recent course of antibiotics with no steroids.  Taking a baby aspirin daily.  Having intermittent looser stools since being on antibiotics.  Started on p.o. iron PCP in September she reports her ferritin had dropped 23.  Note stools have been dark since starting iron, denies bright red blood per rectum.  Patient recommended to undergo EGD, obtain labs from PCP, continue p.o. iron, consider colonoscopy if EGD unrevealing for source for low ferritin, Zofran 4 mg every 8 hours as needed for nausea  Notably labs done in October from PCP were reviewed with low normal ferritin of 40(previously 23 in July 2024), iron studies were done 12/25/2022 with iron 66, TIBC 286, saturation 23.  Hemoglobin 13.7  Present: Still having some nausea but taking zofran which seems to help. Appetite is good. She is trying to do smaller meals throughout the day. She was on reglan previously but trialed off of this as she was doing so well. Feels that after the EGD she was feeling better but over the last few weeks nausea seems to be worse. She has not had the epigastric pain she was having before and she has been off of her iron since prior to her EGD.    Having some constipation  which she is taking 1 capful of miralax for PRN. She will take this several days in a row when she is feeling more constipated and then stop once she is having BMs. Generally having a BM every few days. Denies rectal bleeding or melena. Trying to do more water intake, around 40 oz per day. Does not do a lot of fruit in her diet, minimal vegetables usually as well.    GES: 12/02/20: Normal gastric emptying in the first 3 hours with slight decrease in the fourth hour. Last Colonoscopy:02/2020 One diminutive polyp in the ascending colon.                            Biopsied-TA                            - External hemorrhoids.                           - Anal papilla(e) were hypertrophied.  Last Endoscopy:12/2022 normal esophagus, 2 cm hiatal hernia, erythematous mucosa in antrum biopsied, normal duodenal bulb and second portion of the duodenum STOMACH, RANDOM, BIOPSY:  Gastric antral / oxyntic mucosa with focal chronic inactive gastritis.  No H. pylori identified on HE stain.  Negative for intestinal metaplasia or dysplasia.  Past Medical History:  Diagnosis Date   Anemia    hx of Fe def many years ago   Anxiety    Arthritis    back   CAD (coronary artery disease)    a. s/p Coronary CT in 06/2018 showing mild stenosis along the mid-LAD, mid-LCx and mid-RCA   GERD (gastroesophageal reflux disease)    Hypertension    PONV (postoperative nausea and vomiting)    Pre-diabetes     Past Surgical History:  Procedure Laterality Date   ABDOMINAL HYSTERECTOMY     partial hysterectomy, ovaries remain   BACK SURGERY     BIOPSY  12/29/2022   Procedure: BIOPSY;  Surgeon: Franky Macho, MD;  [facility];  Service: Endoscopy;;   CATARACT EXTRACTION W/PHACO Right 01/15/2023   Procedure: CATARACT EXTRACTION PHACO AND INTRAOCULAR LENS PLACEMENT (IOC);  Surgeon: Fabio Pierce, MD;  Location: [facility];  Service: Ophthalmology;  Laterality: Right;  CDE 6.32   CATARACT EXTRACTION W/PHACO  Left 01/29/2023   Procedure: CATARACT EXTRACTION PHACO AND INTRAOCULAR LENS PLACEMENT (IOC);  Surgeon: Fabio Pierce, MD;  Location: [facility];  Service: Ophthalmology;  Laterality: Left;  CDE: 7.39   CHOLECYSTECTOMY N/A 11/27/2019   Procedure: LAPAROSCOPIC CHOLECYSTECTOMY;  Surgeon: Lucretia Roers, MD;  Location: [facility];  Service: General;  Laterality: N/A;   COLONOSCOPY N/A 02/29/2020   Procedure: COLONOSCOPY;  Surgeon: Malissa Hippo, MD;  [facility];  Service: Endoscopy;  Laterality: N/A;  8:45   ESOPHAGEAL DILATION N/A 11/17/2017   Procedure: ESOPHAGEAL DILATION;  Surgeon: Malissa Hippo, MD;  [facility];  Service: Endoscopy;  Laterality: N/A;   ESOPHAGOGASTRODUODENOSCOPY N/A 11/17/2017   Procedure: ESOPHAGOGASTRODUODENOSCOPY (EGD);  Surgeon: Malissa Hippo, MD;  [facility];  Service: Endoscopy;  Laterality: N/A;  12:00   ESOPHAGOGASTRODUODENOSCOPY N/A 11/02/2019   Procedure: ESOPHAGOGASTRODUODENOSCOPY (EGD);  Surgeon: Malissa Hippo, MD;  [facility];  Service: Endoscopy;  Laterality: N/A;  945   ESOPHAGOGASTRODUODENOSCOPY (EGD) WITH PROPOFOL N/A 12/29/2022   Procedure: ESOPHAGOGASTRODUODENOSCOPY (EGD) WITH PROPOFOL;  Surgeon: Franky Macho, MD;  [facility];  Service: Endoscopy;  Laterality: N/A;  11:15AM;ASA 3   FOOT SURGERY     POLYPECTOMY  02/29/2020   Procedure: POLYPECTOMY;  Surgeon: Malissa Hippo, MD;  [facility];  Service: Endoscopy;;   REVISION TOTAL HIP ARTHROPLASTY Left     Current Outpatient Medications  Medication Sig Dispense Refill   aspirin EC 81 MG tablet Take 1 tablet (81 mg total) by mouth at bedtime.     CINNAMON PO Take by mouth. 2,000 daily     dexlansoprazole (DEXILANT) 60 MG capsule TAKE (1) CAPSULE BY MOUTH ONCE DAILY. 30 capsule 11   fluticasone (FLONASE) 50 MCG/ACT nasal spray Place 2 sprays into both nostrils daily.     GLUCOSAMINE-CALCIUM-VIT D PO Take by mouth  daily.     guaiFENesin (MUCINEX PO) Take by mouth as needed. Takes as needed.     LORazepam (ATIVAN) 1 MG tablet Take 1 mg by mouth at bedtime.     Multiple Vitamin (MULTIVITAMIN) tablet Take 1 tablet by mouth daily.     ondansetron (ZOFRAN) 4 MG tablet Take 1 tablet (4 mg total) by mouth every 8 (eight) hours as needed for nausea or vomiting. 30 tablet 1   Polyethyl Glycol-Propyl Glycol (SYSTANE OP) Apply to eye. One drop each eye twice daily     Polyethylene Glycol 3350 (MIRALAX PO) Take by  mouth as needed.     rosuvastatin (CRESTOR) 40 MG tablet Take 1 tablet (40 mg total) by mouth daily. 90 tablet 3   sertraline (ZOLOFT) 25 MG tablet Take 25 mg by mouth daily.     No current facility-administered medications for this visit.    Allergies as of 03/02/2023 - Review Complete 03/02/2023  Allergen Reaction Noted   Codeine Other (See Comments) 05/27/2010   Demerol Rash 05/27/2010   Elemental sulfur Rash 05/27/2010   Feldene [piroxicam] Rash 05/27/2010   Latex Rash 05/27/2010   Pyridium [phenazopyridine hcl] Itching and Rash 05/27/2010    Family History  Problem Relation Age of Onset   Stroke Mother    Stroke Father    Neuropathy Father    Cancer Maternal Uncle    Cancer Paternal Uncle    Cancer Paternal Uncle    Neuropathy Child     Social History   Socioeconomic History   Marital status: Widowed    Spouse name: Not on file   Number of children: Not on file   Years of education: Not on file   Highest education level: Not on file  Occupational History   Not on file  Tobacco Use   Smoking status: Never    Passive exposure: Past   Smokeless tobacco: Never  Vaping Use   Vaping status: Never Used  Substance and Sexual Activity   Alcohol use: No   Drug use: No   Sexual activity: Never  Other Topics Concern   Not on file  Social History Narrative   Not on file   Social Drivers of Health   Financial Resource Strain: Not on file  Food Insecurity: Not on file   Transportation Needs: Not on file  Physical Activity: Not on file  Stress: Not on file  Social Connections: Not on file    Review of systems General: negative for malaise, night sweats, fever, chills, weight loss Neck: Negative for lumps, goiter, pain and significant neck swelling Resp: Negative for cough, wheezing, dyspnea at rest CV: Negative for chest pain, leg swelling, palpitations, orthopnea GI: denies melena, hematochezia, vomiting, diarrhea,  dysphagia, odyonophagia, early satiety or unintentional weight loss. +nausea +Constipation The remainder of the review of systems is noncontributory.  Physical Exam: BP 128/77   Pulse 76   Temp 98 F (36.7 C) (Oral)   Ht 5\' 8"  (1.727 m)   Wt 153 lb 4.8 oz (69.5 kg)   BMI 23.31 kg/m  General:   Alert and oriented. No distress noted. Pleasant and cooperative.  Head:  Normocephalic and atraumatic. Eyes:  Conjuctiva clear without scleral icterus. Mouth:  Oral mucosa pink and moist. Good dentition. No lesions. Heart: Normal rate and rhythm, s1 and s2 heart sounds present.  Lungs: Clear lung sounds in all lobes. Respirations equal and unlabored. Abdomen:  +BS, soft, non-tender and non-distended. No rebound or guarding. No HSM or masses noted. Neurologic:  Alert and  oriented x4 Psych:  Alert and cooperative. Normal mood and affect.  Invalid input(s): "6 MONTHS"   ASSESSMENT: NICHOEL DIGIULIO is a 78 y.o. female presenting today for follow up of nausea, abdominal pain, Iron deficiency and Constipation   Gastroparesis/epigastric pain: history of gastroparesis, previously managed with Reglan though this was stopped a while back as she was doing well and symptoms were mostly managed with diet.  She has been feeling better since her EGD as far as epigastric pain though does report having more nausea over the past few weeks.  We discussed  resuming Reglan though at this time she would like to continue Zofran as needed, if she is not finding  that this is keeping her symptoms advise she should let me know.  IDA: Patient with recent onset of low ferritin in September 2024, was previously maintained on iron though stopped this prior to her upper endoscopy and feels that her epigastric pain may have been related to this as she has not had any further symptoms.  At this time would recommend rechecking iron levels to determine if she needs to resume her supplemental iron or if she can maintain off of this.  Will need to consider updating colonoscopy if she has persistent low iron levels given new onset IDA since last colonoscopy.  Constipation: well-managed with MiraLAX.  I did recommend she increase her water intake, aim for at least 64 ounces per day and increase fruits, veggies, whole grains in her diet with kiwi and prunes being especially helpful.  GERD: Well-managed on Dexilant 60 mg daily.  Denies any dysphagia odynophagia.  Recent EGD as outlined above.  PLAN:  -recheck iron levels ( wants to have these done with PCP) -will need to consider colonoscopy if iron is low -continue dexilant 60mg  daily  -continue zofran PRN  -Increase water intake, aim for atleast 64 oz per day -Increase fruits, veggies and whole grains, kiwi and prunes are especially good for constipation -4-5 small meals throughout the day  All questions were answered, patient verbalized understanding and is in agreement with plan as outlined above.   Follow Up: 6 months   Millicent Blazejewski L. Jeanmarie Hubert, MSN, APRN, AGNP-C Adult-Gerontology Nurse Practitioner [facility]

## 2023-03-02 NOTE — Patient Instructions (Addendum)
-  recheck iron levels (can have these done with your PCP and have them fax them over to [place]) -continue dexilant 60mg  daily  -continue zofran as needed for nausea, make sure you are eating smaller meals throughout the day -Increase water intake, aim for atleast 64 oz per day Increase fruits, veggies and whole grains, kiwi and prunes are especially good for constipation -can continue with miralax as you are doing for now  Follow up 6 months  6

## 2023-03-05 ENCOUNTER — Other Ambulatory Visit (INDEPENDENT_AMBULATORY_CARE_PROVIDER_SITE_OTHER): Payer: Self-pay | Admitting: Gastroenterology

## 2023-03-10 ENCOUNTER — Telehealth (INDEPENDENT_AMBULATORY_CARE_PROVIDER_SITE_OTHER): Payer: Self-pay | Admitting: Otolaryngology

## 2023-03-10 NOTE — Telephone Encounter (Signed)
 Reminder Call: Date: 03/11/2023 Status: Sch  Time: 1:50 PM [address] [place], [place] [address]  Confirmed time and location w/patient.

## 2023-03-11 ENCOUNTER — Encounter (INDEPENDENT_AMBULATORY_CARE_PROVIDER_SITE_OTHER): Payer: Self-pay

## 2023-03-11 ENCOUNTER — Ambulatory Visit (INDEPENDENT_AMBULATORY_CARE_PROVIDER_SITE_OTHER): Payer: Medicare PPO | Admitting: Otolaryngology

## 2023-03-11 VITALS — BP 147/87 | HR 70 | Ht 67.0 in | Wt 150.0 lb

## 2023-03-11 DIAGNOSIS — R0981 Nasal congestion: Secondary | ICD-10-CM | POA: Diagnosis not present

## 2023-03-11 DIAGNOSIS — J3489 Other specified disorders of nose and nasal sinuses: Secondary | ICD-10-CM

## 2023-03-11 DIAGNOSIS — H6123 Impacted cerumen, bilateral: Secondary | ICD-10-CM | POA: Diagnosis not present

## 2023-03-11 DIAGNOSIS — J342 Deviated nasal septum: Secondary | ICD-10-CM

## 2023-03-11 DIAGNOSIS — J310 Chronic rhinitis: Secondary | ICD-10-CM | POA: Diagnosis not present

## 2023-03-11 DIAGNOSIS — J343 Hypertrophy of nasal turbinates: Secondary | ICD-10-CM

## 2023-03-13 DIAGNOSIS — J3489 Other specified disorders of nose and nasal sinuses: Secondary | ICD-10-CM | POA: Insufficient documentation

## 2023-03-13 DIAGNOSIS — H6123 Impacted cerumen, bilateral: Secondary | ICD-10-CM | POA: Insufficient documentation

## 2023-03-13 DIAGNOSIS — J310 Chronic rhinitis: Secondary | ICD-10-CM | POA: Insufficient documentation

## 2023-03-13 DIAGNOSIS — J342 Deviated nasal septum: Secondary | ICD-10-CM | POA: Insufficient documentation

## 2023-03-13 DIAGNOSIS — J343 Hypertrophy of nasal turbinates: Secondary | ICD-10-CM | POA: Insufficient documentation

## 2023-03-13 NOTE — Progress Notes (Signed)
 Patient ID: Laurie Bridges, female   DOB: November 15, 1945, 78 y.o.   MRN: [id number]  Follow-up: Chronic nasal obstruction, recurrent sinusitis  HPI: The patient is a 78 year old female who returns today for her follow-up evaluation.  The patient was previously seen for chronic nasal obstruction and recurrent sinusitis.  She was previously noted to have nasal mucosal congestion, nasal septal deviation, and bilateral inferior turbinate hypertrophy.  She also has a large right concha bullosa and chronic postnasal drainage.  The patient was treated with Flonase and Atrovent nasal spray daily.  She returns today complaining of persistent nasal congestion.  She had recurrent sinusitis in November and December of 2024.  She was treated with multiple courses of antibiotics.  Currently she denies any facial pain, fever, or visual change.  Exam: General: Communicates without difficulty, well nourished, no acute distress. Head: Normocephalic, no evidence injury, no tenderness, facial buttresses intact without stepoff. Face/sinus: No tenderness to palpation and percussion. Facial movement is normal and symmetric. Eyes: PERRL, EOMI. No scleral icterus, conjunctivae clear. Neuro: CN II exam reveals vision grossly intact.  No nystagmus at any point of gaze. Ears: Auricles well formed without lesions.  Bilateral cerumen impaction.  Nose: External evaluation reveals normal support and skin without lesions.  Dorsum is intact.  Anterior rhinoscopy reveals congested mucosa over anterior aspect of inferior turbinates and deviated septum.  No purulence noted. Oral:  Oral cavity and oropharynx are intact, symmetric, without erythema or edema.  Mucosa is moist without lesions. Neck: Full range of motion without pain.  There is no significant lymphadenopathy.  No masses palpable.  Thyroid bed within normal limits to palpation.  Parotid glands and submandibular glands equal bilaterally without mass.  Trachea is midline. Neuro:  CN  2-12 grossly intact.    Procedure: Bilateral cerumen disimpaction Anesthesia: None Description: Under the operating microscope, the cerumen is carefully removed with a combination of cerumen currette, alligator forceps, and suction catheters.  After the cerumen is removed, the TMs are noted to be normal.  No mass, erythema, or lesions. The patient tolerated the procedure well.    Assessment: 1.  Chronic rhinitis with nasal mucosal congestion, nasal septal deviation, and bilateral inferior turbinate hypertrophy.  The patient also has a large right concha bullosa. 2.  Recurrent rhinosinusitis.  The patient was treated with multiple courses of antibiotics.  She has no acute infection today. 3.  Incidental finding of bilateral cerumen impaction.  After the disimpaction procedure, both tympanic membranes and middle ear spaces are noted to be normal.  Plan: 1.  The physical exam findings are reviewed with the patient. 2.  Otomicroscopy with bilateral cerumen disimpaction. 3.  Continue with Flonase nasal spray 2 sprays each nostril daily.  Atrovent nasal spray to treat the nasal drainage as needed. 4.  In light of her persistent nasal obstruction, she would likely benefit from surgical intervention with septoplasty, bilateral inferior turbinate reduction, and endoscopic excision of the right concha bullosa.  The risk, benefits, alternatives, and details of the procedures are extensively discussed.  Questions are invited and answered. 5.  The patient would like to consider her options.  She will return for reevaluation in 6 months.

## 2023-03-15 DIAGNOSIS — H524 Presbyopia: Secondary | ICD-10-CM | POA: Diagnosis not present

## 2023-03-16 DIAGNOSIS — M25561 Pain in right knee: Secondary | ICD-10-CM | POA: Diagnosis not present

## 2023-03-30 DIAGNOSIS — L308 Other specified dermatitis: Secondary | ICD-10-CM | POA: Diagnosis not present

## 2023-03-30 DIAGNOSIS — L301 Dyshidrosis [pompholyx]: Secondary | ICD-10-CM | POA: Diagnosis not present

## 2023-04-05 DIAGNOSIS — M1711 Unilateral primary osteoarthritis, right knee: Secondary | ICD-10-CM | POA: Diagnosis not present

## 2023-04-05 DIAGNOSIS — M25561 Pain in right knee: Secondary | ICD-10-CM | POA: Diagnosis not present

## 2023-04-08 ENCOUNTER — Telehealth: Payer: Self-pay

## 2023-04-08 NOTE — Progress Notes (Signed)
   04/08/2023  Patient ID: Laurie Bridges, female   DOB: 04/05/1945, 78 y.o.   MRN: [id number]   Patient appeared on insurance report for not passing the quality metrics in 2024:  Medication Adherence for Cholesterol (MAC)   Outreach to the patient was not needed today.  Meds Tracking:  -Rosuvastatin 40 mg - Last filled 90DS on 02/23/23, LDL 58 on 04/24/22. Does not qualify for metric yet, next fill due 05/24/23.  Plan:  Scheduled fill history review on 05/27/23 to check on rosuvastatin fill, will outreach if no fill.   Fayette Pho, PharmD

## 2023-04-15 DIAGNOSIS — L2089 Other atopic dermatitis: Secondary | ICD-10-CM | POA: Diagnosis not present

## 2023-04-15 DIAGNOSIS — L301 Dyshidrosis [pompholyx]: Secondary | ICD-10-CM | POA: Diagnosis not present

## 2023-04-20 ENCOUNTER — Other Ambulatory Visit (HOSPITAL_COMMUNITY): Payer: Self-pay

## 2023-04-20 ENCOUNTER — Telehealth: Payer: Self-pay | Admitting: Pharmacist

## 2023-04-20 ENCOUNTER — Encounter: Payer: Self-pay | Admitting: Pharmacist

## 2023-04-20 ENCOUNTER — Telehealth: Payer: Self-pay | Admitting: Pharmacy Technician

## 2023-04-20 ENCOUNTER — Ambulatory Visit: Payer: Medicare PPO | Attending: Cardiology | Admitting: Pharmacist

## 2023-04-20 DIAGNOSIS — E785 Hyperlipidemia, unspecified: Secondary | ICD-10-CM | POA: Diagnosis not present

## 2023-04-20 DIAGNOSIS — E782 Mixed hyperlipidemia: Secondary | ICD-10-CM

## 2023-04-20 MED ORDER — ROSUVASTATIN CALCIUM 20 MG PO TABS: 20.0000 mg | ORAL_TABLET | Freq: Every day | ORAL | 3 refills | Status: DC

## 2023-04-20 NOTE — Telephone Encounter (Signed)
 Pharmacy Patient Advocate Encounter  Received notification from [facility] that Prior Authorization for repatha has been APPROVED from 01/13/23 to 01/12/24. Ran test claim, Copay is $40.00. This test claim was processed through [facility]- copay amounts may vary at other pharmacies due to pharmacy/plan contracts, or as the patient moves through the different stages of their insurance plan.   PA #/Case ID/Reference #: [id number]

## 2023-04-20 NOTE — Patient Instructions (Signed)
 Your Results:             Your most recent labs Goal  Total Cholesterol 303 < 200  Triglycerides 178 < 150  HDL (happy/good cholesterol) 62 > 40  LDL (lousy/bad cholesterol 207 < 70   Medication changes: Reduce dose of Crestor from 40 mg daily to 20 mg daily if not tolerated reduce the dose further down to 10 mg daily or 10 mg every other day   We will start the process to get PCSK9i( Repatha or Praluent)  covered by your insurance.  Once the prior authorization is complete, we will call you to let you know and confirm pharmacy information.   Praluent is a cholesterol medication that improved your body's ability to get rid of "bad cholesterol" known as LDL. It can lower your LDL up to 60%. It is an injection that is given under the skin every 2 weeks. The most common side effects of Praluent include runny nose, symptoms of the common cold, rarely flu or flu-like symptoms, back/muscle pain in about 3-4% of the patients, and redness, pain, or bruising at the injection site.    Repatha is a cholesterol medication that improved your body's ability to get rid of "bad cholesterol" known as LDL. It can lower your LDL up to 60%! It is an injection that is given under the skin every 2 weeks. The medication often requires a prior authorization from your insurance company. The most common side effects of Repatha include runny nose, symptoms of the common cold, rarely flu or flu-like symptoms, back/muscle pain in about 3-4% of the patients, and redness, pain, or bruising at the injection site.   Lab orders: We want to repeat labs after 2-3 months.  We will send you a lab order to remind you once we get closer to that time.

## 2023-04-20 NOTE — Progress Notes (Signed)
 Patient ID: Laurie Bridges                 DOB: [birth year]                    MRN: [id number]      HPI: Laurie Bridges is a 78 y.o. female patient referred to lipid clinic by Dr.Ross. PMH is significant for CAD (s/p coronary CCTA in June 2020 showing mild narrowing in mid,LAD, mid LCx and mid RCA), HTN,  GERD.  Due to statin intolerance patient had stopped taking statin patient was referred to the lipid clinic.  Patient presented for lipid clinic with her sister in law. Reports she went off of Crestor 40 mg due to joint pain. Within few weeks her pain went away but when they did lipid test 2 months ago LDLc level was significantly elevated so she started it back she is willing to try lower than current dose and if she need to stay on statin she will as long as pain is manageable. She also tried atorvastatin in the past due to muscle/joint pain switched to Crestor    Reviewed options for lowering LDL cholesterol, including ezetimibe, PCSK-9 inhibitors, bempedoic acid and inclisiran.  Discussed mechanisms of action, dosing, side effects and potential decreases in LDL cholesterol.  Also reviewed cost information and potential options for patient assistance.   She eats fairly well balanced diet. Eats out twice a week and likes sweets. She can't do much exercise. Post hip replacement one of her groin muscle gets pulled every time when she go for walk. Walking from parking lot to the office can be challenging sometime. She was stationary bike but due to joint pain from Crestor she avoids using the bike.  Current Medications: Crestor 40 mg  daily  Intolerances: Crestor  and Lipitor 40 and 20 mg daily - joints pain  Risk Factors: CAD (s/p coronary CCTA in June 2020 showing mild narrowing in mid,LAD, mid LCx and mid RCA), HTN LDL goal: <70  Last lipid lab LDL 207 and >2200 LDL particle numbers, HDL 62, TG 178, TC 303   Lab 1 yr ago LDLc was 69 TG 121 and TC 159 when on Crestor 40 mg daily   Diet:  fairly heathy  even when she eats out twice a week.  She likes to eat sweets   Exercise:limited due to chronic hip pain and joint pain   Family History:  Relation Problem Comments  Mother (Deceased) Stroke     Father Metallurgist) Neuropathy   Stroke     Maternal Uncle (Deceased) Cancer     Paternal Uncle (Deceased) Cancer     Paternal Uncle (Deceased) Cancer     Child - 3 (Alive) Neuropathy       Labs:  Lipid Panel     Component Value Date/Time   CHOL 139 10/26/2019 1511   TRIG 97 10/26/2019 1511   HDL 61 10/26/2019 1511   CHOLHDL 2.3 10/26/2019 1511   LDLCALC 60 10/26/2019 1511    Past Medical History:  Diagnosis Date   Anemia    hx of Fe def many years ago   Anxiety    Arthritis    back   CAD (coronary artery disease)    a. s/p Coronary CT in 06/2018 showing mild stenosis along the mid-LAD, mid-LCx and mid-RCA   GERD (gastroesophageal reflux disease)    Hypertension    PONV (postoperative nausea and vomiting)    Pre-diabetes  Current Outpatient Medications on File Prior to Visit  Medication Sig Dispense Refill   aspirin EC 81 MG tablet Take 1 tablet (81 mg total) by mouth at bedtime.     CINNAMON PO Take by mouth. 2,000 daily     dexlansoprazole (DEXILANT) 60 MG capsule TAKE (1) CAPSULE BY MOUTH ONCE DAILY. 30 capsule 11   fluticasone (FLONASE) 50 MCG/ACT nasal spray Place 2 sprays into both nostrils daily.     GLUCOSAMINE-CALCIUM-VIT D PO Take by mouth daily.     guaiFENesin (MUCINEX PO) Take by mouth as needed. Takes as needed. (Patient not taking: Reported on 03/11/2023)     LORazepam (ATIVAN) 1 MG tablet Take 1 mg by mouth at bedtime.     Multiple Vitamin (MULTIVITAMIN) tablet Take 1 tablet by mouth daily.     ondansetron (ZOFRAN) 4 MG tablet TAKE ONE TABLET BY MOUTH EVERY 8 HOURS AS NEEDED FOR NAUSEA OR VOMITING 30 tablet 1   Polyethyl Glycol-Propyl Glycol (SYSTANE OP) Apply to eye. One drop each eye twice daily     Polyethylene Glycol 3350 (MIRALAX  PO) Take by mouth as needed.     sertraline (ZOLOFT) 25 MG tablet Take 25 mg by mouth daily.     No current facility-administered medications on file prior to visit.    Allergies  Allergen Reactions   Codeine Other (See Comments)    Syncope    Demerol Rash   Elemental Sulfur Rash   Feldene [Piroxicam] Rash   Latex Rash   Pyridium [Phenazopyridine Hcl] Itching and Rash    Assessment/Plan:  1. Hyperlipidemia -  Problem  Hyperlipidemia Ldl Goal <70   Current Medications: Crestor 20 mg  or 10 daily (max tolerated dose - patient advised to self adjust the dose until find max tolerated dose)  Intolerances: Crestor  and Lipitor 40 and 20 mg daily - myalgia  Risk Factors: CAD (s/p coronary CCTA in June 2020 showing mild narrowing in mid,LAD, mid LCx and mid RCA), HTN LDL goal: <70  Last lipid lab LDL 207 and >2200 LDL particle numbers, HDL 62, TG 178, TC 303   Lab 1 yr ago LDLc was 69 TG 121 and TC 159 when on Crestor 40 mg daily     Hyperlipidemia LDL goal <70 Assessment:  LDL goal: < 70 mg/dl TG <578 mg/dl last LDL 469 and >6295 LDL particle numbers, HDL 62, TG 178, TC 303 while off of statin  LDL was well controlled 69 TG 121 when on Crestor 40 mg daily  Intolerance to statins - Crestor 40 mg daily  and Lipitor 40 and 20 mg daily - joints pain  Discussed next potential options (PCSK-9 inhibitors, bempedoic acid and inclisiran); cost, dosing efficacy, side effects  Chronic pain limits exercise capacity and follows fairly healthy diet   Plan: Reduce dose of Crestor from 40 mg daily to 20 mg daily if not tolerated reduce the dose further down to 10 mg daily or 10 mg every other day  Will apply for PA for PCSK9i; will inform patient upon approved Lipid lab due in 2-3 months after starting PCSK9i    Thank you,  Carmela Hurt, Pharm.D [facility] of [facility] [address] [place], [place] [address]  Phone: [phone]; Fax: [phone]

## 2023-04-20 NOTE — Assessment & Plan Note (Signed)
 Assessment:  LDL goal: < 70 mg/dl TG <875 mg/dl last LDL 643 and >3295 LDL particle numbers, HDL 62, TG 178, TC 303 while off of statin  LDL was well controlled 69 TG 121 when on Crestor 40 mg daily  Intolerance to statins - Crestor 40 mg daily  and Lipitor 40 and 20 mg daily - joints pain  Discussed next potential options (PCSK-9 inhibitors, bempedoic acid and inclisiran); cost, dosing efficacy, side effects  Chronic pain limits exercise capacity and follows fairly healthy diet   Plan: Reduce dose of Crestor from 40 mg daily to 20 mg daily if not tolerated reduce the dose further down to 10 mg daily or 10 mg every other day  Will apply for PA for PCSK9i; will inform patient upon approved Lipid lab due in 2-3 months after starting [facility]

## 2023-04-20 NOTE — Telephone Encounter (Signed)
 Pharmacy Patient Advocate Encounter   Received notification from Pt Calls Messages that prior authorization for REPATHA is required/requested.   Insurance verification completed.   The patient is insured through [employer] .   Per test claim: PA required; PA submitted to above mentioned insurance via CoverMyMeds Key/confirmation #/[id number] Status is pending

## 2023-04-22 MED ORDER — REPATHA SURECLICK 140 MG/ML ~~LOC~~ SOAJ: 140.0000 mg | SUBCUTANEOUS | 3 refills | Status: AC

## 2023-04-22 NOTE — Addendum Note (Signed)
 Addended by: Tylene Fantasia on: 04/22/2023 01:26 PM   Modules accepted: Orders

## 2023-04-22 NOTE — Telephone Encounter (Signed)
 Pt informed about approval. She will call us back in few weeks once she find out what dose of Crestor she is able to tolerate. And she will start taking Repatha 140 mg Q14D. F/u lab due July 05, 2023

## 2023-04-23 NOTE — Telephone Encounter (Signed)
 PA approved see other encounter for more info

## 2023-04-26 DIAGNOSIS — I700 Atherosclerosis of aorta: Secondary | ICD-10-CM | POA: Diagnosis not present

## 2023-04-26 DIAGNOSIS — F419 Anxiety disorder, unspecified: Secondary | ICD-10-CM | POA: Diagnosis not present

## 2023-04-26 DIAGNOSIS — K219 Gastro-esophageal reflux disease without esophagitis: Secondary | ICD-10-CM | POA: Diagnosis not present

## 2023-04-26 DIAGNOSIS — Z79899 Other long term (current) drug therapy: Secondary | ICD-10-CM | POA: Diagnosis not present

## 2023-04-26 DIAGNOSIS — E785 Hyperlipidemia, unspecified: Secondary | ICD-10-CM | POA: Diagnosis not present

## 2023-04-26 DIAGNOSIS — E616 Vanadium deficiency: Secondary | ICD-10-CM | POA: Diagnosis not present

## 2023-04-26 DIAGNOSIS — R202 Paresthesia of skin: Secondary | ICD-10-CM | POA: Diagnosis not present

## 2023-04-26 DIAGNOSIS — I2510 Atherosclerotic heart disease of native coronary artery without angina pectoris: Secondary | ICD-10-CM | POA: Diagnosis not present

## 2023-05-03 DIAGNOSIS — N183 Chronic kidney disease, stage 3 unspecified: Secondary | ICD-10-CM | POA: Diagnosis not present

## 2023-05-03 DIAGNOSIS — Z0001 Encounter for general adult medical examination with abnormal findings: Secondary | ICD-10-CM | POA: Diagnosis not present

## 2023-05-03 DIAGNOSIS — K219 Gastro-esophageal reflux disease without esophagitis: Secondary | ICD-10-CM | POA: Diagnosis not present

## 2023-05-03 DIAGNOSIS — I2510 Atherosclerotic heart disease of native coronary artery without angina pectoris: Secondary | ICD-10-CM | POA: Diagnosis not present

## 2023-05-03 DIAGNOSIS — I10 Essential (primary) hypertension: Secondary | ICD-10-CM | POA: Diagnosis not present

## 2023-05-19 DIAGNOSIS — L308 Other specified dermatitis: Secondary | ICD-10-CM | POA: Diagnosis not present

## 2023-05-19 DIAGNOSIS — L820 Inflamed seborrheic keratosis: Secondary | ICD-10-CM | POA: Diagnosis not present

## 2023-05-19 DIAGNOSIS — B353 Tinea pedis: Secondary | ICD-10-CM | POA: Diagnosis not present

## 2023-05-27 ENCOUNTER — Telehealth: Payer: Self-pay

## 2023-05-27 NOTE — Progress Notes (Signed)
   05/27/2023  Patient ID: Laurie Bridges, female   DOB: 27-Jan-1945, 78 y.o.   MRN: [id number]   Adherence Monitoring  Reviewed lipid clinic notes and PCP notes, well documented statin intolerance, if able to tolerate rosuvastatin  will need 1 additional 90DS to meet PCD of 80% for the year. Will review lipid clinic notes and follow up with patient in July to determine maximally tolerated dose. May need new prescription reflecting this maximally tolerated dose to prevent MAC failure in 2025.    Flint Hummer, PharmD

## 2023-06-01 DIAGNOSIS — M791 Myalgia, unspecified site: Secondary | ICD-10-CM | POA: Diagnosis not present

## 2023-06-01 DIAGNOSIS — M25552 Pain in left hip: Secondary | ICD-10-CM | POA: Diagnosis not present

## 2023-06-02 ENCOUNTER — Other Ambulatory Visit (HOSPITAL_COMMUNITY): Payer: Self-pay | Admitting: Orthopedic Surgery

## 2023-06-02 DIAGNOSIS — T84031A Mechanical loosening of internal left hip prosthetic joint, initial encounter: Secondary | ICD-10-CM

## 2023-06-02 DIAGNOSIS — M1612 Unilateral primary osteoarthritis, left hip: Secondary | ICD-10-CM | POA: Diagnosis not present

## 2023-06-10 ENCOUNTER — Ambulatory Visit (HOSPITAL_COMMUNITY)
Admission: RE | Admit: 2023-06-10 | Discharge: 2023-06-10 | Disposition: A | Discharge status: Home / Self Care | Source: Ambulatory Visit | Attending: Orthopedic Surgery | Admitting: Orthopedic Surgery

## 2023-06-10 ENCOUNTER — Encounter (HOSPITAL_COMMUNITY)
Admission: RE | Admit: 2023-06-10 | Discharge: 2023-06-10 | Disposition: A | Discharge status: Home / Self Care | Source: Ambulatory Visit | Attending: Orthopedic Surgery | Admitting: Orthopedic Surgery

## 2023-06-10 DIAGNOSIS — M25552 Pain in left hip: Secondary | ICD-10-CM | POA: Diagnosis not present

## 2023-06-10 DIAGNOSIS — T84031A Mechanical loosening of internal left hip prosthetic joint, initial encounter: Secondary | ICD-10-CM | POA: Insufficient documentation

## 2023-06-10 DIAGNOSIS — Z96642 Presence of left artificial hip joint: Secondary | ICD-10-CM | POA: Diagnosis not present

## 2023-06-10 DIAGNOSIS — G8929 Other chronic pain: Secondary | ICD-10-CM | POA: Diagnosis not present

## 2023-06-10 MED ORDER — TECHNETIUM TC 99M MEDRONATE IV KIT
20.0000 | PACK | Freq: Once | INTRAVENOUS | Status: AC | PRN
  Administered 2023-06-10: 21.7 via INTRAVENOUS

## 2023-06-14 DIAGNOSIS — M1612 Unilateral primary osteoarthritis, left hip: Secondary | ICD-10-CM | POA: Diagnosis not present

## 2023-06-15 DIAGNOSIS — M545 Low back pain, unspecified: Secondary | ICD-10-CM | POA: Diagnosis not present

## 2023-06-15 DIAGNOSIS — M5416 Radiculopathy, lumbar region: Secondary | ICD-10-CM | POA: Diagnosis not present

## 2023-06-15 DIAGNOSIS — M1612 Unilateral primary osteoarthritis, left hip: Secondary | ICD-10-CM | POA: Diagnosis not present

## 2023-06-22 DIAGNOSIS — M25552 Pain in left hip: Secondary | ICD-10-CM | POA: Diagnosis not present

## 2023-06-22 DIAGNOSIS — M1612 Unilateral primary osteoarthritis, left hip: Secondary | ICD-10-CM | POA: Diagnosis not present

## 2023-06-22 DIAGNOSIS — Z96642 Presence of left artificial hip joint: Secondary | ICD-10-CM | POA: Diagnosis not present

## 2023-06-29 DIAGNOSIS — M1612 Unilateral primary osteoarthritis, left hip: Secondary | ICD-10-CM | POA: Diagnosis not present

## 2023-06-29 DIAGNOSIS — M898X5 Other specified disorders of bone, thigh: Secondary | ICD-10-CM | POA: Diagnosis not present

## 2023-06-30 ENCOUNTER — Telehealth: Payer: Self-pay | Admitting: Internal Medicine

## 2023-06-30 ENCOUNTER — Telehealth: Payer: Self-pay | Admitting: *Deleted

## 2023-06-30 NOTE — Telephone Encounter (Signed)
 Pt has been scheduled tele preop appt 07/07/23. Med rec and consent are done. Pt states surgeon waiting on clearance before scheduling surgery.     Patient Consent for Virtual Visit        Laurie Bridges has provided verbal consent on 06/30/2023 for a virtual visit (video or telephone).   CONSENT FOR VIRTUAL VISIT FOR:  Laurie Bridges  By participating in this virtual visit I agree to the following:  I hereby voluntarily request, consent and authorize [facility] and its employed or contracted physicians, physician assistants, nurse practitioners or other licensed health care professionals (the Practitioner), to provide me with telemedicine health care services (the "Services) as deemed necessary by the treating Practitioner. I acknowledge and consent to receive the Services by the Practitioner via telemedicine. I understand that the telemedicine visit will involve communicating with the Practitioner through live audiovisual communication technology and the disclosure of certain medical information by electronic transmission. I acknowledge that I have been given the opportunity to request an in-person assessment or other available alternative prior to the telemedicine visit and am voluntarily participating in the telemedicine visit.  I understand that I have the right to withhold or withdraw my consent to the use of telemedicine in the course of my care at any time, without affecting my right to future care or treatment, and that the Practitioner or I may terminate the telemedicine visit at any time. I understand that I have the right to inspect all information obtained and/or recorded in the course of the telemedicine visit and may receive copies of available information for a reasonable fee.  I understand that some of the potential risks of receiving the Services via telemedicine include:  Delay or interruption in medical evaluation due to technological equipment failure or  disruption; Information transmitted may not be sufficient (e.g. poor resolution of images) to allow for appropriate medical decision making by the Practitioner; and/or  In rare instances, security protocols could fail, causing a breach of personal health information.  Furthermore, I acknowledge that it is my responsibility to provide information about my medical history, conditions and care that is complete and accurate to the best of my ability. I acknowledge that Practitioner's advice, recommendations, and/or decision may be based on factors not within their control, such as incomplete or inaccurate data provided by me or distortions of diagnostic images or specimens that may result from electronic transmissions. I understand that the practice of medicine is not an exact science and that Practitioner makes no warranties or guarantees regarding treatment outcomes. I acknowledge that a copy of this consent can be made available to me via my patient portal [facility] MyChart), or I can request a printed copy by calling the office of [facility].    I understand that my insurance will be billed for this visit.   I have read or had this consent read to me. I understand the contents of this consent, which adequately explains the benefits and risks of the Services being provided via telemedicine.  I have been provided ample opportunity to ask questions regarding this consent and the Services and have had my questions answered to my satisfaction. I give my informed consent for the services to be provided through the use of telemedicine in my medical care

## 2023-06-30 NOTE — Telephone Encounter (Signed)
 Patient called to follow-up on paperwork faxed from [facility].

## 2023-06-30 NOTE — Telephone Encounter (Signed)
   Pre-operative Risk Assessment    Patient Name: Laurie Bridges  DOB: 06/20/1945 MRN: [id number]   Date of last office visit: 03/08/23 Dr Avanell Bob Date of next office visit: NONE   Request for Surgical Clearance    Procedure:  REVISION LT HIP ARTHROPLASTY- STEM ONLY  Date of Surgery:  Clearance TBD                                Surgeon:  Priscille Brought, MD Surgeon's Group or Practice Name:  [facility] Phone number:  [phone] Fax number:  [phone]   Type of Clearance Requested:   - Medical  - Pharmacy:  Hold Aspirin  NOT INDICATED   Type of Anesthesia:  Not Indicated   Additional requests/questions:    Signed, Nysha Koplin   06/30/2023, 11:42 AM

## 2023-06-30 NOTE — Telephone Encounter (Signed)
 Pt has been scheduled tele preop appt 07/07/23. Med rec and consent are done. Pt states surgeon waiting on clearance before scheduling surgery.

## 2023-06-30 NOTE — Telephone Encounter (Signed)
   Name: Laurie Bridges  DOB: 03-04-1945  MRN: [id number]  Primary Cardiologist: None   Preoperative team, please contact this patient and set up a phone call appointment for further preoperative risk assessment. Please obtain consent and complete medication review. Thank you for your help.  I confirm that guidance regarding antiplatelet and oral anticoagulation therapy has been completed and, if necessary, noted below.  Ideally aspirin  should be continued without interruption, however if the bleeding risk is too great, aspirin  may be held for 5-7 days prior to surgery. Please resume aspirin  post operatively when it is felt to be safe from a bleeding standpoint.    I also confirmed the patient resides in the state of [place] . As per [facility] Medical Board telemedicine laws, the patient must reside in the state in which the provider is licensed.    Morey Ar, NP 06/30/2023, 12:04 PM [facility]

## 2023-07-07 ENCOUNTER — Ambulatory Visit: Attending: Cardiology

## 2023-07-07 DIAGNOSIS — Z01810 Encounter for preprocedural cardiovascular examination: Secondary | ICD-10-CM | POA: Diagnosis not present

## 2023-07-07 DIAGNOSIS — M79652 Pain in left thigh: Secondary | ICD-10-CM | POA: Diagnosis not present

## 2023-07-07 NOTE — Progress Notes (Signed)
 Virtual Visit via Telephone Note   Because of Laurie Bridges co-morbid illnesses, she is at least at moderate risk for complications without adequate follow up.  This format is felt to be most appropriate for this patient at this time.  Due to technical limitations with video connection (technology), today's appointment will be conducted as an audio only telehealth visit, and JOSELYN Bridges verbally agreed to proceed in this manner.   All issues noted in this document were discussed and addressed.  No physical exam could be performed with this format.  Evaluation Performed:  Preoperative cardiovascular risk assessment _____________   Date:  07/07/2023   Patient ID:  Laurie Bridges, DOB 06-19-1945, MRN [id number] Patient Location:  Home Provider location:   Office  Primary Care Provider:  Sheryle Carwin, MD Primary Cardiologist:  None  Chief Complaint / Patient Profile  78 y.o. y/o female with a h/o CAD s/p coronary CTA in June 2020 showing mild narrowing in mid LAD, mid LCx and mid RCA, hypertension, COPD, hyperlipidemia who is pending revision of left hip arthroplasty and presents today for telephonic preoperative cardiovascular risk assessment. History of Present Illness  Laurie Bridges is a 78 y.o. female who presents via audio/video conferencing for a telehealth visit today.  Pt was last seen in cardiology clinic on 02/16/2023 by Dr. Okey.  At that time SHANDELL GIOVANNI was doing well.  The patient is now pending procedure as outlined above. Since her last visit, she has remained stable from a cardiac standpoint. She is able to achieve greater than 4 METs of activity. Today she denies chest pain, shortness of breath, lower extremity edema, fatigue, palpitations, melena, hematuria, hemoptysis, diaphoresis, weakness, presyncope, syncope, orthopnea, and PND.  Past Medical History    Past Medical History:  Diagnosis Date   Anemia    hx of Fe def many years ago    Anxiety    Arthritis    back   CAD (coronary artery disease)    a. s/p Coronary CT in 06/2018 showing mild stenosis along the mid-LAD, mid-LCx and mid-RCA   GERD (gastroesophageal reflux disease)    Hypertension    PONV (postoperative nausea and vomiting)    Pre-diabetes    Past Surgical History:  Procedure Laterality Date   ABDOMINAL HYSTERECTOMY     partial hysterectomy, ovaries remain   BACK SURGERY     BIOPSY  12/29/2022   Procedure: BIOPSY;  Surgeon: Cinderella Deatrice FALCON, MD;  [facility];  Service: Endoscopy;;   CATARACT EXTRACTION W/PHACO Right 01/15/2023   Procedure: CATARACT EXTRACTION PHACO AND INTRAOCULAR LENS PLACEMENT (IOC);  Surgeon: Harrie Agent, MD;  Location: [facility];  Service: Ophthalmology;  Laterality: Right;  CDE 6.32   CATARACT EXTRACTION W/PHACO Left 01/29/2023   Procedure: CATARACT EXTRACTION PHACO AND INTRAOCULAR LENS PLACEMENT (IOC);  Surgeon: Harrie Agent, MD;  Location: [facility];  Service: Ophthalmology;  Laterality: Left;  CDE: 7.39   CHOLECYSTECTOMY N/A 11/27/2019   Procedure: LAPAROSCOPIC CHOLECYSTECTOMY;  Surgeon: Kallie Manuelita BROCKS, MD;  Location: [facility];  Service: General;  Laterality: N/A;   COLONOSCOPY N/A 02/29/2020   Procedure: COLONOSCOPY;  Surgeon: Golda Claudis PENNER, MD;  [facility];  Service: Endoscopy;  Laterality: N/A;  8:45   ESOPHAGEAL DILATION N/A 11/17/2017   Procedure: ESOPHAGEAL DILATION;  Surgeon: Golda Claudis PENNER, MD;  [facility];  Service: Endoscopy;  Laterality: N/A;   ESOPHAGOGASTRODUODENOSCOPY N/A 11/17/2017   Procedure: ESOPHAGOGASTRODUODENOSCOPY (EGD);  Surgeon: Golda Claudis  U, MD;  [facility];  Service: Endoscopy;  Laterality: N/A;  12:00   ESOPHAGOGASTRODUODENOSCOPY N/A 11/02/2019   Procedure: ESOPHAGOGASTRODUODENOSCOPY (EGD);  Surgeon: Golda Claudis PENNER, MD;  [facility];  Service: Endoscopy;  Laterality: N/A;  945   ESOPHAGOGASTRODUODENOSCOPY (EGD) WITH PROPOFOL  N/A  12/29/2022   Procedure: ESOPHAGOGASTRODUODENOSCOPY (EGD) WITH PROPOFOL ;  Surgeon: Cinderella Deatrice FALCON, MD;  [facility];  Service: Endoscopy;  Laterality: N/A;  11:15AM;ASA 3   FOOT SURGERY     POLYPECTOMY  02/29/2020   Procedure: POLYPECTOMY;  Surgeon: Golda Claudis PENNER, MD;  [facility];  Service: Endoscopy;;   REVISION TOTAL HIP ARTHROPLASTY Left    Allergies Allergies  Allergen Reactions   Codeine Other (See Comments)    Syncope    Demerol  Rash   Elemental Sulfur  Rash   Feldene [Piroxicam] Rash   Latex Rash   Pyridium [Phenazopyridine Hcl] Itching and Rash   Home Medications    Prior to Admission medications   Medication Sig Start Date End Date Taking? Authorizing Provider  aspirin  EC 81 MG tablet Take 1 tablet (81 mg total) by mouth at bedtime. 03/01/20   Rehman, Claudis PENNER, MD  CINNAMON PO Take by mouth. 2,000 daily    [provider]  dexlansoprazole  (DEXILANT ) 60 MG capsule TAKE (1) CAPSULE BY MOUTH ONCE DAILY. 02/24/23   Carlan, Chelsea L, NP  Evolocumab  (REPATHA  SURECLICK) 140 MG/ML SOAJ Inject 140 mg into the skin every 14 (fourteen) days. 04/22/23   Ross, Paula V, MD  fluticasone [facility]) 50 MCG/ACT nasal spray Place 2 sprays into both nostrils daily.    [provider]  GLUCOSAMINE-CALCIUM -VIT D PO Take by mouth daily.    [provider]  guaiFENesin (MUCINEX PO) Take by mouth as needed. Takes as needed.    [provider]  LORazepam (ATIVAN) 1 MG tablet Take 1 mg by mouth at bedtime. 02/03/22   [provider]  Multiple Vitamin (MULTIVITAMIN) tablet Take 1 tablet by mouth daily.    [provider]  ondansetron  (ZOFRAN ) 4 MG tablet TAKE ONE TABLET BY MOUTH EVERY 8 HOURS AS NEEDED FOR NAUSEA OR VOMITING 03/05/23   Carlan, Chelsea L, NP  Polyethyl Glycol-Propyl Glycol (SYSTANE OP) Apply to eye. One drop each eye twice daily    [provider]  Polyethylene Glycol 3350  (MIRALAX PO) Take by mouth  as needed.    [provider]  rosuvastatin  (CRESTOR ) 20 MG tablet Take 1 tablet (20 mg total) by mouth daily. 04/20/23 07/19/23  Okey Vina GAILS, MD  sertraline (ZOLOFT) 25 MG tablet Take 25 mg by mouth daily.    [provider]   Physical Exam  Vital Signs:  AHRIANNA SIGLIN does not have vital signs available for review today. Given telephonic nature of communication, physical exam is limited. AAOx3. NAD. Normal affect.  Speech and respirations are unlabored. Accessory Clinical Findings  None Assessment & Plan    1.  Preoperative Cardiovascular Risk Assessment: Ms. Braver perioperative risk of a major cardiac event is 0.4% according to the Revised Cardiac Risk Index (RCRI).  Therefore, she is at low risk for perioperative complications.   Her functional capacity is good at 6.05 METs according to the Duke Activity Status Index (DASI). Recommendations: According to ACC/AHA guidelines, no further cardiovascular testing needed.  The patient may proceed to surgery at acceptable risk.   Antiplatelet and/or Anticoagulation Recommendations: Ideally aspirin  should be continued without interruption, however if the bleeding risk is  too great, aspirin  may be held for 5-7 days prior to surgery. Please resume aspirin  post operatively when it is felt to be safe from a bleeding standpoint.    The patient was advised that if she develops new symptoms prior to surgery to contact our office to arrange for a follow-up visit, and she verbalized understanding.  A copy of this note will be routed to requesting surgeon.  Time:   Today, I have spent 10 minutes with the patient with telehealth technology discussing medical history, symptoms, and management plan.    Ashaun Gaughan D Jeremie Abdelaziz, NP  07/07/2023, 5:27 PM

## 2023-07-08 ENCOUNTER — Telehealth: Payer: Self-pay | Admitting: Pharmacist

## 2023-07-08 NOTE — Telephone Encounter (Signed)
 Lipid lab is due. MyChart message sent to the patient.

## 2023-07-12 DIAGNOSIS — E782 Mixed hyperlipidemia: Secondary | ICD-10-CM | POA: Diagnosis not present

## 2023-07-13 LAB — NMR, LIPOPROFILE
Cholesterol, Total: 135 mg/dL (ref 100–199)
HDL Particle Number: 38.8 umol/L (ref >=30.5)
HDL-C: 72 mg/dL (ref >39)
LDL Particle Number: 405 nmol/L (ref <1000)
LDL Size: 20 nm — ABNORMAL LOW (ref >20.5)
LDL-C (NIH Calc): 46 mg/dL (ref 0–99)
LP-IR Score: <25 (ref <=45)
Small LDL Particle Number: 235 nmol/L (ref <=527)
Triglycerides: 94 mg/dL (ref 0–149)

## 2023-07-14 ENCOUNTER — Ambulatory Visit: Payer: Self-pay | Admitting: Pharmacist

## 2023-07-14 MED ORDER — ROSUVASTATIN CALCIUM 20 MG PO TABS: ORAL_TABLET | ORAL | 3 refills | Status: AC

## 2023-07-14 NOTE — Telephone Encounter (Signed)
 Lipid results were discussed with the patient over the phone. He reported that he has been taking Crestor  20 mg three times per week for the past 2-3 months and Repatha  140 mg every 14 days. He is tolerating the current regimen well. Advised the patient to continue with the current treatment, and the statin dosing schedule was updated in the chart accordingly.

## 2023-07-21 DIAGNOSIS — L821 Other seborrheic keratosis: Secondary | ICD-10-CM | POA: Diagnosis not present

## 2023-07-21 DIAGNOSIS — D2262 Melanocytic nevi of left upper limb, including shoulder: Secondary | ICD-10-CM | POA: Diagnosis not present

## 2023-07-21 DIAGNOSIS — L218 Other seborrheic dermatitis: Secondary | ICD-10-CM | POA: Diagnosis not present

## 2023-07-21 DIAGNOSIS — L814 Other melanin hyperpigmentation: Secondary | ICD-10-CM | POA: Diagnosis not present

## 2023-07-21 DIAGNOSIS — L858 Other specified epidermal thickening: Secondary | ICD-10-CM | POA: Diagnosis not present

## 2023-07-21 DIAGNOSIS — L570 Actinic keratosis: Secondary | ICD-10-CM | POA: Diagnosis not present

## 2023-07-21 DIAGNOSIS — Z85828 Personal history of other malignant neoplasm of skin: Secondary | ICD-10-CM | POA: Diagnosis not present

## 2023-07-21 DIAGNOSIS — B353 Tinea pedis: Secondary | ICD-10-CM | POA: Diagnosis not present

## 2023-07-22 ENCOUNTER — Ambulatory Visit: Payer: Self-pay | Admitting: Emergency Medicine

## 2023-07-22 DIAGNOSIS — M79652 Pain in left thigh: Secondary | ICD-10-CM | POA: Diagnosis not present

## 2023-07-22 DIAGNOSIS — G8929 Other chronic pain: Secondary | ICD-10-CM

## 2023-07-22 NOTE — H&P (View-Only) (Signed)
 TOTAL HIP REVISION ADMISSION H&P  Patient is admitted for left revision total hip arthroplasty.  Subjective:  Chief Complaint: left hip pain  HPI: Laurie Bridges, 78 y.o. female, has a history of pain and functional disability in the left hip due to aseptic loosening and patient has failed non-surgical conservative treatments for greater than 12 weeks to include NSAID's and/or analgesics, use of assistive devices, and activity modification. The indications for the revision total hip arthroplasty are loosening of one or more components.  Onset of symptoms was gradual starting 2-3 years ago since the original hip replacement while gradually worsening course since that time.  Prior procedures on the left hip include arthroplasty.  Patient currently rates pain in the left hip at 10 out of 10 with activity.  There is night pain, worsening of pain with activity and weight bearing, pain that interfers with activities of daily living, and pain with passive range of motion. Patient has evidence of prosthetic loosening of the femoral component and subsidence in the femur by imaging studies.  This condition presents safety issues increasing the risk of falls.   There is no current active infection.  Patient Active Problem List   Diagnosis Date Noted   Hyperlipidemia LDL goal <70 04/20/2023   Impacted cerumen of both ears 03/13/2023   Chronic rhinitis 03/13/2023   Deviated nasal septum 03/13/2023   Hypertrophy of nasal turbinates 03/13/2023   Concha bullosa 03/13/2023   Constipation 03/02/2023   Gastritis and gastroduodenitis 12/29/2022   Abdominal pain, epigastric 12/17/2022   Nausea without vomiting 12/17/2022   Low ferritin level 12/17/2022   Gastroparesis 02/06/2021   Postprandial bloating 11/21/2020   Early satiety 11/21/2020   Gallstones 11/17/2018   Gastroesophageal reflux disease without esophagitis 09/14/2017   Esophageal dysphagia 09/14/2017   Dyspnea 08/02/2013   GERD  (gastroesophageal reflux disease) 08/02/2013   Past Medical History:  Diagnosis Date   Anemia    hx of Fe def many years ago   Anxiety    Arthritis    back   CAD (coronary artery disease)    a. s/p Coronary CT in 06/2018 showing mild stenosis along the mid-LAD, mid-LCx and mid-RCA   GERD (gastroesophageal reflux disease)    Hypertension    PONV (postoperative nausea and vomiting)    Pre-diabetes     Past Surgical History:  Procedure Laterality Date   ABDOMINAL HYSTERECTOMY     partial hysterectomy, ovaries remain   BACK SURGERY     BIOPSY  12/29/2022   Procedure: BIOPSY;  Surgeon: Cinderella Deatrice FALCON, MD;  [facility]: [facility];  Service: Endoscopy;;   CATARACT EXTRACTION W/PHACO Right 01/15/2023   Procedure: CATARACT EXTRACTION PHACO AND INTRAOCULAR LENS PLACEMENT (IOC);  Surgeon: Harrie Agent, MD;  Location: [facility];  Service: Ophthalmology;  Laterality: Right;  CDE 6.32   CATARACT EXTRACTION W/PHACO Left 01/29/2023   Procedure: CATARACT EXTRACTION PHACO AND INTRAOCULAR LENS PLACEMENT (IOC);  Surgeon: Harrie Agent, MD;  Location: [facility];  Service: Ophthalmology;  Laterality: Left;  CDE: 7.39   CHOLECYSTECTOMY N/A 11/27/2019   Procedure: LAPAROSCOPIC CHOLECYSTECTOMY;  Surgeon: Kallie Manuelita BROCKS, MD;  Location: [facility];  Service: General;  Laterality: N/A;   COLONOSCOPY N/A 02/29/2020   Procedure: COLONOSCOPY;  Surgeon: Golda Claudis PENNER, MD;  [facility]: [facility];  Service: Endoscopy;  Laterality: N/A;  8:45   ESOPHAGEAL DILATION N/A 11/17/2017   Procedure: ESOPHAGEAL DILATION;  Surgeon: Golda Claudis PENNER, MD;  [facility]: [facility];  Service: Endoscopy;  Laterality:  N/A;   ESOPHAGOGASTRODUODENOSCOPY N/A 11/17/2017   Procedure: ESOPHAGOGASTRODUODENOSCOPY (EGD);  Surgeon: Golda Claudis PENNER, MD;  [facility]: [facility];  Service: Endoscopy;  Laterality: N/A;  12:00   ESOPHAGOGASTRODUODENOSCOPY N/A 11/02/2019   Procedure: ESOPHAGOGASTRODUODENOSCOPY (EGD);  Surgeon: Golda Claudis PENNER, MD;  [facility];  Service: Endoscopy;  Laterality: N/A;  945   ESOPHAGOGASTRODUODENOSCOPY (EGD) WITH PROPOFOL  N/A 12/29/2022   Procedure: ESOPHAGOGASTRODUODENOSCOPY (EGD) WITH PROPOFOL ;  Surgeon: Cinderella Deatrice FALCON, MD;  [facility]: [facility];  Service: Endoscopy;  Laterality: N/A;  11:15AM;ASA 3   FOOT SURGERY     POLYPECTOMY  02/29/2020   Procedure: POLYPECTOMY;  Surgeon: Golda Claudis PENNER, MD;  [facility]: [facility];  Service: Endoscopy;;   REVISION TOTAL HIP ARTHROPLASTY Left     Current Outpatient Medications  Medication Sig Dispense Refill Last Dose/Taking   aspirin  EC 81 MG tablet Take 1 tablet (81 mg total) by mouth at bedtime.      CINNAMON PO Take by mouth. 2,000 daily      dexlansoprazole  (DEXILANT ) 60 MG capsule TAKE (1) CAPSULE BY MOUTH ONCE DAILY. 30 capsule 11    Evolocumab  (REPATHA  SURECLICK) 140 MG/ML SOAJ Inject 140 mg into the skin every 14 (fourteen) days. 6 mL 3    fluticasone (FLONASE) 50 MCG/ACT nasal spray Place 2 sprays into both nostrils daily.      GLUCOSAMINE-CALCIUM -VIT D PO Take by mouth daily.      guaiFENesin (MUCINEX PO) Take by mouth as needed. Takes as needed.      LORazepam (ATIVAN) 1 MG tablet Take 1 mg by mouth at bedtime.      Multiple Vitamin (MULTIVITAMIN) tablet Take 1 tablet by mouth daily.      ondansetron  (ZOFRAN ) 4 MG tablet TAKE ONE TABLET BY MOUTH EVERY 8 HOURS AS NEEDED FOR NAUSEA OR VOMITING 30 tablet 1    Polyethyl Glycol-Propyl Glycol (SYSTANE OP) Apply to eye. One drop each eye twice daily      Polyethylene Glycol 3350  (MIRALAX PO) Take by mouth as needed.      rosuvastatin  (CRESTOR ) 20 MG tablet Take 1 tablet 3 times per week ( M,W,F) 90 tablet 3    sertraline (ZOLOFT) 25 MG tablet Take 25 mg by mouth daily.      No current facility-administered medications for this visit.   Allergies  Allergen Reactions   Codeine Other (See Comments)    Syncope    Demerol  Rash   Elemental Sulfur  Rash   Feldene [Piroxicam] Rash    Latex Rash   Pyridium [Phenazopyridine Hcl] Itching and Rash    Social History   Tobacco Use   Smoking status: Never    Passive exposure: Past   Smokeless tobacco: Never  Substance Use Topics   Alcohol use: No    Family History  Problem Relation Age of Onset   Stroke Mother    Stroke Father    Neuropathy Father    Cancer Maternal Uncle    Cancer Paternal Uncle    Cancer Paternal Uncle    Neuropathy Child       Review of Systems  Musculoskeletal:  Positive for arthralgias.  All other systems reviewed and are negative.   Objective:  Physical Exam Constitutional:      General: She is not in acute distress.    Appearance: Normal appearance. She is not ill-appearing.  HENT:     Head: Normocephalic and atraumatic.     Right Ear: External ear normal.  Left Ear: External ear normal.     Nose: Nose normal.     Mouth/Throat:     Mouth: Mucous membranes are moist.     Pharynx: Oropharynx is clear.  Eyes:     Extraocular Movements: Extraocular movements intact.     Conjunctiva/sclera: Conjunctivae normal.  Cardiovascular:     Rate and Rhythm: Normal rate and regular rhythm.     Pulses: Normal pulses.     Heart sounds: Normal heart sounds.  Pulmonary:     Effort: Pulmonary effort is normal.     Breath sounds: Normal breath sounds.  Abdominal:     General: Bowel sounds are normal.     Palpations: Abdomen is soft.     Tenderness: There is no abdominal tenderness.  Musculoskeletal:        General: Tenderness present.     Cervical back: Normal range of motion and neck supple.     Comments: TTP over groin, lateral aspect, greater trochanter.   No significant swelling.  Well healed anterior incision over left hip.  No overlying lesions of area of chief complaint.  Decreased strength and ROM due to elicited pain.  Dorsiflexion and plantarflexion intact.  BLE appear grossly neurovascularly intact.  Gait mildly antalgic without use of assistive device.   Skin:     General: Skin is warm and dry.  Neurological:     Mental Status: She is alert and oriented to person, place, and time. Mental status is at baseline.  Psychiatric:        Mood and Affect: Mood normal.        Behavior: Behavior normal.     Vital signs in last 24 hours: @VSRANGES @   Labs:   Estimated body mass index is 23.49 kg/m as calculated from the following:   Height as of 03/11/23: 5' 7 (1.702 m).   Weight as of 03/11/23: 68 kg.  Imaging Review:  Plain radiographs demonstrate radiolucent border of femoral stem, with subsidence into the femur and evidence of loosening of the femoral stem, left hip(s).  The bone quality appears to be fair for age and reported activity level. The acetabular cup appears well fixed and in good position.    Assessment/Plan:  Aseptic loosening, left hip(s) with failed previous arthroplasty.  The patient history, physical examination, clinical judgement of the provider and imaging studies are consistent with aseptic loosening of the left hip(s), specifically of the femoral stem and without overt evidence of loosening of the acetabular cup, previous total hip arthroplasty. Revision total hip arthroplasty, specifically the femoral stem, is deemed medically necessary. The treatment options including medical management, injection therapy, arthroscopy and arthroplasty were discussed at length. The risks and benefits of total hip arthroplasty were presented and reviewed. The risks due to aseptic loosening, infection, stiffness, dislocation/subluxation,  thromboembolic complications and other imponderables were discussed.  The patient acknowledged the explanation, agreed to proceed with the plan and consent was signed. Patient is being admitted for inpatient treatment for surgery, pain control, PT, OT, prophylactic antibiotics, VTE prophylaxis, progressive ambulation and ADL's and discharge planning. The patient is planning to be discharged home with home health  services ([facility]).

## 2023-07-22 NOTE — H&P (Signed)
 TOTAL HIP REVISION ADMISSION H&P  Patient is admitted for left revision total hip arthroplasty.  Subjective:  Chief Complaint: left hip pain  HPI: Laurie Bridges, 78 y.o. female, has a history of pain and functional disability in the left hip due to aseptic loosening and patient has failed non-surgical conservative treatments for greater than 12 weeks to include NSAID's and/or analgesics, use of assistive devices, and activity modification. The indications for the revision total hip arthroplasty are loosening of one or more components.  Onset of symptoms was gradual starting 2-3 years ago since the original hip replacement while gradually worsening course since that time.  Prior procedures on the left hip include arthroplasty.  Patient currently rates pain in the left hip at 10 out of 10 with activity.  There is night pain, worsening of pain with activity and weight bearing, pain that interfers with activities of daily living, and pain with passive range of motion. Patient has evidence of prosthetic loosening of the femoral component and subsidence in the femur by imaging studies.  This condition presents safety issues increasing the risk of falls.   There is no current active infection.  Patient Active Problem List   Diagnosis Date Noted   Hyperlipidemia LDL goal <70 04/20/2023   Impacted cerumen of both ears 03/13/2023   Chronic rhinitis 03/13/2023   Deviated nasal septum 03/13/2023   Hypertrophy of nasal turbinates 03/13/2023   Concha bullosa 03/13/2023   Constipation 03/02/2023   Gastritis and gastroduodenitis 12/29/2022   Abdominal pain, epigastric 12/17/2022   Nausea without vomiting 12/17/2022   Low ferritin level 12/17/2022   Gastroparesis 02/06/2021   Postprandial bloating 11/21/2020   Early satiety 11/21/2020   Gallstones 11/17/2018   Gastroesophageal reflux disease without esophagitis 09/14/2017   Esophageal dysphagia 09/14/2017   Dyspnea 08/02/2013   GERD  (gastroesophageal reflux disease) 08/02/2013   Past Medical History:  Diagnosis Date   Anemia    hx of Fe def many years ago   Anxiety    Arthritis    back   CAD (coronary artery disease)    a. s/p Coronary CT in 06/2018 showing mild stenosis along the mid-LAD, mid-LCx and mid-RCA   GERD (gastroesophageal reflux disease)    Hypertension    PONV (postoperative nausea and vomiting)    Pre-diabetes     Past Surgical History:  Procedure Laterality Date   ABDOMINAL HYSTERECTOMY     partial hysterectomy, ovaries remain   BACK SURGERY     BIOPSY  12/29/2022   Procedure: BIOPSY;  Surgeon: Cinderella Deatrice FALCON, MD;  [facility];  Service: Endoscopy;;   CATARACT EXTRACTION W/PHACO Right 01/15/2023   Procedure: CATARACT EXTRACTION PHACO AND INTRAOCULAR LENS PLACEMENT (IOC);  Surgeon: Harrie Agent, MD;  Location: [facility];  Service: Ophthalmology;  Laterality: Right;  CDE 6.32   CATARACT EXTRACTION W/PHACO Left 01/29/2023   Procedure: CATARACT EXTRACTION PHACO AND INTRAOCULAR LENS PLACEMENT (IOC);  Surgeon: Harrie Agent, MD;  Location: [facility];  Service: Ophthalmology;  Laterality: Left;  CDE: 7.39   CHOLECYSTECTOMY N/A 11/27/2019   Procedure: LAPAROSCOPIC CHOLECYSTECTOMY;  Surgeon: Kallie Manuelita BROCKS, MD;  Location: [facility];  Service: General;  Laterality: N/A;   COLONOSCOPY N/A 02/29/2020   Procedure: COLONOSCOPY;  Surgeon: Golda Claudis PENNER, MD;  [facility];  Service: Endoscopy;  Laterality: N/A;  8:45   ESOPHAGEAL DILATION N/A 11/17/2017   Procedure: ESOPHAGEAL DILATION;  Surgeon: Golda Claudis PENNER, MD;  [facility];  Service: Endoscopy;  Laterality:  N/A;   ESOPHAGOGASTRODUODENOSCOPY N/A 11/17/2017   Procedure: ESOPHAGOGASTRODUODENOSCOPY (EGD);  Surgeon: Golda Claudis PENNER, MD;  [facility];  Service: Endoscopy;  Laterality: N/A;  12:00   ESOPHAGOGASTRODUODENOSCOPY N/A 11/02/2019   Procedure: ESOPHAGOGASTRODUODENOSCOPY (EGD);  Surgeon: Golda Claudis PENNER, MD;  [facility];  Service: Endoscopy;  Laterality: N/A;  945   ESOPHAGOGASTRODUODENOSCOPY (EGD) WITH PROPOFOL  N/A 12/29/2022   Procedure: ESOPHAGOGASTRODUODENOSCOPY (EGD) WITH PROPOFOL ;  Surgeon: Cinderella Deatrice FALCON, MD;  [facility];  Service: Endoscopy;  Laterality: N/A;  11:15AM;ASA 3   FOOT SURGERY     POLYPECTOMY  02/29/2020   Procedure: POLYPECTOMY;  Surgeon: Golda Claudis PENNER, MD;  [facility];  Service: Endoscopy;;   REVISION TOTAL HIP ARTHROPLASTY Left     Current Outpatient Medications  Medication Sig Dispense Refill Last Dose/Taking   aspirin  EC 81 MG tablet Take 1 tablet (81 mg total) by mouth at bedtime.      CINNAMON PO Take by mouth. 2,000 daily      dexlansoprazole  (DEXILANT ) 60 MG capsule TAKE (1) CAPSULE BY MOUTH ONCE DAILY. 30 capsule 11    Evolocumab  (REPATHA  SURECLICK) 140 MG/ML SOAJ Inject 140 mg into the skin every 14 (fourteen) days. 6 mL 3    fluticasone (FLONASE) 50 MCG/ACT nasal spray Place 2 sprays into both nostrils daily.      GLUCOSAMINE-CALCIUM -VIT D PO Take by mouth daily.      guaiFENesin (MUCINEX PO) Take by mouth as needed. Takes as needed.      LORazepam (ATIVAN) 1 MG tablet Take 1 mg by mouth at bedtime.      Multiple Vitamin (MULTIVITAMIN) tablet Take 1 tablet by mouth daily.      ondansetron  (ZOFRAN ) 4 MG tablet TAKE ONE TABLET BY MOUTH EVERY 8 HOURS AS NEEDED FOR NAUSEA OR VOMITING 30 tablet 1    Polyethyl Glycol-Propyl Glycol (SYSTANE OP) Apply to eye. One drop each eye twice daily      Polyethylene Glycol 3350  (MIRALAX PO) Take by mouth as needed.      rosuvastatin  (CRESTOR ) 20 MG tablet Take 1 tablet 3 times per week ( M,W,F) 90 tablet 3    sertraline (ZOLOFT) 25 MG tablet Take 25 mg by mouth daily.      No current facility-administered medications for this visit.   Allergies  Allergen Reactions   Codeine Other (See Comments)    Syncope    Demerol  Rash   Elemental Sulfur  Rash   Feldene [Piroxicam] Rash    Latex Rash   Pyridium [Phenazopyridine Hcl] Itching and Rash    Social History   Tobacco Use   Smoking status: Never    Passive exposure: Past   Smokeless tobacco: Never  Substance Use Topics   Alcohol use: No    Family History  Problem Relation Age of Onset   Stroke Mother    Stroke Father    Neuropathy Father    Cancer Maternal Uncle    Cancer Paternal Uncle    Cancer Paternal Uncle    Neuropathy Child       Review of Systems  Musculoskeletal:  Positive for arthralgias.  All other systems reviewed and are negative.   Objective:  Physical Exam Constitutional:      General: She is not in acute distress.    Appearance: Normal appearance. She is not ill-appearing.  HENT:     Head: Normocephalic and atraumatic.     Right Ear: External ear normal.  Left Ear: External ear normal.     Nose: Nose normal.     Mouth/Throat:     Mouth: Mucous membranes are moist.     Pharynx: Oropharynx is clear.  Eyes:     Extraocular Movements: Extraocular movements intact.     Conjunctiva/sclera: Conjunctivae normal.  Cardiovascular:     Rate and Rhythm: Normal rate and regular rhythm.     Pulses: Normal pulses.     Heart sounds: Normal heart sounds.  Pulmonary:     Effort: Pulmonary effort is normal.     Breath sounds: Normal breath sounds.  Abdominal:     General: Bowel sounds are normal.     Palpations: Abdomen is soft.     Tenderness: There is no abdominal tenderness.  Musculoskeletal:        General: Tenderness present.     Cervical back: Normal range of motion and neck supple.     Comments: TTP over groin, lateral aspect, greater trochanter.   No significant swelling.  Well healed anterior incision over left hip.  No overlying lesions of area of chief complaint.  Decreased strength and ROM due to elicited pain.  Dorsiflexion and plantarflexion intact.  BLE appear grossly neurovascularly intact.  Gait mildly antalgic without use of assistive device.   Skin:     General: Skin is warm and dry.  Neurological:     Mental Status: She is alert and oriented to person, place, and time. Mental status is at baseline.  Psychiatric:        Mood and Affect: Mood normal.        Behavior: Behavior normal.     Vital signs in last 24 hours: @VSRANGES @   Labs:   Estimated body mass index is 23.49 kg/m as calculated from the following:   Height as of 03/11/23: 5' 7 (1.702 m).   Weight as of 03/11/23: 68 kg.  Imaging Review:  Plain radiographs demonstrate radiolucent border of femoral stem, with subsidence into the femur and evidence of loosening of the femoral stem, left hip(s).  The bone quality appears to be fair for age and reported activity level. The acetabular cup appears well fixed and in good position.    Assessment/Plan:  Aseptic loosening, left hip(s) with failed previous arthroplasty.  The patient history, physical examination, clinical judgement of the provider and imaging studies are consistent with aseptic loosening of the left hip(s), specifically of the femoral stem and without overt evidence of loosening of the acetabular cup, previous total hip arthroplasty. Revision total hip arthroplasty, specifically the femoral stem, is deemed medically necessary. The treatment options including medical management, injection therapy, arthroscopy and arthroplasty were discussed at length. The risks and benefits of total hip arthroplasty were presented and reviewed. The risks due to aseptic loosening, infection, stiffness, dislocation/subluxation,  thromboembolic complications and other imponderables were discussed.  The patient acknowledged the explanation, agreed to proceed with the plan and consent was signed. Patient is being admitted for inpatient treatment for surgery, pain control, PT, OT, prophylactic antibiotics, VTE prophylaxis, progressive ambulation and ADL's and discharge planning. The patient is planning to be discharged home with home health  services ([facility]).

## 2023-08-02 ENCOUNTER — Ambulatory Visit: Payer: Self-pay | Admitting: Emergency Medicine

## 2023-08-03 NOTE — Progress Notes (Addendum)
 Anesthesia Review:  PCP: Gaither Langton  Clearance 06/29/23 located in Media Tab.  Cardiologist : Vina gull  Clearance- rosabel Devora Piety on 07/07/23 by telephone visit   PPM/ ICD: Device Orders: Rep Notified:  Chest x-ray : EKG : 12/25/22  and 08/04/23  Echo : 2022  Monitor- 2023  Ct cors- 2020  Stress test: Cardiac Cath :   Activity level: can do a flight of stairs without difficutly  Sleep Study/ CPAP : none  Fasting Blood Sugar :      / Checks Blood Sugar -- times a day:     Prediabetes- on no meds does not check glucose at home    Blood Thinner/ Instructions /Last Dose: ASA / Instructions/ Last Dose :    81 mg aspirin     Hx of hypertension - has not been on meds in years per pt.   PT called on 08/09/23 and asked for call back.  Called pt back at 1040 am on 08/09/23.  [facility]  PT called back on 08/09/23 and stated DR Edna asked her to stop Repatha  prior to surgery and wondered if she should stop Cholesterol po med.  Instructed pt to call Dr Edna office in regards to that since he had her stop her Repatha .  PT voiced understanding.

## 2023-08-03 NOTE — Patient Instructions (Signed)
 SURGICAL WAITING ROOM VISITATION  Patients having surgery or a procedure may have no more than 2 support people in the waiting area - these visitors may rotate.    Children under the age of 80 must have an adult with them who is not the patient.  Visitors with respiratory illnesses are discouraged from visiting and should remain at home.  If the patient needs to stay at the hospital during part of their recovery, the visitor guidelines for inpatient rooms apply. Pre-op nurse will coordinate an appropriate time for 1 support person to accompany patient in pre-op.  This support person may not rotate.    Please refer to the [facility] website for the visitor guidelines for Inpatients (after your surgery is over and you are in a regular room).       Your procedure is scheduled on:  08/16/2023    [facility] Main Entr[facility]    Report to admitting at  1000AM   Call this number if you have problems the morning of surgery [phone]   Do not eat food :After Midnight.   After Midnight you may have the following liquids until  0930______ AM DAY OF SURGERY  Water  Non-Citrus Juices (without pulp, NO RED-Apple, White grape, White cranberry) Black Coffee (NO MILK/CREAM OR CREAMERS, sugar ok)  Clear Tea (NO MILK/CREAM OR CREAMERS, sugar ok) regular and decaf                             Plain Jell-O (NO RED)                                           Fruit ices (not with fruit pulp, NO RED)                                     Popsicles (NO RED)                                                               Sports drinks like Gatorade (NO RED)                    The day of surgery:  Drink ONE (1) Pre-Surgery Clear Ensure or G2 at  0930AM   ( have completed by ) the morning of surgery. Drink in one sitting. Do not sip.  This drink was given to you during your hospital  pre-op appointment visit. Nothing else to drink after completing the  Pre-Surgery Clear Ensure or G2.           If you have questions, please contact your surgeon's office.       Oral Hygiene is also important to reduce your risk of infection.                                    Remember - BRUSH YOUR TEETH THE MORNING OF SURGERY WITH YOUR REGULAR TOOTHPASTE  DENTURES WILL BE REMOVED PRIOR TO SURGERY PLEASE DO  NOT APPLY Poly grip OR ADHESIVES!!!   Do NOT smoke after Midnight   Stop all vitamins and herbal supplements 7 days before surgery.   Take these medicines the morning of surgery with A SIP OF WATER :  dexilant , flonase   DO NOT TAKE ANY ORAL DIABETIC MEDICATIONS DAY OF YOUR SURGERY  Bring CPAP mask and tubing day of surgery.                              You may not have any metal on your body including hair pins, jewelry, and body piercing             Do not wear make-up, lotions, powders, perfumes/cologne, or deodorant  Do not wear nail polish including gel and S&S, artificial/acrylic nails, or any other type of covering on natural nails including finger and toenails. If you have artificial nails, gel coating, etc. that needs to be removed by a nail salon please have this removed prior to surgery or surgery may need to be canceled/ delayed if the surgeon/ anesthesia feels like they are unable to be safely monitored.   Do not shave  48 hours prior to surgery.               Men may shave face and neck.   Do not bring valuables to the hospital. [facility] IS NOT             RESPONSIBLE   FOR VALUABLES.   Contacts, glasses, dentures or bridgework may not be worn into surgery.   Bring small overnight bag day of surgery.   DO NOT BRING YOUR HOME MEDICATIONS TO THE HOSPITAL. PHARMACY WILL DISPENSE MEDICATIONS LISTED ON YOUR MEDICATION LIST TO YOU DURING YOUR ADMISSION IN THE HOSPITAL!    Patients discharged on the day of surgery will not be allowed to drive home.  Someone NEEDS to stay with you for the first 24 hours after anesthesia.   Special Instructions: Bring a copy of your  healthcare power of attorney and living will documents the day of surgery if you haven't scanned them before.              Please read over the following fact sheets you were given: IF YOU HAVE QUESTIONS ABOUT YOUR PRE-OP INSTRUCTIONS PLEASE CALL [phone].   If you received a COVID test during your pre-op visit  it is requested that you wear a mask when out in public, stay away from anyone that may not be feeling well and notify your surgeon if you develop symptoms. If you test positive for Covid or have been in contact with anyone that has tested positive in the last 10 days please notify you surgeon.      Pre-operative 5 CHG Bath Instructions   You can play a key role in reducing the risk of infection after surgery. Your skin needs to be as free of germs as possible. You can reduce the number of germs on your skin by washing with CHG (chlorhexidine  gluconate) soap before surgery. CHG is an antiseptic soap that kills germs and continues to kill germs even after washing.   DO NOT use if you have an allergy to chlorhexidine /CHG or antibacterial soaps. If your skin becomes reddened or irritated, stop using the CHG and notify one of our RNs at [phone].   Please shower with the CHG soap starting 4 days before surgery using the following schedule:  Please keep in mind the following:  DO NOT shave, including legs and underarms, starting the day of your first shower.   You may shave your face at any point before/day of surgery.  Place clean sheets on your bed the day you start using CHG soap. Use a clean washcloth (not used since being washed) for each shower. DO NOT sleep with pets once you start using the CHG.   CHG Shower Instructions:  If you choose to wash your hair and private area, wash first with your normal shampoo/soap.  After you use shampoo/soap, rinse your hair and body thoroughly to remove shampoo/soap residue.  Turn the water  OFF and apply about 3 tablespoons (45 ml) of  CHG soap to a CLEAN washcloth.  Apply CHG soap ONLY FROM YOUR NECK DOWN TO YOUR TOES (washing for 3-5 minutes)  DO NOT use CHG soap on face, private areas, open wounds, or sores.  Pay special attention to the area where your surgery is being performed.  If you are having back surgery, having someone wash your back for you may be helpful. Wait 2 minutes after CHG soap is applied, then you may rinse off the CHG soap.  Pat dry with a clean towel  Put on clean clothes/pajamas   If you choose to wear lotion, please use ONLY the CHG-compatible lotions on the back of this paper.     Additional instructions for the day of surgery: DO NOT APPLY any lotions, deodorants, cologne, or perfumes.   Put on clean/comfortable clothes.  Brush your teeth.  Ask your nurse before applying any prescription medications to the skin.      CHG Compatible Lotions   Aveeno Moisturizing lotion  Cetaphil Moisturizing Cream  Cetaphil Moisturizing Lotion  Clairol Herbal Essence Moisturizing Lotion, Dry Skin  Clairol Herbal Essence Moisturizing Lotion, Extra Dry Skin  Clairol Herbal Essence Moisturizing Lotion, Normal Skin  Curel Age Defying Therapeutic Moisturizing Lotion with Alpha Hydroxy  Curel Extreme Care Body Lotion  Curel Soothing Hands Moisturizing Hand Lotion  Curel Therapeutic Moisturizing Cream, Fragrance-Free  Curel Therapeutic Moisturizing Lotion, Fragrance-Free  Curel Therapeutic Moisturizing Lotion, Original Formula  Eucerin Daily Replenishing Lotion  Eucerin Dry Skin Therapy Plus Alpha Hydroxy Crme  Eucerin Dry Skin Therapy Plus Alpha Hydroxy Lotion  Eucerin Original Crme  Eucerin Original Lotion  Eucerin Plus Crme Eucerin Plus Lotion  Eucerin TriLipid Replenishing Lotion  Keri Anti-Bacterial Hand Lotion  Keri Deep Conditioning Original Lotion Dry Skin Formula Softly Scented  Keri Deep Conditioning Original Lotion, Fragrance Free Sensitive Skin Formula  Keri Lotion Fast Absorbing  Fragrance Free Sensitive Skin Formula  Keri Lotion Fast Absorbing Softly Scented Dry Skin Formula  Keri Original Lotion  Keri Skin Renewal Lotion Keri Silky Smooth Lotion  Keri Silky Smooth Sensitive Skin Lotion  Nivea Body Creamy Conditioning Oil  Nivea Body Extra Enriched Teacher, adult education Moisturizing Lotion Nivea Crme  Nivea Skin Firming Lotion  NutraDerm 30 Skin Lotion  NutraDerm Skin Lotion  NutraDerm Therapeutic Skin Cream  NutraDerm Therapeutic Skin Lotion  ProShield Protective Hand Cream  Provon moisturizing lotion

## 2023-08-04 ENCOUNTER — Encounter (HOSPITAL_COMMUNITY)
Admission: RE | Admit: 2023-08-04 | Discharge: 2023-08-04 | Disposition: A | Discharge status: Home / Self Care | Source: Ambulatory Visit | Attending: Orthopedic Surgery | Admitting: Orthopedic Surgery

## 2023-08-04 ENCOUNTER — Other Ambulatory Visit: Payer: Self-pay

## 2023-08-04 ENCOUNTER — Encounter (HOSPITAL_COMMUNITY): Payer: Self-pay

## 2023-08-04 VITALS — BP 144/91 | HR 77 | Temp 98.2°F | Resp 16 | Ht 66.5 in | Wt 148.0 lb

## 2023-08-04 DIAGNOSIS — Z01818 Encounter for other preprocedural examination: Secondary | ICD-10-CM | POA: Insufficient documentation

## 2023-08-04 DIAGNOSIS — T8484XA Pain due to internal orthopedic prosthetic devices, implants and grafts, initial encounter: Secondary | ICD-10-CM | POA: Diagnosis not present

## 2023-08-04 DIAGNOSIS — M25552 Pain in left hip: Secondary | ICD-10-CM | POA: Insufficient documentation

## 2023-08-04 DIAGNOSIS — Z96642 Presence of left artificial hip joint: Secondary | ICD-10-CM | POA: Diagnosis not present

## 2023-08-04 DIAGNOSIS — I2510 Atherosclerotic heart disease of native coronary artery without angina pectoris: Secondary | ICD-10-CM | POA: Insufficient documentation

## 2023-08-04 DIAGNOSIS — G8929 Other chronic pain: Secondary | ICD-10-CM | POA: Diagnosis not present

## 2023-08-04 DIAGNOSIS — N189 Chronic kidney disease, unspecified: Secondary | ICD-10-CM | POA: Insufficient documentation

## 2023-08-04 DIAGNOSIS — Z01812 Encounter for preprocedural laboratory examination: Secondary | ICD-10-CM | POA: Diagnosis present

## 2023-08-04 DIAGNOSIS — I129 Hypertensive chronic kidney disease with stage 1 through stage 4 chronic kidney disease, or unspecified chronic kidney disease: Secondary | ICD-10-CM | POA: Insufficient documentation

## 2023-08-04 DIAGNOSIS — J449 Chronic obstructive pulmonary disease, unspecified: Secondary | ICD-10-CM | POA: Diagnosis not present

## 2023-08-04 DIAGNOSIS — Z01810 Encounter for preprocedural cardiovascular examination: Secondary | ICD-10-CM | POA: Diagnosis present

## 2023-08-04 HISTORY — DX: Chronic kidney disease, unspecified: N18.9

## 2023-08-04 HISTORY — DX: Malignant (primary) neoplasm, unspecified: C80.1

## 2023-08-04 LAB — CBC WITH DIFFERENTIAL/PLATELET
Abs Immature Granulocytes: 0.01 K/uL (ref 0.00–0.07)
Basophils Absolute: 0 K/uL (ref 0.0–0.1)
Basophils Relative: 1 %
Eosinophils Absolute: 0.3 K/uL (ref 0.0–0.5)
Eosinophils Relative: 6 %
HCT: 42.9 % (ref 36.0–46.0)
Hemoglobin: 13.8 g/dL (ref 12.0–15.0)
Immature Granulocytes: 0 %
Lymphocytes Relative: 27 %
Lymphs Abs: 1.4 K/uL (ref 0.7–4.0)
MCH: 29.4 pg (ref 26.0–34.0)
MCHC: 32.2 g/dL (ref 30.0–36.0)
MCV: 91.3 fL (ref 80.0–100.0)
Monocytes Absolute: 0.5 K/uL (ref 0.1–1.0)
Monocytes Relative: 11 %
Neutro Abs: 2.8 K/uL (ref 1.7–7.7)
Neutrophils Relative %: 55 %
Platelets: 284 K/uL (ref 150–400)
RBC: 4.7 MIL/uL (ref 3.87–5.11)
RDW: 12.6 % (ref 11.5–15.5)
WBC: 5.1 K/uL (ref 4.0–10.5)
nRBC: 0 % (ref 0.0–0.2)

## 2023-08-04 LAB — TYPE AND SCREEN
ABO/RH(D): O POS
Antibody Screen: NEGATIVE

## 2023-08-04 LAB — COMPREHENSIVE METABOLIC PANEL WITH GFR
ALT: 17 U/L (ref 0–44)
AST: 20 U/L (ref 15–41)
Albumin: 3.7 g/dL (ref 3.5–5.0)
Alkaline Phosphatase: 79 U/L (ref 38–126)
Anion gap: 6 (ref 5–15)
BUN: 17 mg/dL (ref 8–23)
CO2: 26 mmol/L (ref 22–32)
Calcium: 9.9 mg/dL (ref 8.9–10.3)
Chloride: 107 mmol/L (ref 98–111)
Creatinine, Ser: 0.92 mg/dL (ref 0.44–1.00)
GFR, Estimated: >60 mL/min (ref >60)
Glucose, Bld: 105 mg/dL — ABNORMAL HIGH (ref 70–99)
Potassium: 4.3 mmol/L (ref 3.5–5.1)
Sodium: 139 mmol/L (ref 135–145)
Total Bilirubin: 0.7 mg/dL (ref 0.0–1.2)
Total Protein: 6.7 g/dL (ref 6.5–8.1)

## 2023-08-04 LAB — SURGICAL PCR SCREEN
MRSA, PCR: NEGATIVE
Staphylococcus aureus: NEGATIVE

## 2023-08-05 NOTE — Progress Notes (Signed)
 Anesthesia Chart Review   Case: [id number] Date/Time: 08/16/23 1215   Procedures:      REVISION, ARTHROPLASTY, HIP (Left: Hip)     APPLICATION, WOUND VAC (Left)   Anesthesia type: Spinal   Pre-op diagnosis: PAINFUL LEFT HIP PROSTHESIS   Location: [facility] ROOM 08 / [facility]   Surgeons: Edna Toribio LABOR, MD       DISCUSSION:78 y.o. never smoker with h/o PONV, HTN, COPD, CAD (s/p coronary CTA in June 2020 showing mild narrowing in mid LAD, mid LCx and mid RCA), CKD, painful left hip prosthesis scheduled for above procedure 08/16/2023 with Dr. Toribio Edna.   Per cardiology preoperative evaluation 07/07/2023, Ms. Huck perioperative risk of a major cardiac event is 0.4% according to the Revised Cardiac Risk Index (RCRI).  Therefore, she is at low risk for perioperative complications.   Her functional capacity is good at 6.05 METs according to the Duke Activity Status Index (DASI). Recommendations: According to ACC/AHA guidelines, no further cardiovascular testing needed.  The patient may proceed to surgery at acceptable risk.   Antiplatelet and/or Anticoagulation Recommendations: Ideally aspirin  should be continued without interruption, however if the bleeding risk is too great, aspirin  may be held for 5-7 days prior to surgery. Please resume aspirin  post operatively when it is felt to be safe from a bleeding standpoint.  VS: BP (!) 144/91   Pulse 77   Temp 36.8 C (Oral)   Resp 16   Ht 5' 6.5 (1.689 m)   Wt 67.1 kg   SpO2 97%   BMI 23.53 kg/m   PROVIDERS: Sheryle Carwin, MD is PCP    LABS: Labs reviewed: Acceptable for surgery. (all labs ordered are listed, but only abnormal results are displayed)  Labs Reviewed  COMPREHENSIVE METABOLIC PANEL WITH GFR - Abnormal; Notable for the following components:      Result Value   Glucose, Bld 105 (*)    All other components within normal limits  SURGICAL PCR SCREEN  CBC WITH DIFFERENTIAL/PLATELET  TYPE AND SCREEN      IMAGES:   EKG:   CV: Echo 11/13/20 1. Left ventricular ejection fraction, by estimation, is 60 to 65%. The  left ventricle has normal function. The left ventricle has no regional  wall motion abnormalities. Left ventricular diastolic parameters are  indeterminate.   2. Right ventricular systolic function is normal. The right ventricular  size is normal. There is normal pulmonary artery systolic pressure. The  estimated right ventricular systolic pressure is 25.5 mmHg.   3. The mitral valve is grossly normal. Trivial mitral valve  regurgitation.   4. The aortic valve is tricuspid. Aortic valve regurgitation is not  visualized.   5. The inferior vena cava is normal in size with greater than 50%  respiratory variability, suggesting right atrial pressure of 3 mmHg.   Comparison(s): Prior images reviewed side by side. LVEF remains normal.  Past Medical History:  Diagnosis Date   Anemia    hx of Fe def many years ago   Anxiety    Arthritis    back   CAD (coronary artery disease)    a. s/p Coronary CT in 06/2018 showing mild stenosis along the mid-LAD, mid-LCx and mid-RCA   Cancer (HCC)    carcinoma removed on head 2024   Chronic kidney disease    GERD (gastroesophageal reflux disease)    Hypertension    no meds in several years   PONV (postoperative nausea and vomiting)    Pre-diabetes  Past Surgical History:  Procedure Laterality Date   ABDOMINAL HYSTERECTOMY     partial hysterectomy, ovaries remain   BACK SURGERY     BIOPSY  12/29/2022   Procedure: BIOPSY;  Surgeon: Cinderella Deatrice FALCON, MD;  [facility];  Service: Endoscopy;;   CATARACT EXTRACTION W/PHACO Right 01/15/2023   Procedure: CATARACT EXTRACTION PHACO AND INTRAOCULAR LENS PLACEMENT (IOC);  Surgeon: Harrie Agent, MD;  Location: [facility];  Service: Ophthalmology;  Laterality: Right;  CDE 6.32   CATARACT EXTRACTION W/PHACO Left 01/29/2023   Procedure: CATARACT EXTRACTION PHACO AND INTRAOCULAR  LENS PLACEMENT (IOC);  Surgeon: Harrie Agent, MD;  Location: [facility];  Service: Ophthalmology;  Laterality: Left;  CDE: 7.39   CHOLECYSTECTOMY N/A 11/27/2019   Procedure: LAPAROSCOPIC CHOLECYSTECTOMY;  Surgeon: Kallie Manuelita BROCKS, MD;  Location: [facility];  Service: General;  Laterality: N/A;   COLONOSCOPY N/A 02/29/2020   Procedure: COLONOSCOPY;  Surgeon: Golda Claudis PENNER, MD;  [facility];  Service: Endoscopy;  Laterality: N/A;  8:45   ESOPHAGEAL DILATION N/A 11/17/2017   Procedure: ESOPHAGEAL DILATION;  Surgeon: Golda Claudis PENNER, MD;  [facility];  Service: Endoscopy;  Laterality: N/A;   ESOPHAGOGASTRODUODENOSCOPY N/A 11/17/2017   Procedure: ESOPHAGOGASTRODUODENOSCOPY (EGD);  Surgeon: Golda Claudis PENNER, MD;  [facility];  Service: Endoscopy;  Laterality: N/A;  12:00   ESOPHAGOGASTRODUODENOSCOPY N/A 11/02/2019   Procedure: ESOPHAGOGASTRODUODENOSCOPY (EGD);  Surgeon: Golda Claudis PENNER, MD;  [facility];  Service: Endoscopy;  Laterality: N/A;  945   ESOPHAGOGASTRODUODENOSCOPY (EGD) WITH PROPOFOL  N/A 12/29/2022   Procedure: ESOPHAGOGASTRODUODENOSCOPY (EGD) WITH PROPOFOL ;  Surgeon: Cinderella Deatrice FALCON, MD;  [facility];  Service: Endoscopy;  Laterality: N/A;  11:15AM;ASA 3   FOOT SURGERY     POLYPECTOMY  02/29/2020   Procedure: POLYPECTOMY;  Surgeon: Golda Claudis PENNER, MD;  [facility];  Service: Endoscopy;;   REVISION TOTAL HIP ARTHROPLASTY Left     MEDICATIONS:  aspirin  EC 81 MG tablet   cetirizine (ZYRTEC) 10 MG tablet   Cholecalciferol (VITAMIN D3) 50 MCG (2000 UT) capsule   ciclopirox (LOPROX) 0.77 % cream   CINNAMON PO   dexlansoprazole  (DEXILANT ) 60 MG capsule   Evolocumab  (REPATHA  SURECLICK) 140 MG/ML SOAJ   fluticasone (FLONASE) 50 MCG/ACT nasal spray   guaiFENesin (MUCINEX PO)   LORazepam (ATIVAN) 1 MG tablet   Multiple Vitamin (MULTIVITAMIN) tablet   ondansetron  (ZOFRAN ) 4 MG tablet   Polyethylene Glycol 3350   (MIRALAX PO)   rosuvastatin  (CRESTOR ) 20 MG tablet   urea  (CARMOL) 40 % CREA   No current facility-administered medications for this encounter.    Harlene Hoots Ward, PA-C [facility] [phone]

## 2023-08-05 NOTE — Anesthesia Preprocedure Evaluation (Addendum)
 Anesthesia Evaluation  Patient identified by MRN, date of birth, ID band Patient awake    Reviewed: Allergy & Precautions, NPO status , Patient's Chart, lab work & pertinent test results  History of Anesthesia Complications (+) history of anesthetic complications  Airway Mallampati: II  TM Distance: >3 FB Neck ROM: Full    Dental no notable dental hx.    Pulmonary neg pulmonary ROS   Pulmonary exam normal        Cardiovascular hypertension, Normal cardiovascular exam     Neuro/Psych   Anxiety     negative neurological ROS     GI/Hepatic Neg liver ROS,GERD  Medicated and Controlled,,  Endo/Other  negative endocrine ROS    Renal/GU negative Renal ROS     Musculoskeletal  (+) Arthritis ,    Abdominal   Peds  Hematology negative hematology ROS (+) PLT: 284   Anesthesia Other Findings PAINFUL LEFT HIP PROSTHESIS  Reproductive/Obstetrics                              Anesthesia Physical Anesthesia Plan  ASA: 2  Anesthesia Plan: Spinal   Post-op Pain Management:    Induction: Intravenous  PONV Risk Score and Plan: 2 and Ondansetron , Dexamethasone , Propofol  infusion and Treatment may vary due to age or medical condition  Airway Management Planned:   Additional Equipment:   Intra-op Plan:   Post-operative Plan:   Informed Consent: I have reviewed the patients History and Physical, chart, labs and discussed the procedure including the risks, benefits and alternatives for the proposed anesthesia with the patient or authorized representative who has indicated his/her understanding and acceptance.     Dental advisory given  Plan Discussed with: CRNA and Surgeon  Anesthesia Plan Comments: (PAT note 08/04/23)         Anesthesia Quick Evaluation

## 2023-08-16 ENCOUNTER — Other Ambulatory Visit: Payer: Self-pay

## 2023-08-16 ENCOUNTER — Inpatient Hospital Stay (HOSPITAL_COMMUNITY): Payer: Self-pay | Admitting: Physician Assistant

## 2023-08-16 ENCOUNTER — Inpatient Hospital Stay (HOSPITAL_COMMUNITY)

## 2023-08-16 ENCOUNTER — Inpatient Hospital Stay (HOSPITAL_COMMUNITY): Admitting: Anesthesiology

## 2023-08-16 ENCOUNTER — Encounter (HOSPITAL_COMMUNITY)
Admission: RE | Disposition: A | Discharge status: Home / Self Care | Payer: Self-pay | Source: Home / Self Care | Attending: Orthopedic Surgery

## 2023-08-16 ENCOUNTER — Encounter (HOSPITAL_COMMUNITY): Payer: Self-pay | Admitting: Orthopedic Surgery

## 2023-08-16 ENCOUNTER — Inpatient Hospital Stay (HOSPITAL_COMMUNITY)
Admission: RE | Admit: 2023-08-16 | Discharge: 2023-08-17 | DRG: 468 | Disposition: A | Discharge status: Home / Self Care | Attending: Orthopedic Surgery | Admitting: Orthopedic Surgery

## 2023-08-16 DIAGNOSIS — T8484XA Pain due to internal orthopedic prosthetic devices, implants and grafts, initial encounter: Secondary | ICD-10-CM | POA: Diagnosis not present

## 2023-08-16 DIAGNOSIS — T84018A Broken internal joint prosthesis, other site, initial encounter: Principal | ICD-10-CM

## 2023-08-16 DIAGNOSIS — T84031A Mechanical loosening of internal left hip prosthetic joint, initial encounter: Secondary | ICD-10-CM | POA: Diagnosis not present

## 2023-08-16 DIAGNOSIS — Z471 Aftercare following joint replacement surgery: Secondary | ICD-10-CM | POA: Diagnosis not present

## 2023-08-16 DIAGNOSIS — J310 Chronic rhinitis: Secondary | ICD-10-CM | POA: Diagnosis not present

## 2023-08-16 DIAGNOSIS — K219 Gastro-esophageal reflux disease without esophagitis: Secondary | ICD-10-CM | POA: Diagnosis present

## 2023-08-16 DIAGNOSIS — Z96642 Presence of left artificial hip joint: Secondary | ICD-10-CM | POA: Diagnosis not present

## 2023-08-16 DIAGNOSIS — I2510 Atherosclerotic heart disease of native coronary artery without angina pectoris: Secondary | ICD-10-CM | POA: Diagnosis not present

## 2023-08-16 DIAGNOSIS — Z79899 Other long term (current) drug therapy: Secondary | ICD-10-CM | POA: Diagnosis not present

## 2023-08-16 DIAGNOSIS — Z90711 Acquired absence of uterus with remaining cervical stump: Secondary | ICD-10-CM | POA: Diagnosis not present

## 2023-08-16 DIAGNOSIS — Y838 Other surgical procedures as the cause of abnormal reaction of the patient, or of later complication, without mention of misadventure at the time of the procedure: Secondary | ICD-10-CM | POA: Diagnosis present

## 2023-08-16 DIAGNOSIS — Y792 Prosthetic and other implants, materials and accessory orthopedic devices associated with adverse incidents: Secondary | ICD-10-CM | POA: Diagnosis present

## 2023-08-16 DIAGNOSIS — M479 Spondylosis, unspecified: Secondary | ICD-10-CM | POA: Diagnosis present

## 2023-08-16 DIAGNOSIS — F419 Anxiety disorder, unspecified: Secondary | ICD-10-CM | POA: Diagnosis present

## 2023-08-16 DIAGNOSIS — R7303 Prediabetes: Secondary | ICD-10-CM | POA: Diagnosis present

## 2023-08-16 DIAGNOSIS — R609 Edema, unspecified: Secondary | ICD-10-CM | POA: Diagnosis not present

## 2023-08-16 DIAGNOSIS — Z7982 Long term (current) use of aspirin: Secondary | ICD-10-CM

## 2023-08-16 DIAGNOSIS — M25552 Pain in left hip: Secondary | ICD-10-CM | POA: Diagnosis present

## 2023-08-16 DIAGNOSIS — I10 Essential (primary) hypertension: Secondary | ICD-10-CM | POA: Diagnosis present

## 2023-08-16 DIAGNOSIS — Z01818 Encounter for other preprocedural examination: Secondary | ICD-10-CM

## 2023-08-16 DIAGNOSIS — E785 Hyperlipidemia, unspecified: Secondary | ICD-10-CM | POA: Diagnosis not present

## 2023-08-16 HISTORY — PX: TOTAL HIP ARTHROPLASTY WITH HARDWARE REMOVAL: SHX6438

## 2023-08-16 HISTORY — PX: APPLICATION OF WOUND VAC: SHX5189

## 2023-08-16 LAB — ABO/RH: ABO/RH(D): O POS

## 2023-08-16 SURGERY — REVISION, ARTHROPLASTY, HIP
Anesthesia: Spinal | Site: Hip | Laterality: Left

## 2023-08-16 MED ORDER — SENNA 8.6 MG PO TABS
1.0000 | ORAL_TABLET | Freq: Two times a day (BID) | ORAL | Status: DC
  Administered 2023-08-16 – 2023-08-17 (×2): 8.6 mg via ORAL
  Filled 2023-08-16 (×2): qty 1

## 2023-08-16 MED ORDER — ONDANSETRON HCL 4 MG/2ML IJ SOLN: 4.0000 mg | Freq: Once | INTRAMUSCULAR | Status: DC | PRN

## 2023-08-16 MED ORDER — CEFAZOLIN SODIUM-DEXTROSE 2-4 GM/100ML-% IV SOLN
2.0000 g | Freq: Three times a day (TID) | INTRAVENOUS | Status: DC
  Administered 2023-08-16 – 2023-08-17 (×2): 2 g via INTRAVENOUS
  Filled 2023-08-16 (×2): qty 100

## 2023-08-16 MED ORDER — HYDROMORPHONE HCL 1 MG/ML IJ SOLN: 0.5000 mg | INTRAMUSCULAR | Status: DC | PRN

## 2023-08-16 MED ORDER — LORATADINE 10 MG PO TABS
10.0000 mg | ORAL_TABLET | Freq: Every day | ORAL | Status: DC
  Administered 2023-08-17: 10 mg via ORAL
  Filled 2023-08-16: qty 1

## 2023-08-16 MED ORDER — BUPIVACAINE IN DEXTROSE 0.75-8.25 % IT SOLN
INTRATHECAL | Status: DC | PRN
  Administered 2023-08-16: 2 mL via INTRATHECAL

## 2023-08-16 MED ORDER — MENTHOL 3 MG MT LOZG: 1.0000 | LOZENGE | OROMUCOSAL | Status: DC | PRN

## 2023-08-16 MED ORDER — BUPIVACAINE LIPOSOME 1.3 % IJ SUSP
INTRAMUSCULAR | Status: AC
  Filled 2023-08-16: qty 20

## 2023-08-16 MED ORDER — ONDANSETRON HCL 4 MG/2ML IJ SOLN
INTRAMUSCULAR | Status: DC | PRN
  Administered 2023-08-16: 4 mg via INTRAVENOUS

## 2023-08-16 MED ORDER — ACETAMINOPHEN 500 MG PO TABS: 1000.0000 mg | ORAL_TABLET | Freq: Three times a day (TID) | ORAL | Status: AC | PRN

## 2023-08-16 MED ORDER — ZOLPIDEM TARTRATE 5 MG PO TABS: 5.0000 mg | ORAL_TABLET | Freq: Every evening | ORAL | Status: DC | PRN

## 2023-08-16 MED ORDER — FENTANYL CITRATE PF 50 MCG/ML IJ SOSY: 25.0000 ug | PREFILLED_SYRINGE | INTRAMUSCULAR | Status: DC | PRN

## 2023-08-16 MED ORDER — DEXAMETHASONE SODIUM PHOSPHATE 10 MG/ML IJ SOLN
INTRAMUSCULAR | Status: AC
  Filled 2023-08-16: qty 1

## 2023-08-16 MED ORDER — ACETAMINOPHEN 500 MG PO TABS
1000.0000 mg | ORAL_TABLET | Freq: Once | ORAL | Status: AC
  Administered 2023-08-16: 1000 mg via ORAL
  Filled 2023-08-16: qty 2

## 2023-08-16 MED ORDER — CEFAZOLIN SODIUM-DEXTROSE 2-4 GM/100ML-% IV SOLN
2.0000 g | INTRAVENOUS | Status: AC
  Administered 2023-08-16: 2 g via INTRAVENOUS
  Filled 2023-08-16: qty 100

## 2023-08-16 MED ORDER — BUPIVACAINE LIPOSOME 1.3 % IJ SUSP: 10.0000 mL | Freq: Once | INTRAMUSCULAR | Status: DC

## 2023-08-16 MED ORDER — ISOPROPYL ALCOHOL 70 % SOLN
Status: DC | PRN
  Administered 2023-08-16: 1 via TOPICAL

## 2023-08-16 MED ORDER — PANTOPRAZOLE SODIUM 40 MG PO TBEC
40.0000 mg | DELAYED_RELEASE_TABLET | Freq: Every day | ORAL | Status: DC
  Administered 2023-08-17: 40 mg via ORAL
  Filled 2023-08-16 (×2): qty 1

## 2023-08-16 MED ORDER — PHENOL 1.4 % MT LIQD: 1.0000 | OROMUCOSAL | Status: DC | PRN

## 2023-08-16 MED ORDER — CEFADROXIL 500 MG PO CAPS: 500.0000 mg | ORAL_CAPSULE | Freq: Two times a day (BID) | ORAL | 0 refills | Status: AC

## 2023-08-16 MED ORDER — AMISULPRIDE (ANTIEMETIC) 5 MG/2ML IV SOLN: 10.0000 mg | Freq: Once | INTRAVENOUS | Status: DC | PRN

## 2023-08-16 MED ORDER — OXYCODONE HCL 5 MG PO TABS: 2.5000 mg | ORAL_TABLET | Freq: Four times a day (QID) | ORAL | 0 refills | Status: AC | PRN

## 2023-08-16 MED ORDER — METHOCARBAMOL 500 MG PO TABS: 500.0000 mg | ORAL_TABLET | Freq: Four times a day (QID) | ORAL | Status: DC | PRN

## 2023-08-16 MED ORDER — BUPIVACAINE-EPINEPHRINE (PF) 0.25% -1:200000 IJ SOLN
INTRAMUSCULAR | Status: AC
  Filled 2023-08-16: qty 30

## 2023-08-16 MED ORDER — ACETAMINOPHEN 325 MG PO TABS: 325.0000 mg | ORAL_TABLET | Freq: Four times a day (QID) | ORAL | Status: DC | PRN

## 2023-08-16 MED ORDER — OXYCODONE HCL 5 MG PO TABS: 5.0000 mg | ORAL_TABLET | ORAL | Status: DC | PRN

## 2023-08-16 MED ORDER — 0.9 % SODIUM CHLORIDE (POUR BTL) OPTIME
TOPICAL | Status: DC | PRN
  Administered 2023-08-16: 1000 mL

## 2023-08-16 MED ORDER — SODIUM CHLORIDE 0.9 % IR SOLN
Status: DC | PRN
  Administered 2023-08-16: 3000 mL

## 2023-08-16 MED ORDER — ONDANSETRON HCL 4 MG/2ML IJ SOLN: 4.0000 mg | Freq: Four times a day (QID) | INTRAMUSCULAR | Status: DC | PRN

## 2023-08-16 MED ORDER — SODIUM CHLORIDE 0.9 % IV SOLN: INTRAVENOUS | Status: DC

## 2023-08-16 MED ORDER — ONDANSETRON HCL 4 MG/2ML IJ SOLN
INTRAMUSCULAR | Status: AC
  Filled 2023-08-16: qty 2

## 2023-08-16 MED ORDER — ROSUVASTATIN CALCIUM 20 MG PO TABS: 20.0000 mg | ORAL_TABLET | ORAL | Status: DC

## 2023-08-16 MED ORDER — ALBUMIN HUMAN 5 % IV SOLN
INTRAVENOUS | Status: AC
Start: 2023-08-16 — End: 2023-08-16
  Filled 2023-08-16: qty 250

## 2023-08-16 MED ORDER — DIPHENHYDRAMINE HCL 12.5 MG/5ML PO ELIX: 12.5000 mg | ORAL_SOLUTION | ORAL | Status: DC | PRN

## 2023-08-16 MED ORDER — ONDANSETRON HCL 4 MG PO TABS: 4.0000 mg | ORAL_TABLET | Freq: Three times a day (TID) | ORAL | 0 refills | Status: AC | PRN

## 2023-08-16 MED ORDER — METHOCARBAMOL 500 MG PO TABS: 500.0000 mg | ORAL_TABLET | Freq: Three times a day (TID) | ORAL | 0 refills | Status: AC | PRN

## 2023-08-16 MED ORDER — LACTATED RINGERS IV SOLN: INTRAVENOUS | Status: DC

## 2023-08-16 MED ORDER — PHENYLEPHRINE HCL-NACL 20-0.9 MG/250ML-% IV SOLN
INTRAVENOUS | Status: DC | PRN
  Administered 2023-08-16: 20 ug/min via INTRAVENOUS

## 2023-08-16 MED ORDER — ALBUMIN HUMAN 5 % IV SOLN
INTRAVENOUS | Status: AC
  Filled 2023-08-16: qty 250

## 2023-08-16 MED ORDER — SODIUM CHLORIDE (PF) 0.9 % IJ SOLN
INTRAMUSCULAR | Status: AC
Start: 2023-08-16 — End: 2023-08-16
  Filled 2023-08-16: qty 10

## 2023-08-16 MED ORDER — PROPOFOL 1000 MG/100ML IV EMUL
INTRAVENOUS | Status: AC
  Filled 2023-08-16: qty 100

## 2023-08-16 MED ORDER — BUPIVACAINE-EPINEPHRINE (PF) 0.25% -1:200000 IJ SOLN
INTRAMUSCULAR | Status: DC | PRN
  Administered 2023-08-16: 80 mL

## 2023-08-16 MED ORDER — TRANEXAMIC ACID-NACL 1000-0.7 MG/100ML-% IV SOLN
1000.0000 mg | INTRAVENOUS | Status: AC
  Administered 2023-08-16: 1000 mg via INTRAVENOUS
  Filled 2023-08-16: qty 100

## 2023-08-16 MED ORDER — ASPIRIN 81 MG PO TBEC: 81.0000 mg | DELAYED_RELEASE_TABLET | Freq: Two times a day (BID) | ORAL | Status: AC

## 2023-08-16 MED ORDER — ISOPROPYL ALCOHOL 70 % SOLN
Status: AC
  Filled 2023-08-16: qty 480

## 2023-08-16 MED ORDER — ALBUMIN HUMAN 5 % IV SOLN: INTRAVENOUS | Status: DC | PRN

## 2023-08-16 MED ORDER — WATER FOR IRRIGATION, STERILE IR SOLN
Status: DC | PRN
  Administered 2023-08-16: 2000 mL

## 2023-08-16 MED ORDER — KETOROLAC TROMETHAMINE 15 MG/ML IJ SOLN
7.5000 mg | Freq: Four times a day (QID) | INTRAMUSCULAR | Status: AC
  Administered 2023-08-16 – 2023-08-17 (×4): 7.5 mg via INTRAVENOUS
  Filled 2023-08-16 (×4): qty 1

## 2023-08-16 MED ORDER — METHOCARBAMOL 1000 MG/10ML IJ SOLN: 500.0000 mg | Freq: Four times a day (QID) | INTRAMUSCULAR | Status: DC | PRN

## 2023-08-16 MED ORDER — ASPIRIN 81 MG PO CHEW
81.0000 mg | CHEWABLE_TABLET | Freq: Two times a day (BID) | ORAL | Status: DC
  Administered 2023-08-16 – 2023-08-17 (×2): 81 mg via ORAL
  Filled 2023-08-16 (×2): qty 1

## 2023-08-16 MED ORDER — CELECOXIB 100 MG PO CAPS: 100.0000 mg | ORAL_CAPSULE | Freq: Two times a day (BID) | ORAL | 0 refills | Status: AC

## 2023-08-16 MED ORDER — DEXAMETHASONE SODIUM PHOSPHATE 10 MG/ML IJ SOLN
8.0000 mg | Freq: Once | INTRAMUSCULAR | Status: AC
  Administered 2023-08-16: 8 mg via INTRAVENOUS

## 2023-08-16 MED ORDER — ACETAMINOPHEN 500 MG PO TABS
1000.0000 mg | ORAL_TABLET | Freq: Four times a day (QID) | ORAL | Status: AC
  Administered 2023-08-16 – 2023-08-17 (×4): 1000 mg via ORAL
  Filled 2023-08-16 (×4): qty 2

## 2023-08-16 MED ORDER — ORAL CARE MOUTH RINSE: 15.0000 mL | Freq: Once | OROMUCOSAL | Status: AC

## 2023-08-16 MED ORDER — GUAIFENESIN ER 600 MG PO TB12: 1200.0000 mg | ORAL_TABLET | Freq: Every day | ORAL | Status: DC | PRN

## 2023-08-16 MED ORDER — CHLORHEXIDINE GLUCONATE 0.12 % MT SOLN
15.0000 mL | Freq: Once | OROMUCOSAL | Status: AC
  Administered 2023-08-16: 15 mL via OROMUCOSAL

## 2023-08-16 MED ORDER — POLYETHYLENE GLYCOL 3350 17 G PO PACK: 17.0000 g | PACK | Freq: Every day | ORAL | Status: DC | PRN

## 2023-08-16 MED ORDER — FLUTICASONE PROPIONATE 50 MCG/ACT NA SUSP
2.0000 | Freq: Two times a day (BID) | NASAL | Status: DC
  Filled 2023-08-16: qty 16

## 2023-08-16 MED ORDER — PROPOFOL 500 MG/50ML IV EMUL
INTRAVENOUS | Status: DC | PRN
  Administered 2023-08-16 (×2): 20 mg via INTRAVENOUS
  Administered 2023-08-16: 30 mg via INTRAVENOUS
  Administered 2023-08-16: 60 ug/kg/min via INTRAVENOUS

## 2023-08-16 MED ORDER — ONDANSETRON HCL 4 MG PO TABS: 4.0000 mg | ORAL_TABLET | Freq: Four times a day (QID) | ORAL | Status: DC | PRN

## 2023-08-16 MED ORDER — DOCUSATE SODIUM 100 MG PO CAPS
100.0000 mg | ORAL_CAPSULE | Freq: Two times a day (BID) | ORAL | Status: DC
  Administered 2023-08-16 – 2023-08-17 (×2): 100 mg via ORAL
  Filled 2023-08-16 (×2): qty 1

## 2023-08-16 MED ORDER — POVIDONE-IODINE 10 % EX SWAB: 2.0000 | Freq: Once | CUTANEOUS | Status: DC

## 2023-08-16 SURGICAL SUPPLY — 76 items
BAG COUNTER SPONGE SURGICOUNT (BAG) IMPLANT
BAG ZIPLOCK 12X15 (MISCELLANEOUS) ×2 IMPLANT
BLADE SAW SAG 25X90X1.19 (BLADE) IMPLANT
BLADE SAW SGTL 81X20 HD (BLADE) IMPLANT
BNDG COHESIVE 6X5 TAN ST LF (GAUZE/BANDAGES/DRESSINGS) IMPLANT
BUR OVAL CARBIDE 4.0 (BURR) IMPLANT
CHLORAPREP W/TINT 26 (MISCELLANEOUS) ×4 IMPLANT
CNTNR URN SCR LID CUP LEK RST (MISCELLANEOUS) IMPLANT
COVER SURGICAL LIGHT HANDLE (MISCELLANEOUS) ×2 IMPLANT
DERMABOND ADVANCED .7 DNX12 (GAUZE/BANDAGES/DRESSINGS) IMPLANT
DRAPE C-ARM 42X120 X-RAY (DRAPES) IMPLANT
DRAPE C-ARMOR (DRAPES) IMPLANT
DRAPE HIP W/POCKET STRL (MISCELLANEOUS) ×2 IMPLANT
DRAPE INCISE IOBAN 66X45 STRL (DRAPES) IMPLANT
DRAPE INCISE IOBAN 85X60 (DRAPES) ×2 IMPLANT
DRAPE POUCH INSTRU U-SHP 10X18 (DRAPES) ×2 IMPLANT
DRAPE SHEET LG 3/4 BI-LAMINATE (DRAPES) ×6 IMPLANT
DRAPE U-SHAPE 47X51 STRL (DRAPES) ×4 IMPLANT
DRSG AQUACEL AG ADV 3.5X10 (GAUZE/BANDAGES/DRESSINGS) IMPLANT
ELECT BLADE TIP CTD 4 INCH (ELECTRODE) ×2 IMPLANT
ELECT NDL TIP 2.8 STRL (NEEDLE) IMPLANT
ELECT NEEDLE TIP 2.8 STRL (NEEDLE) IMPLANT
ELECT PENCIL ROCKER SW 15FT (MISCELLANEOUS) ×2 IMPLANT
ELECT PT RETURN MONO 15F ADT (MISCELLANEOUS) ×2 IMPLANT
ELECT REM PT RETURN 15FT ADLT (MISCELLANEOUS) ×2 IMPLANT
FACESHIELD WRAPAROUND OR TEAM (MASK) IMPLANT
GAUZE SPONGE 4X4 12PLY STRL (GAUZE/BANDAGES/DRESSINGS) IMPLANT
GAUZE SPONGE 4X4 12PLY STRL LF (GAUZE/BANDAGES/DRESSINGS) ×2 IMPLANT
GLOVE BIO SURGEON STRL SZ 6.5 (GLOVE) ×4 IMPLANT
GLOVE BIOGEL PI IND STRL 6.5 (GLOVE) ×2 IMPLANT
GLOVE BIOGEL PI IND STRL 8 (GLOVE) ×2 IMPLANT
GLOVE SURG ORTHO 8.0 STRL STRW (GLOVE) ×4 IMPLANT
GOWN STRL REUS W/ TWL XL LVL3 (GOWN DISPOSABLE) ×4 IMPLANT
HEAD CERAMIC FEMORAL 36MM (Head) IMPLANT
HOLDER FOLEY CATH W/STRAP (MISCELLANEOUS) ×2 IMPLANT
HOOD PEEL AWAY T7 (MISCELLANEOUS) ×6 IMPLANT
KIT BASIN OR (CUSTOM PROCEDURE TRAY) ×2 IMPLANT
KIT HIP STEM REMOVAL BLADES (KITS) IMPLANT
KIT TURNOVER KIT A (KITS) ×2 IMPLANT
MANIFOLD NEPTUNE II (INSTRUMENTS) ×2 IMPLANT
MARKER SKIN DUAL TIP RULER LAB (MISCELLANEOUS) ×2 IMPLANT
NDL SAFETY ECLIPSE 18X1.5 (NEEDLE) ×2 IMPLANT
NS IRRIG 1000ML POUR BTL (IV SOLUTION) ×2 IMPLANT
PACK TOTAL JOINT (CUSTOM PROCEDURE TRAY) ×2 IMPLANT
PAD ARMBOARD POSITIONER FOAM (MISCELLANEOUS) ×2 IMPLANT
PENCIL SMOKE EVACUATOR (MISCELLANEOUS) IMPLANT
PROTECTOR NERVE ULNAR (MISCELLANEOUS) ×2 IMPLANT
RETRIEVER SUT HEWSON (MISCELLANEOUS) ×2 IMPLANT
SEALER BIPOLAR AQUA 6.0 (INSTRUMENTS) IMPLANT
SET HNDPC FAN SPRY TIP SCT (DISPOSABLE) ×2 IMPLANT
SLEEVE CABLE 2MM VT (Orthopedic Implant) IMPLANT
SOLUTION IRRIG SURGIPHOR (IV SOLUTION) IMPLANT
SOLUTION PRONTOSAN WOUND 350ML (IRRIGATION / IRRIGATOR) IMPLANT
SPIKE FLUID TRANSFER (MISCELLANEOUS) ×6 IMPLANT
SPONGE T-LAP 18X18 ~~LOC~~+RFID (SPONGE) IMPLANT
STEM FEM MOD RESTORE 155X19 (Stem) IMPLANT
SUCTION TUBE FRAZIER 12FR DISP (SUCTIONS) ×2 IMPLANT
SUT BONE WAX W31G (SUTURE) IMPLANT
SUT ETHIBOND #5 BRAIDED 30INL (SUTURE) ×2 IMPLANT
SUT ETHILON 3 0 PS 1 (SUTURE) IMPLANT
SUT MNCRL AB 3-0 PS2 18 (SUTURE) ×2 IMPLANT
SUT MNCRL AB 4-0 PS2 18 (SUTURE) IMPLANT
SUT STRATAFIX 14 PDO 48 VLT (SUTURE) ×2 IMPLANT
SUT VIC AB 0 CT1 36 (SUTURE) ×2 IMPLANT
SUT VIC AB 2-0 CT2 27 (SUTURE) ×4 IMPLANT
SUTURE STRATFX 0 PDS 27 VIOLET (SUTURE) ×2 IMPLANT
SYR 30ML LL (SYRINGE) ×4 IMPLANT
SYR 50ML LL SCALE MARK (SYRINGE) ×2 IMPLANT
SYSTEM REST MOD HIP SZ23+0 V40 (Hips) IMPLANT
TOWEL GREEN STERILE FF (TOWEL DISPOSABLE) ×2 IMPLANT
TOWEL OR 17X26 10 PK STRL BLUE (TOWEL DISPOSABLE) ×2 IMPLANT
TRAY FOLEY MTR SLVR 16FR STAT (SET/KITS/TRAYS/PACK) ×2 IMPLANT
TRAY FOLEY W/BAG SLVR 14FR LF (SET/KITS/TRAYS/PACK) IMPLANT
TUBE SUCTION HIGH CAP CLEAR NV (SUCTIONS) ×2 IMPLANT
UNDERPAD 30X36 HEAVY ABSORB (UNDERPADS AND DIAPERS) ×2 IMPLANT
WATER STERILE IRR 1000ML POUR (IV SOLUTION) ×4 IMPLANT

## 2023-08-16 NOTE — Interval H&P Note (Signed)
 The patient has been re-examined, and the chart reviewed, and there have been no interval changes to the documented history and physical.    Plan for Left femur revision for aseptic loosening femoral component, workup for infection negative  The operative side was examined and the patient was confirmed to have sensation to DPN, SPN, TN intact, Motor EHL, ext, flex 5/5, and DP 2+, PT 2+, No significant edema.   The risks, benefits, and alternatives have been discussed at length with patient, and the patient is willing to proceed.  Left hip marked. Consent has been signed.

## 2023-08-16 NOTE — Discharge Instructions (Addendum)
 INSTRUCTIONS AFTER JOINT REPLACEMENT   Remove items at home which could result in a fall. This includes throw rugs or furniture in walking pathways ICE to the affected joint every three hours while awake for 30 minutes at a time, for at least the first 3-5 days, and then as needed for pain and swelling.  Continue to use ice for pain and swelling. You may notice swelling that will progress down to the foot and ankle.  This is normal after surgery.  Elevate your leg when you are not up walking on it.   Continue to use the breathing machine you got in the hospital (incentive spirometer) which will help keep your temperature down.  It is common for your temperature to cycle up and down following surgery, especially at night when you are not up moving around and exerting yourself.  The breathing machine keeps your lungs expanded and your temperature down.  DIET:  As you were doing prior to hospitalization, we recommend a well-balanced diet.  DRESSING / WOUND CARE / SHOWERING:  Keep the surgical dressing until follow up.  The dressing is water  proof, so you can shower without any extra covering.  IF THE DRESSING FALLS OFF or the wound gets wet inside, change the dressing with sterile gauze.  Please use good hand washing techniques before changing the dressing.  Do not use any lotions or creams on the incision until instructed by your surgeon.    ACTIVITY  Increase activity slowly as tolerated, but follow the weight bearing instructions below.   No driving for 6 weeks or until further direction given by your physician.  You cannot drive while taking narcotics.  No lifting or carrying greater than 10 lbs. until further directed by your surgeon. Avoid periods of inactivity such as sitting longer than an hour when not asleep. This helps prevent blood clots.  You may return to work once you are authorized by your doctor.   WEIGHT BEARING: You may bear 50% weight on your surgical leg, and must use the walker  for ambulation assistance AT ALL TIMES for the next 6 weeks.    EXERCISES  Results after joint replacement surgery are often greatly improved when you follow the exercise, range of motion and muscle strengthening exercises prescribed by your doctor. Safety measures are also important to protect the joint from further injury. Any time any of these exercises cause you to have increased pain or swelling, decrease what you are doing until you are comfortable again and then slowly increase them. If you have problems or questions, call your caregiver or physical therapist for advice.   Rehabilitation is important following a joint replacement. After just a few days of immobilization, the muscles of the leg can become weakened and shrink (atrophy).  These exercises are designed to build up the tone and strength of the thigh and leg muscles and to improve motion. Often times heat used for twenty to thirty minutes before working out will loosen up your tissues and help with improving the range of motion but do not use heat for the first two weeks following surgery (sometimes heat can increase post-operative swelling).   These exercises can be done on a training (exercise) mat, on the floor, on a table or on a bed. Use whatever works the best and is most comfortable for you.    Use music or television while you are exercising so that the exercises are a pleasant break in your day. This will make your life better  with the exercises acting as a break in your routine that you can look forward to.   Perform all exercises about fifteen times, three times per day or as directed.  You should exercise both the operative leg and the other leg as well.  Exercises include:   Quad Sets - Tighten up the muscle on the front of the thigh (Quad) and hold for 5-10 seconds.   Straight Leg Raises - With your knee straight (if you were given a brace, keep it on), lift the leg to 60 degrees, hold for 3 seconds, and slowly lower the  leg.  Perform this exercise against resistance later as your leg gets stronger.  Leg Slides: Lying on your back, slowly slide your foot toward your buttocks, bending your knee up off the floor (only go as far as is comfortable). Then slowly slide your foot back down until your leg is flat on the floor again.  Angel Wings: Lying on your back spread your legs to the side as far apart as you can without causing discomfort.  Hamstring Strength:  Lying on your back, push your heel against the floor with your leg straight by tightening up the muscles of your buttocks.  Repeat, but this time bend your knee to a comfortable angle, and push your heel against the floor.  You may put a pillow under the heel to make it more comfortable if necessary.   A rehabilitation program following joint replacement surgery can speed recovery and prevent re-injury in the future due to weakened muscles. Contact your doctor or a physical therapist for more information on knee rehabilitation.   CONSTIPATION:  Constipation is defined medically as fewer than three stools per week and severe constipation as less than one stool per week.  Even if you have a regular bowel pattern at home, your normal regimen is likely to be disrupted due to multiple reasons following surgery.  Combination of anesthesia, postoperative narcotics, change in appetite and fluid intake all can affect your bowels.   YOU MUST use at least one of the following options; they are listed in order of increasing strength to get the job done.  They are all available over the counter, and you may need to use some, POSSIBLY even all of these options:    Drink plenty of fluids (prune juice may be helpful) and high fiber foods Colace 100 mg by mouth twice a day  Senokot for constipation as directed and as needed Dulcolax (bisacodyl), take with full glass of water   Miralax  (polyethylene glycol) once or twice a day as needed.  If you have tried all these things and are  unable to have a bowel movement in the first 3-4 days after surgery call either your surgeon or your primary doctor.    If you experience loose stools or diarrhea, hold the medications until you stool forms back up.  If your symptoms do not get better within 1 week or if they get worse, check with your doctor.  If you experience the worst abdominal pain ever or develop nausea or vomiting, please contact the office immediately for further recommendations for treatment.  ITCHING:  If you experience itching with your medications, try taking only a single pain pill, or even half a pain pill at a time.  You can also use Benadryl  over the counter for itching or also to help with sleep.   TED HOSE STOCKINGS:  Use stockings on both legs until for at least 2 weeks or as  directed by physician office. They may be removed at night for sleeping.  MEDICATIONS:  See your medication summary on the "After Visit Summary" that nursing will review with you.  You may have some home medications which will be placed on hold until you complete the course of blood thinner medication.  It is important for you to complete the blood thinner medication as prescribed.  Blood clot prevention (DVT Prophylaxis): After surgery you are at an increased risk for a blood clot.  You were prescribed a blood thinner, Aspirin  81mg , to be taken twice daily for a total of 4 weeks from surgery to help reduce your risk of getting a blood clot.  Signs of a pulmonary embolus (blood clot in the lungs) include sudden short of breath, feeling lightheaded or dizzy, chest pain with a deep breath, rapid pulse rapid breathing.  Signs of a blood clot in your arms or legs include new unexplained swelling and cramping, warm, red or darkened skin around the painful area.  Please call the office or 911 right away if these signs or symptoms develop.  PRECAUTIONS:   If you experience chest pain or shortness of breath - call 911 immediately for transfer to the  hospital emergency department.   If you develop a fever greater that 101 F, purulent drainage from wound, increased redness or drainage from wound, foul odor from the wound/dressing, or calf pain - CONTACT YOUR SURGEON.                                                   FOLLOW-UP APPOINTMENTS:  If you do not already have a post-op appointment, please call the office for an appointment to be seen by your surgeon.  Guidelines for how soon to be seen are listed in your "After Visit Summary", but are typically between 2-3 weeks after surgery.  If you have a specialized bandage, you may be told to follow up 1 week after surgery.  POST-OPERATIVE OPIOID TAPER INSTRUCTIONS: It is important to wean off of your opioid medication as soon as possible. If you do not need pain medication after your surgery it is ok to stop day one. Opioids include: Codeine, Hydrocodone (Norco, Vicodin), Oxycodone (Percocet, oxycontin ) and hydromorphone  amongst others.  Long term and even short term use of opiods can cause: Increased pain response Dependence Constipation Depression Respiratory depression And more.  Withdrawal symptoms can include Flu like symptoms Nausea, vomiting And more Techniques to manage these symptoms Hydrate well Eat regular healthy meals Stay active Use relaxation techniques(deep breathing, meditating, yoga) Do Not substitute Alcohol  to help with tapering If you have been on opioids for less than two weeks and do not have pain than it is ok to stop all together.  Plan to wean off of opioids This plan should start within one week post op of your joint replacement. Maintain the same interval or time between taking each dose and first decrease the dose.  Cut the total daily intake of opioids by one tablet each day Next start to increase the time between doses. The last dose that should be eliminated is the evening dose.   MAKE SURE YOU:  Understand these instructions.  Get help right away if  you are not doing well or get worse.    Thank you for letting us  be a part of your medical care team.  It is a privilege we respect greatly.  We hope these instructions will help you stay on track for a fast and full recovery!

## 2023-08-16 NOTE — Transfer of Care (Signed)
 Immediate Anesthesia Transfer of Care Note  Patient: Laurie Bridges  Procedure(s) Performed: REVISION, ARTHROPLASTY, HIP (Left: Hip) APPLICATION, WOUND VAC (Left)  Patient Location: PACU  Anesthesia Type:Spinal  Level of Consciousness: drowsy  Airway & Oxygen Therapy: Patient Spontanous Breathing and Patient connected to nasal cannula oxygen  Post-op Assessment: Report given to RN and Post -op Vital signs reviewed and stable  Post vital signs: Reviewed and stable  Last Vitals:  Vitals Value Taken Time  BP 121/66 08/16/23 13:22  Temp    Pulse 69 08/16/23 13:25  Resp 14 08/16/23 13:25  SpO2 100 % 08/16/23 13:25  Vitals shown include unfiled device data.  Last Pain:  Vitals:   08/16/23 0755  TempSrc: Oral         Complications: No notable events documented.

## 2023-08-16 NOTE — Anesthesia Procedure Notes (Signed)
 Procedure Name: MAC Date/Time: 08/16/2023 10:36 AM  Performed by: Landy Chip HERO, CRNAPre-anesthesia Checklist: Patient identified, Emergency Drugs available, Suction available, Patient being monitored and Timeout performed Patient Re-evaluated:Patient Re-evaluated prior to induction Oxygen Delivery Method: Nasal cannula Preoxygenation: Pre-oxygenation with 100% oxygen Induction Type: IV induction Placement Confirmation: positive ETCO2 Dental Injury: Teeth and Oropharynx as per pre-operative assessment

## 2023-08-16 NOTE — Op Note (Signed)
 08/16/2023  12:54 PM  PATIENT:  Laurie Bridges   MRN: [id number]  PRE-OPERATIVE DIAGNOSIS: Aseptic loosening left hip femoral component  POST-OPERATIVE DIAGNOSIS:  same  PROCEDURE: Revision left hip femoral component  PREOPERATIVE INDICATIONS:    VENORA Bridges is an 78 y.o. female who has a diagnosis of Aseptic loosening left hip femoral component and elected for surgical management after failing conservative treatment.  The risks benefits and alternatives were discussed with the patient including but not limited to the risks of nonoperative treatment, versus surgical intervention including infection, bleeding, nerve injury, periprosthetic fracture, the need for revision surgery, dislocation, leg length discrepancy, blood clots, cardiopulmonary complications, morbidity, mortality, among others, and they were willing to proceed.     OPERATIVE REPORT     SURGEON:  Toribio Higashi, MD    ASSISTANT: Bernarda Mclean, PA-C, (Present throughout the entire procedure,  necessary for completion of procedure in a timely manner, assisting with retraction, instrumentation, and closure)     ANESTHESIA: Spinal  ESTIMATED BLOOD LOSS: 300cc    COMPLICATIONS:  None.     UNIQUE ASPECTS OF THE CASE: Successful removal of left hip femoral component with essentially no on growth, no evidence of IntraOp infection well-positioned and well-fixed with acetabular component and good hip stability  COMPONENTS:   [employer] restoration modular 155 x 19 mm diaphyseal stem, 23 mm +0 mm proximal body, 36+0 ceramic head Implant Name Type Inv. Item Serial No. Manufacturer Lot No. LRB No. Used Action  SLEEVE CABLE VT - [id number] Orthopedic Implant SLEEVE CABLE VT  [employer] [id number] Left 1 Implanted  STEM FEM MOD RESTORE [id number] - [id number] Stem STEM FEM MOD RESTORE [id number]  [employer] [id number] Left 1 Implanted  SYSTEM REST MOD HIP SZ23+0 V40 - [id number] Hips SYSTEM REST MOD  HIP SZ23+0 V40  [employer] [id number] Left 1 Implanted  HEAD CERAMIC FEMORAL - [id number] Head HEAD CERAMIC FEMORAL  [employer] [id number] Left 1 Implanted    The aquamantis was utilized for this case to help facilitate better hemostasis as patient was felt to be at increased risk of bleeding because of complex case requiring increased OR time and/or exposure.       PROCEDURE IN DETAIL:   The patient was met in the holding area and  identified.  The appropriate hip was identified and marked at the operative site.  The patient was then transported to the OR  and  placed under anesthesia.  At that point, the patient was  placed in the lateral decubitus position with the operative side up and  secured to the operating room table  and all bony prominences padded. A subaxillary role was also placed.    The operative lower extremity was prepped from the iliac crest to the distal leg.  Sterile draping was performed.  Preoperative antibiotics, 2 gm of ancef ,1 gm of Tranexamic Acid , and 8 mg of Decadron  administered. Time out was performed prior to incision.      A routine posterolateral approach was utilized via sharp dissection  carried down to the subcutaneous tissue.  Gross bleeders were Bovie coagulated.  The iliotibial band was identified and incised along the length of the skin incision through the glute max fascia.  Charnley retractor was placed with care to protect the sciatic nerve posteriorly.  With the hip internally rotated, the piriformis tendon was identified and released from the femoral insertion and tagged with a #5 Ethibond.  A capsulotomy was then performed off the  femoral insertion and also tagged with a #5 Ethibond.  We also exposed just subvastus and placed a prophylactic cerclage cable around the proximal femur just below the lesser trochanter.  The femoral neck was exposed, and I removed the femoral head with a bone tamp.  We then used a high-speed bur to clean  up the proximal bone around the femoral component.  Notably as we were working with the bur there was some rotational instability of the femoral component.  Use exodus placed to clear out any possible attachments anterior, posterior, and lateral.  Care was taken to protect the greater trochanter to avoid fracture.  The femoral component was easily extracted with the back slap.  We then utilized a drill bit and reamers to open up the canal through the pedestal distally.  Fluoroscopic thick images were taken intermittently to it check for appropriate position and fill of the reamers for the distal diaphyseal engaging stem.  We reamed up sequentially to a 19 mm reamer with 155 mm stem length.  The real diaphyseal stem was impacted into place and had excellent axial and rotational stability.      I then exposed the deep acetabulum, and felt that the acetabular component and liner were well-positioned, well-fixed, and without damage.  Elected to retain the current acetabular system.    Next we turned our attention to reaming of the proximal body.  We reamed up sequentially up to the 23 proximal body as templated.  A +0 head ball was utilized.  This had excellent restoration of leg lengths and good stability.   There was no impingement with full extension and 90 degrees external rotation.  The hip was stable at the position of sleep and with 90 degrees flexion and 90 degrees of internal rotation.  Leg lengths were also clinically assessed in the lateral position and felt to be equal. Intra-Op flatplate was obtained and confirmed appropriate component positions.    A final femoral prosthesis size 23+0 was selected. I then impacted the real femoral prosthesis into place.I again trialed and selected a 36+ 0mm ball. The hip was then reduced and taken through a range of motion. There was no impingement with full extension and 90 degrees external rotation.  The hip was stable at the position of sleep and with 90  degrees flexion and 90 degrees of internal rotation. Leg lengths were  again assessed and felt to be restored.  We then opened, and I impacted the real head ball into place.  The posterior capsule was then closed with #5 Ethibond.  The piriformis was repaired through the base of the abductor tendon using a [place] suture passer.  I then irrigated the hip copiously with dilute Betadine  and with normal saline pulse lavage. Periarticular injection was then performed with Exparel .   We repaired the fascia #1 barbed suture, followed by 0 barbed suture for the subcutaneous fat.  Skin was closed with 2-0 Vicryl and 3-0 Monocryl.  Dermabond and Aquacel dressing were applied. The patient was then awakened and returned to PACU in stable and satisfactory condition.  Leg lengths in the supine position were assessed and felt to be clinically equal. There were no complications.  Post op recs: WB: 50% partial weightbearing and posterior hip precautions x 6 weeks Abx: ancef  in-house and discharged on cefadroxil  500 mg twice daily x 7 days, follow-up IntraOp cultures Imaging: PACU pelvis Xray Dressing: Aquacell, keep intact until follow up DVT prophylaxis: Aspirin  81BID starting POD1 Follow up: 2 weeks after  surgery for a wound check with Dr. Edna at [facility].  Address: [address], [place], [place] [address]  Office Phone: [phone]   Toribio Edna, MD Orthopedic Surgeon

## 2023-08-16 NOTE — Anesthesia Procedure Notes (Signed)
 Date/Time: 08/16/2023 10:55 AM  Performed by: Landy Chip HERO, CRNAOxygen Delivery Method: Simple face mask Placement Confirmation: positive ETCO2 Dental Injury: Teeth and Oropharynx as per pre-operative assessment

## 2023-08-16 NOTE — Plan of Care (Signed)
 Problem: Education: Goal: Knowledge of General Education information will improve Description: Including pain rating scale, medication(s)/side effects and non-pharmacologic comfort measures Outcome: Progressing   Problem: Clinical Measurements: Goal: Ability to maintain clinical measurements within normal limits will improve Outcome: Progressing   Problem: Activity: Goal: Risk for activity intolerance will decrease Outcome: Progressing   Problem: Coping: Goal: Level of anxiety will decrease Outcome: Progressing   Problem: Pain Managment: Goal: General experience of comfort will improve and/or be controlled Outcome: Progressing   Jon LULLA Reins, RN 08/16/23 6:47 PM

## 2023-08-16 NOTE — Evaluation (Addendum)
 Physical Therapy Evaluation Patient Details Name: Laurie Bridges MRN: [id number] DOB: 1945/05/21 Today's Date: 08/16/2023  History of Present Illness  78 yo female presents to therapy s/p L THA revision secondary to aseptic loosening of L femoral component and failure of conservative management. Pt is currently LLE 50% WB and posterior hip precautions x 6 wks. Pt PMH includes but is not limited to:  abdominal pain, gastritis, concha bullosa, HLD, GERD, CKD, OSA, dysphagia, anemia, anxiety, CAD, arthritis, back surgery, and L THA.  Clinical Impression    Laurie Bridges is a 78 y.o. female POD 0 s/p L THA revision. Patient reports mod I with mobility at baseline. Patient is now limited by functional impairments (see PT problem list below) and requires min A for bed mobility and CGA and cues for transfers. Patient was able to ambulate 40 feet with RW and CGA level of assist. Patient instructed in exercise to facilitate ROM and circulation to manage edema. Patient will benefit from continued skilled PT interventions to address impairments and progress towards PLOF. Acute PT will follow to progress mobility and stair training in preparation for safe discharge home with family support and [facility] services. PT initiated pt and family ed on L posterior hip precautions and L LE 50% WB.        If plan is discharge home, recommend the following: A little help with walking and/or transfers;A little help with bathing/dressing/bathroom;Assistance with cooking/housework;Assist for transportation;Help with stairs or ramp for entrance   Can travel by private vehicle        Equipment Recommendations None recommended by PT  Recommendations for Other Services       Functional Status Assessment Patient has had a recent decline in their functional status and demonstrates the ability to make significant improvements in function in a reasonable and predictable amount of time.     Precautions / Restrictions  Precautions Precautions: Fall Precaution/Restrictions Comments: posterior hip precautions Restrictions Weight Bearing Restrictions Per Provider Order: Yes LLE Weight Bearing Per Provider Order: Partial weight bearing LLE Partial Weight Bearing Percentage or Pounds: 50%      Mobility  Bed Mobility Overal bed mobility: Needs Assistance Bed Mobility: Supine to Sit     Supine to sit: Min assist     General bed mobility comments: cues and min A for L LE to EOB    Transfers Overall transfer level: Needs assistance Equipment used: Rolling walker (2 wheels) Transfers: Sit to/from Stand Sit to Stand: Contact guard assist           General transfer comment: min cues pt able to push to stand with one UE    Ambulation/Gait Ambulation/Gait assistance: Contact guard assist Gait Distance (Feet): 40 Feet Assistive device: Rolling walker (2 wheels) Gait Pattern/deviations: Step-to pattern, Antalgic, Decreased stance time - left, Trunk flexed Gait velocity: decreased     General Gait Details: slight trunk flexion, heavy reliance on B UE support to offload L LE in stance phase to maintain 50% WB restriction, step to pattern and recliner close  Stairs            Wheelchair Mobility     Tilt Bed    Modified Rankin (Stroke Patients Only)       Balance Overall balance assessment: Needs assistance Sitting-balance support: Feet supported Sitting balance-Leahy Scale: Good     Standing balance support: Bilateral upper extremity supported, During functional activity, Reliant on assistive device for balance Standing balance-Leahy Scale: Poor  Pertinent Vitals/Pain Pain Assessment Pain Assessment: 0-10 Pain Score: 4  Pain Location: L LE and hip Pain Descriptors / Indicators: Aching, Constant, Discomfort, Dull, Grimacing, Operative site guarding Pain Intervention(s): Limited activity within patient's tolerance, Monitored during  session, Premedicated before session, Repositioned, Ice applied    Home Living Family/patient expects to be discharged to:: Private residence Living Arrangements: Alone Available Help at Discharge: Family Type of Home: House Home Access: Stairs to enter Entrance Stairs-Rails: Left Entrance Stairs-Number of Steps: 3   Home Layout: One level Home Equipment: Agricultural consultant (2 wheels);Cane - single point;Toilet riser;Shower seat      Prior Function Prior Level of Function : Independent/Modified Independent;Driving             Mobility Comments: mod I with use of SPC for all ADLs, self care tasks and IADLs       Extremity/Trunk Assessment        Lower Extremity Assessment Lower Extremity Assessment: LLE deficits/detail LLE Deficits / Details: ankle DF/PF 5/5 LLE Sensation: WNL    Cervical / Trunk Assessment Cervical / Trunk Assessment: Normal  Communication   Communication Communication: No apparent difficulties    Cognition Arousal: Alert Behavior During Therapy: WFL for tasks assessed/performed   PT - Cognitive impairments: No apparent impairments                         Following commands: Intact       Cueing       General Comments      Exercises Total Joint Exercises Ankle Circles/Pumps: AROM, Both, 10 reps   Assessment/Plan    PT Assessment Patient needs continued PT services  PT Problem List Decreased strength;Decreased range of motion;Decreased activity tolerance;Decreased balance;Decreased mobility;Decreased coordination;Pain       PT Treatment Interventions DME instruction;Gait training;Stair training;Functional mobility training;Therapeutic activities;Balance training;Therapeutic exercise;Neuromuscular re-education;Patient/family education;Modalities    PT Goals (Current goals can be found in the Care Plan section)  Acute Rehab PT Goals Patient Stated Goal: to have the L hip right and no pain PT Goal Formulation: With  patient Time For Goal Achievement: 08/30/23 Potential to Achieve Goals: Good    Frequency 7X/week     Co-evaluation               AM-PAC PT 6 Clicks Mobility  Outcome Measure Help needed turning from your back to your side while in a flat bed without using bedrails?: A Little Help needed moving from lying on your back to sitting on the side of a flat bed without using bedrails?: A Little Help needed moving to and from a bed to a chair (including a wheelchair)?: A Little Help needed standing up from a chair using your arms (e.g., wheelchair or bedside chair)?: A Little Help needed to walk in hospital room?: A Little Help needed climbing 3-5 steps with a railing? : Total 6 Click Score: 16    End of Session Equipment Utilized During Treatment: Gait belt Activity Tolerance: Patient tolerated treatment well;No increased pain Patient left: in chair;with call bell/phone within reach;with family/visitor present Nurse Communication: Mobility status;Weight bearing status PT Visit Diagnosis: Unsteadiness on feet (R26.81);Other abnormalities of gait and mobility (R26.89);Muscle weakness (generalized) (M62.81);Difficulty in walking, not elsewhere classified (R26.2);Pain Pain - Right/Left: Left Pain - part of body: Leg;Hip    Time: 8193-8159 PT Time Calculation (min) (ACUTE ONLY): 34 min   Charges:   PT Evaluation $PT Eval Low Complexity: 1 Low PT Treatments $Therapeutic Activity: 8-22 mins  PT General Charges $$ ACUTE PT VISIT: 1 Visit        [place], PT Acute Rehab    [place] VEAR Drone 08/16/2023, 7:30 PM

## 2023-08-16 NOTE — Anesthesia Procedure Notes (Signed)
 Spinal  Patient location during procedure: OR Start time: 08/16/2023 10:40 AM End time: 08/16/2023 10:45 AM Reason for block: surgical anesthesia Staffing Performed: anesthesiologist  Anesthesiologist: Patrisha Bernardino SQUIBB, MD Performed by: Patrisha Bernardino SQUIBB, MD Authorized by: Patrisha Bernardino SQUIBB, MD   Preanesthetic Checklist Completed: patient identified, IV checked, risks and benefits discussed, surgical consent, monitors and equipment checked, pre-op evaluation and timeout performed Spinal Block Patient position: sitting Prep: DuraPrep Patient monitoring: cardiac monitor, continuous pulse ox and blood pressure Approach: midline Location: L3-4 Injection technique: single-shot Needle Needle type: Pencan  Needle gauge: 24 G Needle length: 9 cm Assessment Sensory level: T10 Events: CSF return Additional Notes Functioning IV was confirmed and monitors were applied. Sterile prep and drape, including hand hygiene and sterile gloves were used. The patient was positioned and the spine was prepped. The skin was anesthetized with lidocaine .  Free flow of clear CSF was obtained prior to injecting local anesthetic into the CSF.  The spinal needle aspirated freely following injection.  The needle was carefully withdrawn.  The patient tolerated the procedure well.

## 2023-08-17 ENCOUNTER — Encounter (HOSPITAL_COMMUNITY): Payer: Self-pay | Admitting: Orthopedic Surgery

## 2023-08-17 LAB — CBC
HCT: 33.4 % — ABNORMAL LOW (ref 36.0–46.0)
Hemoglobin: 11 g/dL — ABNORMAL LOW (ref 12.0–15.0)
MCH: 30 pg (ref 26.0–34.0)
MCHC: 32.9 g/dL (ref 30.0–36.0)
MCV: 91 fL (ref 80.0–100.0)
Platelets: 213 K/uL (ref 150–400)
RBC: 3.67 MIL/uL — ABNORMAL LOW (ref 3.87–5.11)
RDW: 12.7 % (ref 11.5–15.5)
WBC: 13 K/uL — ABNORMAL HIGH (ref 4.0–10.5)
nRBC: 0 % (ref 0.0–0.2)

## 2023-08-17 LAB — BASIC METABOLIC PANEL WITH GFR
Anion gap: 10 (ref 5–15)
BUN: 15 mg/dL (ref 8–23)
CO2: 22 mmol/L (ref 22–32)
Calcium: 9.5 mg/dL (ref 8.9–10.3)
Chloride: 108 mmol/L (ref 98–111)
Creatinine, Ser: 0.85 mg/dL (ref 0.44–1.00)
GFR, Estimated: >60 mL/min (ref >60)
Glucose, Bld: 127 mg/dL — ABNORMAL HIGH (ref 70–99)
Potassium: 4.3 mmol/L (ref 3.5–5.1)
Sodium: 140 mmol/L (ref 135–145)

## 2023-08-17 NOTE — TOC Transition Note (Signed)
 Transition of Care [facility]) - Discharge Note   Patient Details  Name: Laurie Bridges MRN: [id number] Date of Birth: [birth year]  [facility] CM/SW Contact:  Alfonse JONELLE Rex, RN Phone Number: 08/17/2023, 8:15 AM   Clinical Narrative:  Met with patient at bedside to review dc therapy and home equipment needs, pt confirmed HEP, has RW at home, no home DME needs. No TOC needs.      Final next level of care: Home/Self Care Barriers to Discharge: No Barriers Identified   Patient Goals and CMS Choice Patient states their goals for this hospitalization and ongoing recovery are:: return home          Discharge Placement                       Discharge Plan and Services Additional resources added to the After Visit Summary for                                       Social Drivers of Health (SDOH) Interventions SDOH Screenings   Food Insecurity: No Food Insecurity (08/16/2023)  Housing: Low Risk  (08/16/2023)  Transportation Needs: No Transportation Needs (08/16/2023)  Utilities: Not At Risk (08/16/2023)  Social Connections: Moderately Integrated (08/16/2023)  Tobacco Use: Low Risk  (08/16/2023)     Readmission Risk Interventions    08/17/2023    8:14 AM  Readmission Risk Prevention Plan  Post Dischage Appt Complete  Medication Screening Complete  Transportation Screening Complete

## 2023-08-17 NOTE — Discharge Summary (Signed)
 Physician Discharge Summary  Patient ID: Laurie Bridges MRN: [id number] DOB/AGE: 1946-01-09 78 y.o.  Admit date: 08/16/2023 Discharge date: 08/17/2023  Admission Diagnoses:  Failed total hip arthroplasty [facility])  Discharge Diagnoses:  Principal Problem:   Failed total hip arthroplasty [facility])   Past Medical History:  Diagnosis Date   Anemia    hx of Fe def many years ago   Anxiety    Arthritis    back   CAD (coronary artery disease)    a. s/p Coronary CT in 06/2018 showing mild stenosis along the mid-LAD, mid-LCx and mid-RCA   Cancer (HCC)    carcinoma removed on head 2024   Chronic kidney disease    GERD (gastroesophageal reflux disease)    Hypertension    no meds in several years   PONV (postoperative nausea and vomiting)    Pre-diabetes     Surgeries: Procedure(s): REVISION, ARTHROPLASTY, HIP APPLICATION, WOUND VAC on 08/16/2023   Consultants (if any):   Discharged Condition: Improved  Hospital Course: Laurie Bridges is an 78 y.o. female who was admitted 08/16/2023 with a diagnosis of Failed total hip arthroplasty (HCC) and went to the operating room on 08/16/2023 and underwent the above named procedures.    She was given perioperative antibiotics:  Anti-infectives (From admission, onward)    Start     Dose/Rate Route Frequency Ordered Stop   08/16/23 1900  ceFAZolin  (ANCEF ) IVPB 2g/100 mL premix        2 g 200 mL/hr over 30 Minutes Intravenous Every 8 hours 08/16/23 1528 08/19/23 2159   08/16/23 0815  ceFAZolin  (ANCEF ) IVPB 2g/100 mL premix        2 g 200 mL/hr over 30 Minutes Intravenous On call to O.R. 08/16/23 0802 08/16/23 1113   08/16/23 0000  cefadroxil  (DURICEF) 500 MG capsule        500 mg Oral 2 times daily 08/16/23 1358 08/23/23 2359     .  She was given sequential compression devices, early ambulation, and Aspirin  for DVT prophylaxis.  She benefited maximally from the hospital stay and there were no complications.    Recent vital signs:   Vitals:   08/17/23 0308 08/17/23 0535  BP: 111/64 (!) 101/54  Pulse: 62 69  Resp: 16 18  Temp: 98.4 F (36.9 C) 98.8 F (37.1 C)  SpO2: 98% 98%    Recent laboratory studies:  Lab Results  Component Value Date   HGB 11.0 (L) 08/17/2023   HGB 13.8 08/04/2023   HGB 13.7 12/25/2022   Lab Results  Component Value Date   WBC 13.0 (H) 08/17/2023   PLT 213 08/17/2023   No results found for: INR Lab Results  Component Value Date   NA 140 08/17/2023   K 4.3 08/17/2023   CL 108 08/17/2023   CO2 22 08/17/2023   BUN 15 08/17/2023   CREATININE 0.85 08/17/2023   GLUCOSE 127 (H) 08/17/2023    Discharge Medications:   Allergies as of 08/17/2023       Reactions   Codeine Other (See Comments)   Syncope    Demerol  Rash   Elemental Sulfur  Rash   Feldene [piroxicam] Rash   Latex Rash   Pyridium [phenazopyridine Hcl] Itching, Rash        Medication List     TAKE these medications    acetaminophen  500 MG tablet Commonly known as: TYLENOL  Take 2 tablets (1,000 mg total) by mouth every 8 (eight) hours as needed.   aspirin  EC 81 MG  tablet Take 1 tablet (81 mg total) by mouth 2 (two) times daily for 28 days. Swallow whole. What changed:  when to take this additional instructions   cefadroxil  500 MG capsule Commonly known as: DURICEF Take 1 capsule (500 mg total) by mouth 2 (two) times daily for 7 days.   celecoxib  100 MG capsule Commonly known as: CeleBREX  Take 1 capsule (100 mg total) by mouth 2 (two) times daily for 14 days.   cetirizine 10 MG tablet Commonly known as: ZYRTEC Take 10 mg by mouth daily.   ciclopirox 0.77 % cream Commonly known as: LOPROX Apply 1 Application topically 2 (two) times daily. Toe   CINNAMON PO Take 120 mg by mouth daily. Apple cider vinegar   dexlansoprazole  60 MG capsule Commonly known as: DEXILANT  TAKE (1) CAPSULE BY MOUTH ONCE DAILY.   fluticasone  50 MCG/ACT nasal spray Commonly known as: FLONASE  Place 2 sprays into both  nostrils 2 (two) times daily.   LORazepam 1 MG tablet Commonly known as: ATIVAN Take 1 mg by mouth at bedtime.   methocarbamol  500 MG tablet Commonly known as: ROBAXIN  Take 1 tablet (500 mg total) by mouth every 8 (eight) hours as needed for up to 10 days for muscle spasms.   MIRALAX  PO Take 1 Capful by mouth daily as needed (Constipation).   MUCINEX  PO Take 1,200 mg by mouth daily as needed (Congestion).   multivitamin tablet Take 1 tablet by mouth daily. One a Day   ondansetron  4 MG tablet Commonly known as: Zofran  Take 1 tablet (4 mg total) by mouth every 8 (eight) hours as needed for up to 14 days for nausea or vomiting. What changed: See the new instructions.   oxyCODONE  5 MG immediate release tablet Commonly known as: Roxicodone  Take 0.5-1 tablets (2.5-5 mg total) by mouth every 6 (six) hours as needed for up to 7 days for severe pain (pain score 7-10) or moderate pain (pain score 4-6).   Repatha  SureClick 140 MG/ML Soaj Generic drug: Evolocumab  Inject 140 mg into the skin every 14 (fourteen) days.   rosuvastatin  20 MG tablet Commonly known as: CRESTOR  Take 1 tablet 3 times per week ( M,W,F)   urea  40 % Crea Commonly known as: CARMOL Apply 1 application  topically 2 (two) times daily. Toes   Vitamin D3 50 MCG (2000 UT) capsule Take 2,000 Units by mouth daily.               Discharge Care Instructions  (From admission, onward)           Start     Ordered   08/16/23 0000  Partial weight bearing       Comments: 6 weeks; use walker at all times for ambulation  Question Answer Comment  % Body Weight 50   Laterality left   Extremity Lower      08/16/23 1505            Diagnostic Studies: DG HIP UNILAT W OR W/O PELVIS 2-3 VIEWS LEFT Result Date: 08/16/2023 CLINICAL DATA:  Status post left hip arthroplasty. EXAM: DG HIP (WITH OR WITHOUT PELVIS) 2-3V LEFT COMPARISON:  MRI 07/06/2023 FINDINGS: Revision left hip arthroplasty in expected alignment.  Cerclage wire about the femoral stem. No periprosthetic lucency or fracture. Recent postsurgical change includes air and edema in the soft tissues. IMPRESSION: Revision left hip arthroplasty without immediate postoperative complication. Electronically Signed   By: Andrea Gasman M.D.   On: 08/16/2023 14:32   DG HIP UNILAT WITH  PELVIS 2-3 VIEWS LEFT Result Date: 08/16/2023 CLINICAL DATA:  [id number] Surgery, elective [id number] EXAM: DG HIP (WITH OR WITHOUT PELVIS) 2-3V LEFT COMPARISON:  None Available. FINDINGS: Multiple intraoperative radiographs during various steps of hip arthroplasty were submitted. Please refer to the separately issued operative report for further details. Fluoroscopy time: 6 seconds Radiation dose: 2.48 mGy IMPRESSION: Intraoperative fluoroscopic guidance, as described above. Electronically Signed   By: Ree Molt M.D.   On: 08/16/2023 12:48   DG C-Arm 1-60 Min-No Report Result Date: 08/16/2023 Fluoroscopy was utilized by the requesting physician.  No radiographic interpretation.   DG C-Arm 1-60 Min-No Report Result Date: 08/16/2023 Fluoroscopy was utilized by the requesting physician.  No radiographic interpretation.    Disposition: Discharge disposition: 01-Home or Self Care       Discharge Instructions     Call MD / Call 911   Complete by: As directed    If you experience chest pain or shortness of breath, CALL 911 and be transported to the hospital emergency room.  If you develope a fever above 101 F, pus (white drainage) or increased drainage or redness at the wound, or calf pain, call your surgeon's office.   Constipation Prevention   Complete by: As directed    Drink plenty of fluids.  Prune juice may be helpful.  You may use a stool softener, such as Colace (over the counter) 100 mg twice a day.  Use MiraLax  (over the counter) for constipation as needed.   Diet - low sodium heart healthy   Complete by: As directed    Driving restrictions   Complete by: As  directed    No driving for 6 weeks   Follow the hip precautions as taught in Physical Therapy   Complete by: As directed    Increase activity slowly as tolerated   Complete by: As directed    Partial weight bearing   Complete by: As directed    6 weeks; use walker at all times for ambulation   % Body Weight: 50   Laterality: left   Extremity: Lower   Post-operative opioid taper instructions:   Complete by: As directed    POST-OPERATIVE OPIOID TAPER INSTRUCTIONS: It is important to wean off of your opioid medication as soon as possible. If you do not need pain medication after your surgery it is ok to stop day one. Opioids include: Codeine, Hydrocodone (Norco, Vicodin), Oxycodone (Percocet, oxycontin ) and hydromorphone  amongst others.  Long term and even short term use of opiods can cause: Increased pain response Dependence Constipation Depression Respiratory depression And more.  Withdrawal symptoms can include Flu like symptoms Nausea, vomiting And more Techniques to manage these symptoms Hydrate well Eat regular healthy meals Stay active Use relaxation techniques(deep breathing, meditating, yoga) Do Not substitute Alcohol  to help with tapering If you have been on opioids for less than two weeks and do not have pain than it is ok to stop all together.  Plan to wean off of opioids This plan should start within one week post op of your joint replacement. Maintain the same interval or time between taking each dose and first decrease the dose.  Cut the total daily intake of opioids by one tablet each day Next start to increase the time between doses. The last dose that should be eliminated is the evening dose.      TED hose   Complete by: As directed    Use stockings (TED hose) for 2 weeks on both leg(s).  Then for 2  more weeks on the surgical leg.  You may remove them at night for sleeping.        Follow-up Information     Edna Toribio LABOR, MD Follow up in 2  week(s).   Specialty: Orthopedic Surgery Contact information: [address] [place] [address] [phone]                    Discharge Instructions      INSTRUCTIONS AFTER JOINT REPLACEMENT   Remove items at home which could result in a fall. This includes throw rugs or furniture in walking pathways ICE to the affected joint every three hours while awake for 30 minutes at a time, for at least the first 3-5 days, and then as needed for pain and swelling.  Continue to use ice for pain and swelling. You may notice swelling that will progress down to the foot and ankle.  This is normal after surgery.  Elevate your leg when you are not up walking on it.   Continue to use the breathing machine you got in the hospital (incentive spirometer) which will help keep your temperature down.  It is common for your temperature to cycle up and down following surgery, especially at night when you are not up moving around and exerting yourself.  The breathing machine keeps your lungs expanded and your temperature down.  DIET:  As you were doing prior to hospitalization, we recommend a well-balanced diet.  DRESSING / WOUND CARE / SHOWERING:  Keep the surgical dressing until follow up.  The dressing is water  proof, so you can shower without any extra covering.  IF THE DRESSING FALLS OFF or the wound gets wet inside, change the dressing with sterile gauze.  Please use good hand washing techniques before changing the dressing.  Do not use any lotions or creams on the incision until instructed by your surgeon.    ACTIVITY  Increase activity slowly as tolerated, but follow the weight bearing instructions below.   No driving for 6 weeks or until further direction given by your physician.  You cannot drive while taking narcotics.  No lifting or carrying greater than 10 lbs. until further directed by your surgeon. Avoid periods of inactivity such as sitting longer than an hour when not  asleep. This helps prevent blood clots.  You may return to work once you are authorized by your doctor.   WEIGHT BEARING: You may bear 50% weight on your surgical leg, and must use the walker for ambulation assistance AT ALL TIMES for the next 6 weeks.    EXERCISES  Results after joint replacement surgery are often greatly improved when you follow the exercise, range of motion and muscle strengthening exercises prescribed by your doctor. Safety measures are also important to protect the joint from further injury. Any time any of these exercises cause you to have increased pain or swelling, decrease what you are doing until you are comfortable again and then slowly increase them. If you have problems or questions, call your caregiver or physical therapist for advice.   Rehabilitation is important following a joint replacement. After just a few days of immobilization, the muscles of the leg can become weakened and shrink (atrophy).  These exercises are designed to build up the tone and strength of the thigh and leg muscles and to improve motion. Often times heat used for twenty to thirty minutes before working out will loosen up your tissues and help with improving the range  of motion but do not use heat for the first two weeks following surgery (sometimes heat can increase post-operative swelling).   These exercises can be done on a training (exercise) mat, on the floor, on a table or on a bed. Use whatever works the best and is most comfortable for you.    Use music or television while you are exercising so that the exercises are a pleasant break in your day. This will make your life better with the exercises acting as a break in your routine that you can look forward to.   Perform all exercises about fifteen times, three times per day or as directed.  You should exercise both the operative leg and the other leg as well.  Exercises include:   Quad Sets - Tighten up the muscle on the front of the thigh  (Quad) and hold for 5-10 seconds.   Straight Leg Raises - With your knee straight (if you were given a brace, keep it on), lift the leg to 60 degrees, hold for 3 seconds, and slowly lower the leg.  Perform this exercise against resistance later as your leg gets stronger.  Leg Slides: Lying on your back, slowly slide your foot toward your buttocks, bending your knee up off the floor (only go as far as is comfortable). Then slowly slide your foot back down until your leg is flat on the floor again.  Angel Wings: Lying on your back spread your legs to the side as far apart as you can without causing discomfort.  Hamstring Strength:  Lying on your back, push your heel against the floor with your leg straight by tightening up the muscles of your buttocks.  Repeat, but this time bend your knee to a comfortable angle, and push your heel against the floor.  You may put a pillow under the heel to make it more comfortable if necessary.   A rehabilitation program following joint replacement surgery can speed recovery and prevent re-injury in the future due to weakened muscles. Contact your doctor or a physical therapist for more information on knee rehabilitation.   CONSTIPATION:  Constipation is defined medically as fewer than three stools per week and severe constipation as less than one stool per week.  Even if you have a regular bowel pattern at home, your normal regimen is likely to be disrupted due to multiple reasons following surgery.  Combination of anesthesia, postoperative narcotics, change in appetite and fluid intake all can affect your bowels.   YOU MUST use at least one of the following options; they are listed in order of increasing strength to get the job done.  They are all available over the counter, and you may need to use some, POSSIBLY even all of these options:    Drink plenty of fluids (prune juice may be helpful) and high fiber foods Colace 100 mg by mouth twice a day  Senokot for  constipation as directed and as needed Dulcolax (bisacodyl), take with full glass of water   Miralax  (polyethylene glycol) once or twice a day as needed.  If you have tried all these things and are unable to have a bowel movement in the first 3-4 days after surgery call either your surgeon or your primary doctor.    If you experience loose stools or diarrhea, hold the medications until you stool forms back up.  If your symptoms do not get better within 1 week or if they get worse, check with your doctor.  If you experience the  worst abdominal pain ever or develop nausea or vomiting, please contact the office immediately for further recommendations for treatment.  ITCHING:  If you experience itching with your medications, try taking only a single pain pill, or even half a pain pill at a time.  You can also use Benadryl  over the counter for itching or also to help with sleep.   TED HOSE STOCKINGS:  Use stockings on both legs until for at least 2 weeks or as directed by physician office. They may be removed at night for sleeping.  MEDICATIONS:  See your medication summary on the "After Visit Summary" that nursing will review with you.  You may have some home medications which will be placed on hold until you complete the course of blood thinner medication.  It is important for you to complete the blood thinner medication as prescribed.  Blood clot prevention (DVT Prophylaxis): After surgery you are at an increased risk for a blood clot.  You were prescribed a blood thinner, Aspirin  81mg , to be taken twice daily for a total of 4 weeks from surgery to help reduce your risk of getting a blood clot.  Signs of a pulmonary embolus (blood clot in the lungs) include sudden short of breath, feeling lightheaded or dizzy, chest pain with a deep breath, rapid pulse rapid breathing.  Signs of a blood clot in your arms or legs include new unexplained swelling and cramping, warm, red or darkened skin around the painful  area.  Please call the office or 911 right away if these signs or symptoms develop.  PRECAUTIONS:   If you experience chest pain or shortness of breath - call 911 immediately for transfer to the hospital emergency department.   If you develop a fever greater that 101 F, purulent drainage from wound, increased redness or drainage from wound, foul odor from the wound/dressing, or calf pain - CONTACT YOUR SURGEON.                                                   FOLLOW-UP APPOINTMENTS:  If you do not already have a post-op appointment, please call the office for an appointment to be seen by your surgeon.  Guidelines for how soon to be seen are listed in your "After Visit Summary", but are typically between 2-3 weeks after surgery.  If you have a specialized bandage, you may be told to follow up 1 week after surgery.  POST-OPERATIVE OPIOID TAPER INSTRUCTIONS: It is important to wean off of your opioid medication as soon as possible. If you do not need pain medication after your surgery it is ok to stop day one. Opioids include: Codeine, Hydrocodone (Norco, Vicodin), Oxycodone (Percocet, oxycontin ) and hydromorphone  amongst others.  Long term and even short term use of opiods can cause: Increased pain response Dependence Constipation Depression Respiratory depression And more.  Withdrawal symptoms can include Flu like symptoms Nausea, vomiting And more Techniques to manage these symptoms Hydrate well Eat regular healthy meals Stay active Use relaxation techniques(deep breathing, meditating, yoga) Do Not substitute Alcohol  to help with tapering If you have been on opioids for less than two weeks and do not have pain than it is ok to stop all together.  Plan to wean off of opioids This plan should start within one week post op of your joint replacement. Maintain the same interval or time  between taking each dose and first decrease the dose.  Cut the total daily intake of opioids by one  tablet each day Next start to increase the time between doses. The last dose that should be eliminated is the evening dose.   MAKE SURE YOU:  Understand these instructions.  Get help right away if you are not doing well or get worse.    Thank you for letting us  be a part of your medical care team.  It is a privilege we respect greatly.  We hope these instructions will help you stay on track for a fast and full recovery!            Signed: Bernarda CHRISTELLA Mclean 08/17/2023, 6:12 AM

## 2023-08-17 NOTE — Plan of Care (Signed)
  Problem: Education: Goal: Knowledge of General Education information will improve Description: Including pain rating scale, medication(s)/side effects and non-pharmacologic comfort measures Outcome: Progressing   Problem: Health Behavior/Discharge Planning: Goal: Ability to manage health-related needs will improve Outcome: Progressing   Problem: Clinical Measurements: Goal: Ability to maintain clinical measurements within normal limits will improve Outcome: Progressing Goal: Will remain free from infection Outcome: Progressing Goal: Diagnostic test results will improve Outcome: Progressing Goal: Respiratory complications will improve Outcome: Progressing Goal: Cardiovascular complication will be avoided Outcome: Progressing   Problem: Activity: Goal: Risk for activity intolerance will decrease Outcome: Progressing   Problem: Nutrition: Goal: Adequate nutrition will be maintained Outcome: Completed/Met   Problem: Coping: Goal: Level of anxiety will decrease Outcome: Progressing   Problem: Elimination: Goal: Will not experience complications related to bowel motility Outcome: Progressing Goal: Will not experience complications related to urinary retention Outcome: Progressing   Problem: Pain Managment: Goal: General experience of comfort will improve and/or be controlled Outcome: Progressing   Problem: Safety: Goal: Ability to remain free from injury will improve Outcome: Progressing   Problem: Skin Integrity: Goal: Risk for impaired skin integrity will decrease Outcome: Progressing   Problem: Education: Goal: Knowledge of the prescribed therapeutic regimen will improve Outcome: Progressing Goal: Understanding of discharge needs will improve Outcome: Progressing Goal: Individualized Educational Video(s) Outcome: Completed/Met   Problem: Activity: Goal: Ability to avoid complications of mobility impairment will improve Outcome: Progressing Goal: Ability to  tolerate increased activity will improve Outcome: Progressing   Problem: Clinical Measurements: Goal: Postoperative complications will be avoided or minimized Outcome: Progressing   Problem: Pain Management: Goal: Pain level will decrease with appropriate interventions Outcome: Progressing   Problem: Skin Integrity: Goal: Will show signs of wound healing Outcome: Progressing

## 2023-08-17 NOTE — Progress Notes (Signed)
 Physical Therapy Treatment Patient Details Name: Laurie Bridges MRN: [id number] DOB: [birth year] Today's Date: 08/17/2023   History of Present Illness 78 yo female presents to therapy s/p L THA revision secondary to aseptic loosening of L femoral component and failure of conservative management. Pt is currently LLE 50% WB and posterior hip precautions x 6 wks. Pt PMH includes but is not limited to:  abdominal pain, gastritis, concha bullosa, HLD, GERD, CKD, OSA, dysphagia, anemia, anxiety, CAD, arthritis, back surgery, and L THA.    PT Comments  Pt is doing well today, meeting goals in acute setting and feels ready to d/c with family assist prn. Pt son supportive and present for session. Plan is for HHPT f/u post acute.    If plan is discharge home, recommend the following: A little help with walking and/or transfers;A little help with bathing/dressing/bathroom;Assistance with cooking/housework;Assist for transportation;Help with stairs or ramp for entrance   Can travel by private vehicle        Equipment Recommendations  None recommended by PT    Recommendations for Other Services       Precautions / Restrictions Precautions Precautions: Fall Precaution/Restrictions Comments: posterior hip precautions--review Restrictions Weight Bearing Restrictions Per Provider Order: Yes LLE Weight Bearing Per Provider Order: Partial weight bearing LLE Partial Weight Bearing Percentage or Pounds: 50% Other Position/Activity Restrictions: reviewed posterior THP with pt and son, handout given as well     Mobility  Bed Mobility Overal bed mobility: Needs Assistance Bed Mobility: Supine to Sit     Supine to sit: Min assist     General bed mobility comments: cues and min A for L LE to EOB    Transfers Overall transfer level: Needs assistance Equipment used: Rolling walker (2 wheels) Transfers: Sit to/from Stand Sit to Stand: Contact guard assist           General transfer  comment: subtle cues for hand placement    Ambulation/Gait Ambulation/Gait assistance: Contact guard assist, Supervision Gait Distance (Feet): 130 Feet Assistive device: Rolling walker (2 wheels) Gait Pattern/deviations: Step-to pattern,  Decreased stance time Gait velocity: decreased but functional     General Gait Details: cues for sequence, PWB, to maintain THP especially with turns. Good adherence to PWB   Stairs Stairs: Yes Stairs assistance: Contact guard assist, Supervision Stair Management: One rail Right, Step to pattern, Forwards, With cane Number of Stairs: 3 General stair comments: cues for sequence and technique. son present and able to assist, cue as needed. pt with good stability and adherence to [facility]   Wheelchair Mobility     Tilt Bed    Modified Rankin (Stroke Patients Only)       Balance Overall balance assessment: Needs assistance Sitting-balance support: Feet supported Sitting balance-Leahy Scale: Good     Standing balance support: During functional activity, Reliant on assistive device for balance, No upper extremity supported Standing balance-Leahy Scale: Fair                              Hotel manager: No apparent difficulties  Cognition Arousal: Alert Behavior During Therapy: WFL for tasks assessed/performed   PT - Cognitive impairments: No apparent impairments                         Following commands: Intact      Cueing    Exercises Total Joint Exercises Ankle Circles/Pumps: AROM, Both, 10 reps Quad Sets:  (  discussed HEP with regard to hip precautions, pt to have HHPT f/u)    General Comments        Pertinent Vitals/Pain Pain Assessment Pain Assessment: 0-10 Pain Score: 3  Pain Location: L LE and hip Pain Descriptors / Indicators: Aching, Constant, Discomfort, Dull, Grimacing, Operative site guarding Pain Intervention(s): Monitored during session, Limited activity within  patient's tolerance, Repositioned, Ice applied    Home Living                          Prior Function            PT Goals (current goals can now be found in the care plan section) Acute Rehab PT Goals Patient Stated Goal: to have the L hip right and no pain PT Goal Formulation: With patient Time For Goal Achievement: 08/30/23 Potential to Achieve Goals: Good Progress towards PT goals: Progressing toward goals    Frequency    7X/week      PT Plan      Co-evaluation              AM-PAC PT 6 Clicks Mobility   Outcome Measure  Help needed turning from your back to your side while in a flat bed without using bedrails?: A Little Help needed moving from lying on your back to sitting on the side of a flat bed without using bedrails?: A Little Help needed moving to and from a bed to a chair (including a wheelchair)?: A Little Help needed standing up from a chair using your arms (e.g., wheelchair or bedside chair)?: A Little Help needed to walk in hospital room?: A Little Help needed climbing 3-5 steps with a railing? : Total 6 Click Score: 16    End of Session Equipment Utilized During Treatment: Gait belt Activity Tolerance: Patient tolerated treatment well;No increased pain Patient left: in chair;with call bell/phone within reach;with family/visitor present Nurse Communication: Mobility status;Weight bearing status PT Visit Diagnosis: Unsteadiness on feet (R26.81);Other abnormalities of gait and mobility (R26.89);Muscle weakness (generalized) (M62.81);Difficulty in walking, not elsewhere classified (R26.2);Pain Pain - Right/Left: Left Pain - part of body: Leg;Hip     Time: 8855-8787 PT Time Calculation (min) (ACUTE ONLY): 28 min  Charges:    $Gait Training: 23-37 mins PT General Charges $$ ACUTE PT VISIT: 1 Visit                     Collyn Selk, PT  Acute Rehab Dept [facility]) [phone]  08/17/2023    [facility] 08/17/2023, 12:30 PM

## 2023-08-17 NOTE — Anesthesia Postprocedure Evaluation (Signed)
 Anesthesia Post Note  Patient: Saryiah J Bisson  Procedure(s) Performed: REVISION, ARTHROPLASTY, HIP (Left: Hip) APPLICATION, WOUND VAC (Left)     Patient location during evaluation: PACU Anesthesia Type: Spinal Level of consciousness: oriented and awake and alert Pain management: pain level controlled Vital Signs Assessment: post-procedure vital signs reviewed and stable Respiratory status: spontaneous breathing, respiratory function stable and patient connected to nasal cannula oxygen Cardiovascular status: blood pressure returned to baseline and stable Postop Assessment: no headache, no backache and no apparent nausea or vomiting Anesthetic complications: no   No notable events documented.  Last Vitals:  Vitals:   08/17/23 0535 08/17/23 0959  BP: (!) 101/54 (!) 113/96  Pulse: 69 70  Resp: 18 18  Temp: 37.1 C   SpO2: 98% 100%    Last Pain:  Vitals:   08/17/23 1259  TempSrc:   PainSc: 2                  Aniayah Alaniz

## 2023-08-17 NOTE — Progress Notes (Signed)
     1 Day Post-Op Procedure(s) (LRB): REVISION, ARTHROPLASTY, HIP (Left) APPLICATION, WOUND VAC (Left) Subjective:  Patient reports pain as mild to moderate.  Didn't sleep much, usually has medication at home to help sleep.  Walked 76ft with RW with PT yesterday.  Working on stairs today, has to overcome a few to get into house.  Denies chest pain, ShOB, N/V.  Denies numbness or tingling.  50% WB of surgical leg at this time.  No other complaints at this time.  Objective:   VITALS:   Vitals:   08/16/23 1839 08/16/23 2220 08/17/23 0308 08/17/23 0535  BP: 114/70 (!) 110/58 111/64 (!) 101/54  Pulse: 81 70 62 69  Resp: 18 18 16 18   Temp: 98.8 F (37.1 C) 98 F (36.7 C) 98.4 F (36.9 C) 98.8 F (37.1 C)  TempSrc: Oral Oral Oral Oral  SpO2: 99% 98% 98% 98%  Weight:      Height:        AAOx4, resting comfortably in NAD Neurologically intact Sensation intact distally Intact pulses distally Dorsiflexion/Plantar flexion intact Incision: dressing C/D/I Wiggles toes appropriately   Lab Results  Component Value Date   WBC 13.0 (H) 08/17/2023   HGB 11.0 (L) 08/17/2023   HCT 33.4 (L) 08/17/2023   MCV 91.0 08/17/2023   PLT 213 08/17/2023   BMET    Component Value Date/Time   NA 140 08/17/2023 0342   K 4.3 08/17/2023 0342   CL 108 08/17/2023 0342   CO2 22 08/17/2023 0342   GLUCOSE 127 (H) 08/17/2023 0342   BUN 15 08/17/2023 0342   CREATININE 0.85 08/17/2023 0342   CREATININE 0.93 11/25/2020 1028   CALCIUM  9.5 08/17/2023 0342   EGFR 46.0 04/24/2022 0759   GFRNONAA >60 08/17/2023 0342   GFRNONAA 40 (L) 10/26/2019 1511    Yesterday's total administered Morphine Milligram Equivalents: 0  Xray: stable post-operative imaging  Assessment/Plan: 1 Day Post-Op   Principal Problem:   Failed total hip arthroplasty (HCC)  S/P Revision of left total hip arthroplasty 08/16/23  Post op recs: WB: 50% partial weightbearing and posterior hip precautions x 6 weeks Abx: ancef   in-house and discharged on cefadroxil  500 mg twice daily x 7 days, follow-up IntraOp cultures Imaging: PACU pelvis Xray Dressing: Aquacell, keep intact until follow up DVT prophylaxis: Aspirin  81BID starting POD1 Follow up: 2 weeks after surgery for a wound check with Dr. Edna at [facility].  Address: [address], [place], [place] [address]  Office Phone: [phone]   Bernarda CHRISTELLA Mclean 08/17/2023, 5:49 AM   Contact information:   Weekdays 7am-5pm epic message Dr. Edna, or call office for patient follow up: [phone] After hours and holidays please check [email or web address] for group call information for [facility]

## 2023-08-18 DIAGNOSIS — T84031D Mechanical loosening of internal left hip prosthetic joint, subsequent encounter: Secondary | ICD-10-CM | POA: Diagnosis not present

## 2023-08-18 DIAGNOSIS — F419 Anxiety disorder, unspecified: Secondary | ICD-10-CM | POA: Diagnosis not present

## 2023-08-18 DIAGNOSIS — D649 Anemia, unspecified: Secondary | ICD-10-CM | POA: Diagnosis not present

## 2023-08-18 DIAGNOSIS — Z7982 Long term (current) use of aspirin: Secondary | ICD-10-CM | POA: Diagnosis not present

## 2023-08-18 DIAGNOSIS — I2510 Atherosclerotic heart disease of native coronary artery without angina pectoris: Secondary | ICD-10-CM | POA: Diagnosis not present

## 2023-08-18 DIAGNOSIS — Z79899 Other long term (current) drug therapy: Secondary | ICD-10-CM | POA: Diagnosis not present

## 2023-08-18 MED FILL — Aspirin Chew Tab 81 MG: ORAL | Qty: 1 | Status: AC

## 2023-08-19 ENCOUNTER — Encounter (HOSPITAL_COMMUNITY): Payer: Self-pay | Admitting: Orthopedic Surgery

## 2023-08-20 DIAGNOSIS — F419 Anxiety disorder, unspecified: Secondary | ICD-10-CM | POA: Diagnosis not present

## 2023-08-20 DIAGNOSIS — D649 Anemia, unspecified: Secondary | ICD-10-CM | POA: Diagnosis not present

## 2023-08-20 DIAGNOSIS — Z7982 Long term (current) use of aspirin: Secondary | ICD-10-CM | POA: Diagnosis not present

## 2023-08-20 DIAGNOSIS — T84031D Mechanical loosening of internal left hip prosthetic joint, subsequent encounter: Secondary | ICD-10-CM | POA: Diagnosis not present

## 2023-08-20 DIAGNOSIS — I2510 Atherosclerotic heart disease of native coronary artery without angina pectoris: Secondary | ICD-10-CM | POA: Diagnosis not present

## 2023-08-20 DIAGNOSIS — Z79899 Other long term (current) drug therapy: Secondary | ICD-10-CM | POA: Diagnosis not present

## 2023-08-21 LAB — AEROBIC/ANAEROBIC CULTURE W GRAM STAIN (SURGICAL/DEEP WOUND)
Culture: NO GROWTH
Culture: NO GROWTH
Gram Stain: NONE SEEN
Gram Stain: NONE SEEN
Gram Stain: NONE SEEN

## 2023-08-25 DIAGNOSIS — D649 Anemia, unspecified: Secondary | ICD-10-CM | POA: Diagnosis not present

## 2023-08-25 DIAGNOSIS — I2510 Atherosclerotic heart disease of native coronary artery without angina pectoris: Secondary | ICD-10-CM | POA: Diagnosis not present

## 2023-08-25 DIAGNOSIS — T84031D Mechanical loosening of internal left hip prosthetic joint, subsequent encounter: Secondary | ICD-10-CM | POA: Diagnosis not present

## 2023-08-25 DIAGNOSIS — Z7982 Long term (current) use of aspirin: Secondary | ICD-10-CM | POA: Diagnosis not present

## 2023-08-25 DIAGNOSIS — F419 Anxiety disorder, unspecified: Secondary | ICD-10-CM | POA: Diagnosis not present

## 2023-08-25 DIAGNOSIS — Z79899 Other long term (current) drug therapy: Secondary | ICD-10-CM | POA: Diagnosis not present

## 2023-08-27 DIAGNOSIS — Z79899 Other long term (current) drug therapy: Secondary | ICD-10-CM | POA: Diagnosis not present

## 2023-08-27 DIAGNOSIS — M1612 Unilateral primary osteoarthritis, left hip: Secondary | ICD-10-CM | POA: Diagnosis not present

## 2023-08-27 DIAGNOSIS — D649 Anemia, unspecified: Secondary | ICD-10-CM | POA: Diagnosis not present

## 2023-08-27 DIAGNOSIS — T84031D Mechanical loosening of internal left hip prosthetic joint, subsequent encounter: Secondary | ICD-10-CM | POA: Diagnosis not present

## 2023-08-27 DIAGNOSIS — I2510 Atherosclerotic heart disease of native coronary artery without angina pectoris: Secondary | ICD-10-CM | POA: Diagnosis not present

## 2023-08-27 DIAGNOSIS — F419 Anxiety disorder, unspecified: Secondary | ICD-10-CM | POA: Diagnosis not present

## 2023-08-27 DIAGNOSIS — Z7982 Long term (current) use of aspirin: Secondary | ICD-10-CM | POA: Diagnosis not present

## 2023-08-30 DIAGNOSIS — F419 Anxiety disorder, unspecified: Secondary | ICD-10-CM | POA: Diagnosis not present

## 2023-08-30 DIAGNOSIS — D649 Anemia, unspecified: Secondary | ICD-10-CM | POA: Diagnosis not present

## 2023-08-30 DIAGNOSIS — I2510 Atherosclerotic heart disease of native coronary artery without angina pectoris: Secondary | ICD-10-CM | POA: Diagnosis not present

## 2023-08-30 DIAGNOSIS — Z79899 Other long term (current) drug therapy: Secondary | ICD-10-CM | POA: Diagnosis not present

## 2023-08-30 DIAGNOSIS — Z7982 Long term (current) use of aspirin: Secondary | ICD-10-CM | POA: Diagnosis not present

## 2023-08-30 DIAGNOSIS — T84031D Mechanical loosening of internal left hip prosthetic joint, subsequent encounter: Secondary | ICD-10-CM | POA: Diagnosis not present

## 2023-08-31 DIAGNOSIS — M1612 Unilateral primary osteoarthritis, left hip: Secondary | ICD-10-CM | POA: Diagnosis not present

## 2023-09-01 DIAGNOSIS — D649 Anemia, unspecified: Secondary | ICD-10-CM | POA: Diagnosis not present

## 2023-09-01 DIAGNOSIS — I2510 Atherosclerotic heart disease of native coronary artery without angina pectoris: Secondary | ICD-10-CM | POA: Diagnosis not present

## 2023-09-01 DIAGNOSIS — Z7982 Long term (current) use of aspirin: Secondary | ICD-10-CM | POA: Diagnosis not present

## 2023-09-01 DIAGNOSIS — T84031D Mechanical loosening of internal left hip prosthetic joint, subsequent encounter: Secondary | ICD-10-CM | POA: Diagnosis not present

## 2023-09-01 DIAGNOSIS — F419 Anxiety disorder, unspecified: Secondary | ICD-10-CM | POA: Diagnosis not present

## 2023-09-01 DIAGNOSIS — Z79899 Other long term (current) drug therapy: Secondary | ICD-10-CM | POA: Diagnosis not present

## 2023-09-08 ENCOUNTER — Encounter: Payer: Self-pay | Admitting: Podiatry

## 2023-09-08 ENCOUNTER — Ambulatory Visit: Admitting: Podiatry

## 2023-09-08 VITALS — Ht 66.5 in | Wt 148.0 lb

## 2023-09-08 DIAGNOSIS — L309 Dermatitis, unspecified: Secondary | ICD-10-CM | POA: Diagnosis not present

## 2023-09-08 DIAGNOSIS — B49 Unspecified mycosis: Secondary | ICD-10-CM

## 2023-09-08 MED ORDER — METHYLPREDNISOLONE 4 MG PO TBPK: ORAL_TABLET | ORAL | 0 refills | Status: DC

## 2023-09-08 MED ORDER — TERBINAFINE HCL 250 MG PO TABS: 250.0000 mg | ORAL_TABLET | Freq: Every day | ORAL | 0 refills | Status: DC

## 2023-09-08 NOTE — Progress Notes (Signed)
 Subjective:   Patient ID: Laurie Bridges, female   DOB: 78 y.o.   MRN: [id number]   HPI Patient states she has had a lot of cracking and peeling in her skin which started in her toes and has now gone to her fingers.  States that at times it itches and that she has seen to dermatologist has been placed on several creams without relief of her symptoms.  Patient presents with caregiver does not smoke currently and likes to be active   Review of Systems  All other systems reviewed and are negative.       Objective:  Physical Exam Vitals and nursing note reviewed.  Constitutional:      Appearance: She is well-developed.  Pulmonary:     Effort: Pulmonary effort is normal.  Musculoskeletal:        General: Normal range of motion.  Skin:    General: Skin is warm.  Neurological:     Mental Status: She is alert.     Neurovascular status found to be intact muscle strength found to be adequate range of motion adequate with patient noted to have breakdown of tissue right big toe along the right lateral plantar foot and mildly in the left foot.  Smalls patches also on her hands noted currently     Assessment:  Difficult to separate a possible inflammatory condition versus a fungal condition versus some form of allergic reaction     Plan:  H&P reviewed conditions with the patient and caregiver and I am going to try her on a oral steroid Dosepak to be followed by an antifungal treatment.  Patient will be seen back as needed and also I recommended dermatology logical consult if symptoms do not improve with what you are doing today with instructions on usage of these medications

## 2023-09-09 ENCOUNTER — Telehealth: Payer: Self-pay | Admitting: Pharmacist

## 2023-09-09 NOTE — Telephone Encounter (Signed)
 Called [employer] and gave verbal for product replacement

## 2023-09-14 ENCOUNTER — Ambulatory Visit (INDEPENDENT_AMBULATORY_CARE_PROVIDER_SITE_OTHER): Payer: Medicare PPO | Admitting: Otolaryngology

## 2023-09-17 ENCOUNTER — Other Ambulatory Visit (HOSPITAL_COMMUNITY): Payer: Self-pay | Admitting: Internal Medicine

## 2023-09-17 DIAGNOSIS — Z1231 Encounter for screening mammogram for malignant neoplasm of breast: Secondary | ICD-10-CM

## 2023-09-28 DIAGNOSIS — M1612 Unilateral primary osteoarthritis, left hip: Secondary | ICD-10-CM | POA: Diagnosis not present

## 2023-09-30 DIAGNOSIS — Z79899 Other long term (current) drug therapy: Secondary | ICD-10-CM | POA: Diagnosis not present

## 2023-09-30 DIAGNOSIS — L308 Other specified dermatitis: Secondary | ICD-10-CM | POA: Diagnosis not present

## 2023-10-07 ENCOUNTER — Ambulatory Visit (INDEPENDENT_AMBULATORY_CARE_PROVIDER_SITE_OTHER): Admitting: Gastroenterology

## 2023-10-07 ENCOUNTER — Encounter (INDEPENDENT_AMBULATORY_CARE_PROVIDER_SITE_OTHER): Payer: Self-pay | Admitting: Gastroenterology

## 2023-10-07 VITALS — BP 144/75 | HR 78 | Temp 98.1°F | Ht 66.5 in | Wt 152.6 lb

## 2023-10-07 DIAGNOSIS — R143 Flatulence: Secondary | ICD-10-CM

## 2023-10-07 DIAGNOSIS — K219 Gastro-esophageal reflux disease without esophagitis: Secondary | ICD-10-CM | POA: Diagnosis not present

## 2023-10-07 DIAGNOSIS — D509 Iron deficiency anemia, unspecified: Secondary | ICD-10-CM | POA: Diagnosis not present

## 2023-10-07 DIAGNOSIS — K5900 Constipation, unspecified: Secondary | ICD-10-CM | POA: Diagnosis not present

## 2023-10-07 DIAGNOSIS — K3184 Gastroparesis: Secondary | ICD-10-CM | POA: Diagnosis not present

## 2023-10-07 MED ORDER — METOCLOPRAMIDE HCL 10 MG PO TABS: 10.0000 mg | ORAL_TABLET | Freq: Three times a day (TID) | ORAL | 1 refills | Status: DC | PRN

## 2023-10-07 NOTE — Progress Notes (Addendum)
 Referring Provider: Sheryle Carwin, MD Primary Care Physician:  Sheryle Carwin, MD Primary GI Physician: Dr. Eartha   Chief Complaint  Patient presents with   Follow-up    Pt arrives for follow up. Pt had hip revision about 7 weeks ago and is now gassy. Pt also states when she goes to the bathroom, its like a bomb exploded.    HPI:   Laurie Bridges is a 78 y.o. female with past medical history of anemia, anxiety, arthritis, CAD, GERD, HTN, gatroparesis   Patient presenting today for:  Follow up of GERD, gastroparesis, IDA, constipation and flatulence   Last seen 02/2023, at that time, havign nausea, using zofran  with good results. Previously on reglan . No further epigastric pain, not taking iron. Having constipation, taking miralax  PRN.   Recommended to recheck iron levels, consider TCS If iron remains low, continue dexilant  60mg  daily, zofran  prn, 4-5 small meals per day  Iron in December with iron 66 sat 23 tibc 286  Hgb on 8/5 was 11 (s/p hip surgery), hgb 13.8 in July prior to surgery   Present:  States recent hip revision surgery on 8/4. She notes a lot of gas in her belly since the surgery. She notes she has been on some steroids. She notes some constipation, has been taking miralax  as needed.  Has seen some yellow color on toilet tissue but denies any diarrhea. She did not require any pain meds after she was discharged, only took muscle relaxers for about 1 week. She took some medication for gas from her daughter in law which she states helped but she is not sure the name of these. Denies rectal bleeding or melena.  Nausea is well controlled. Very occasionally will take a reglan  but otherwise not taking any anti emetics on regular basis. She was given zofran  Rx when discharged but she has not used this as she felt in the past this did not help.   She is taking dexilant  60mg  daily which seems to keep her GERD well controlled. No dysphagia or odynophagia.   Has upcoming labs  with PCP in a few weeks.    GES: 12/02/20: Normal gastric emptying in the first 3 hours with slight decrease in the fourth hour. Last Colonoscopy:02/2020 One diminutive polyp in the ascending colon.                            Biopsied-TA                            - External hemorrhoids.                           - Anal papilla(e) were hypertrophied.   Last Endoscopy:12/2022 normal esophagus, 2 cm hiatal hernia, erythematous mucosa in antrum biopsied, normal duodenal bulb and second portion of the duodenum STOMACH, RANDOM, BIOPSY:  Gastric antral / oxyntic mucosa with focal chronic inactive gastritis.  No H. pylori identified on HE stain.  Negative for intestinal metaplasia or dysplasia.    Filed Weights   10/07/23 0957  Weight: 152 lb 9.6 oz (69.2 kg)     Past Medical History:  Diagnosis Date   Anemia    hx of Fe def many years ago   Anxiety    Arthritis    back   CAD (coronary artery disease)    a. s/p Coronary CT in  06/2018 showing mild stenosis along the mid-LAD, mid-LCx and mid-RCA   Cancer (HCC)    carcinoma removed on head 2024   Chronic kidney disease    GERD (gastroesophageal reflux disease)    Hypertension    no meds in several years   PONV (postoperative nausea and vomiting)    Pre-diabetes     Past Surgical History:  Procedure Laterality Date   ABDOMINAL HYSTERECTOMY     partial hysterectomy, ovaries remain   APPLICATION OF WOUND VAC Left 08/16/2023   Procedure: APPLICATION, WOUND VAC;  Surgeon: Edna Toribio LABOR, MD;  Location: [facility];  Service: Orthopedics;  Laterality: Left;   BACK SURGERY     BIOPSY  12/29/2022   Procedure: BIOPSY;  Surgeon: Cinderella Deatrice FALCON, MD;  Location: [facility];  Service: Endoscopy;;   CATARACT EXTRACTION W/PHACO Right 01/15/2023   Procedure: CATARACT EXTRACTION PHACO AND INTRAOCULAR LENS PLACEMENT (IOC);  Surgeon: Harrie Agent, MD;  Location: [facility];  Service: Ophthalmology;  Laterality: Right;  CDE 6.32   CATARACT  EXTRACTION W/PHACO Left 01/29/2023   Procedure: CATARACT EXTRACTION PHACO AND INTRAOCULAR LENS PLACEMENT (IOC);  Surgeon: Harrie Agent, MD;  Location: [facility];  Service: Ophthalmology;  Laterality: Left;  CDE: 7.39   CHOLECYSTECTOMY N/A 11/27/2019   Procedure: LAPAROSCOPIC CHOLECYSTECTOMY;  Surgeon: Kallie Manuelita BROCKS, MD;  Location: [facility];  Service: General;  Laterality: N/A;   COLONOSCOPY N/A 02/29/2020   Procedure: COLONOSCOPY;  Surgeon: Golda Claudis PENNER, MD;  [facility]: [facility];  Service: Endoscopy;  Laterality: N/A;  8:45   ESOPHAGEAL DILATION N/A 11/17/2017   Procedure: ESOPHAGEAL DILATION;  Surgeon: Golda Claudis PENNER, MD;  [facility];  Service: Endoscopy;  Laterality: N/A;   ESOPHAGOGASTRODUODENOSCOPY N/A 11/17/2017   Procedure: ESOPHAGOGASTRODUODENOSCOPY (EGD);  Surgeon: Golda Claudis PENNER, MD;  [facility];  Service: Endoscopy;  Laterality: N/A;  12:00   ESOPHAGOGASTRODUODENOSCOPY N/A 11/02/2019   Procedure: ESOPHAGOGASTRODUODENOSCOPY (EGD);  Surgeon: Golda Claudis PENNER, MD;  [facility];  Service: Endoscopy;  Laterality: N/A;  945   ESOPHAGOGASTRODUODENOSCOPY (EGD) WITH PROPOFOL  N/A 12/29/2022   Procedure: ESOPHAGOGASTRODUODENOSCOPY (EGD) WITH PROPOFOL ;  Surgeon: Cinderella Deatrice FALCON, MD;  [facility];  Service: Endoscopy;  Laterality: N/A;  11:15AM;ASA 3   FOOT SURGERY     POLYPECTOMY  02/29/2020   Procedure: POLYPECTOMY;  Surgeon: Golda Claudis PENNER, MD;  [facility];  Service: Endoscopy;;   REVISION TOTAL HIP ARTHROPLASTY Left    TOTAL HIP ARTHROPLASTY WITH HARDWARE REMOVAL Left 08/16/2023   Procedure: REVISION, ARTHROPLASTY, HIP;  Surgeon: Edna Toribio LABOR, MD;  Location: [facility];  Service: Orthopedics;  Laterality: Left;    Current Outpatient Medications  Medication Sig Dispense Refill   aspirin  EC 81 MG tablet Take 81 mg by mouth daily. Swallow whole.     augmented betamethasone  dipropionate (DIPROLENE -AF) 0.05 % cream  Apply topically 2 (two) times daily as needed.     Cholecalciferol (VITAMIN D3) 50 MCG (2000 UT) capsule Take 2,000 Units by mouth daily.     CINNAMON PO Take 120 mg by mouth daily. Apple cider vinegar     clobetasol ointment (TEMOVATE) 0.05 % SMARTSIG:1 sparingly Topical Every Evening     dexlansoprazole  (DEXILANT ) 60 MG capsule TAKE (1) CAPSULE BY MOUTH ONCE DAILY. 30 capsule 11   Evolocumab  (REPATHA  SURECLICK) 140 MG/ML SOAJ Inject 140 mg into the skin every 14 (fourteen) days. 6 mL 3   fluticasone  (FLONASE ) 50 MCG/ACT nasal spray Place 2 sprays  into both nostrils 2 (two) times daily.     Levocetirizine Dihydrochloride  (XYZAL  PO) Take by mouth.     LORazepam (ATIVAN) 1 MG tablet Take 1 mg by mouth at bedtime.     Multiple Vitamin (MULTIVITAMIN) tablet Take 1 tablet by mouth daily. One a Day     predniSONE (DELTASONE) 10 MG tablet Take by mouth.     rosuvastatin  (CRESTOR ) 20 MG tablet Take 1 tablet 3 times per week ( M,W,F) 90 tablet 3   cetirizine (ZYRTEC) 10 MG tablet Take 10 mg by mouth daily. (Patient not taking: Reported on 10/07/2023)     ciclopirox (LOPROX) 0.77 % cream Apply 1 Application topically 2 (two) times daily. Toe (Patient not taking: Reported on 10/07/2023)     guaiFENesin  (MUCINEX  PO) Take 1,200 mg by mouth daily as needed (Congestion). (Patient not taking: Reported on 10/07/2023)     Polyethylene Glycol 3350  (MIRALAX  PO) Take 1 Capful by mouth daily as needed (Constipation). (Patient not taking: Reported on 10/07/2023)     terbinafine  (LAMISIL ) 250 MG tablet Take 1 tablet (250 mg total) by mouth daily. (Patient not taking: Reported on 10/07/2023) 30 tablet 0   urea  (CARMOL) 40 % CREA Apply 1 application  topically 2 (two) times daily. Toes (Patient not taking: Reported on 10/07/2023)     No current facility-administered medications for this visit.    Allergies as of 10/07/2023 - Review Complete 10/07/2023  Allergen Reaction Noted   Codeine Other (See Comments) 05/27/2010    Demerol  Rash 05/27/2010   Elemental sulfur  Rash 05/27/2010   Feldene [piroxicam] Rash 05/27/2010   Latex Rash 05/27/2010   Pyridium [phenazopyridine hcl] Itching and Rash 05/27/2010    Social History   Socioeconomic History   Marital status: Widowed    Spouse name: Not on file   Number of children: Not on file   Years of education: Not on file   Highest education level: Not on file  Occupational History   Not on file  Tobacco Use   Smoking status: Never    Passive exposure: Past   Smokeless tobacco: Never  Vaping Use   Vaping status: Never Used  Substance and Sexual Activity   Alcohol  use: No   Drug use: No   Sexual activity: Never  Other Topics Concern   Not on file  Social History Narrative   Not on file   Social Drivers of Health   Financial Resource Strain: Not on file  Food Insecurity: No Food Insecurity (08/16/2023)   Hunger Vital Sign    Worried About Running Out of Food in the Last Year: Never true    Ran Out of Food in the Last Year: Never true  Transportation Needs: No Transportation Needs (08/16/2023)   PRAPARE - Administrator, Civil Service (Medical): No    Lack of Transportation (Non-Medical): No  Physical Activity: Not on file  Stress: Not on file  Social Connections: Moderately Integrated (08/16/2023)   Social Connection and Isolation Panel    Frequency of Communication with Friends and Family: More than three times a week    Frequency of Social Gatherings with Friends and Family: More than three times a week    Attends Religious Services: More than 4 times per year    Active Member of [employer] or Organizations: Yes    Attends Banker Meetings: More than 4 times per year    Marital Status: Widowed    Review of systems General: negative for malaise, night  sweats, fever, chills, weight loss Neck: Negative for lumps, goiter, pain and significant neck swelling Resp: Negative for cough, wheezing, dyspnea at rest CV: Negative for  chest pain, leg swelling, palpitations, orthopnea GI: denies melena, hematochezia, nausea, vomiting, diarrhea, constipation, dysphagia, odyonophagia, early satiety or unintentional weight loss. +flatulence  MSK: Negative for joint pain or swelling, back pain, and muscle pain. Derm: Negative for itching or rash Psych: Denies depression, anxiety, memory loss, confusion. No homicidal or suicidal ideation.  Heme: Negative for prolonged bleeding, bruising easily, and swollen nodes. Endocrine: Negative for cold or heat intolerance, polyuria, polydipsia and goiter. Neuro: negative for tremor, gait imbalance, syncope and seizures. The remainder of the review of systems is noncontributory.  Physical Exam: BP (!) 144/75   Pulse 78   Temp 98.1 F (36.7 C)   Ht 5' 6.5 (1.689 m)   Wt 152 lb 9.6 oz (69.2 kg)   BMI 24.26 kg/m  General:   Alert and oriented. No distress noted. Pleasant and cooperative.  Head:  Normocephalic and atraumatic. Eyes:  Conjuctiva clear without scleral icterus. Mouth:  Oral mucosa pink and moist. Good dentition. No lesions. Heart: Normal rate and rhythm, s1 and s2 heart sounds present.  Lungs: Clear lung sounds in all lobes. Respirations equal and unlabored. Abdomen:  +BS, soft, non-tender and non-distended. No rebound or guarding. No HSM or masses noted. Derm: No palmar erythema or jaundice Msk:  Symmetrical without gross deformities. Normal posture. Extremities:  Without edema. Neurologic:  Alert and  oriented x4 Psych:  Alert and cooperative. Normal mood and affect.  Invalid input(s): 6 MONTHS   ASSESSMENT: Laurie Bridges is a 77 y.o. female presenting today GERD, gastroparesis, IDA, constipation and flatulence   GERD: well controlled on dexilant  60mg  daily. Denies breakthrough. Will continue with current PPI regimen, good reflux precautions   Constipation:well managed with PRN miralax . No rectal bleeding or melena. Will continue with current regimen with  Good water  intake, diet high in fruits, veggies, whole grains.   IDA: recent hgb in August was down slightly to 11 after recent hip surgery though has been WNL prior to this. She is not taking iron pills. Last Iron studies in December 2024 WNL. She has upcoming labs with PCP next week, I will order iron studies for her to have done at this time.   Gastroparesis:  has occasional nausea, takes reglan  just as needed, fairly infrequent. Will send a refill for her to have on hand, we did discuss using this sparingly due to potential side effects though she has tolerated the medication well in the past. Encouraged to do 4-5 smaller meals per day vs. 2-3 larger meals   Flatulence: noting more flatulence after recent hip surgery.  She was on some antibiotics after her surgery but denies diarrhea or abdominal pain.did have some improvement with some otc medication for gas that a family member gave her. We discussed that flatulence may very well be related to recent surgery. Would recommend starting daily probiotic and can try otc gas pills such as phazyme or gas x to see if this helps.    PLAN:  -reglan  PRN for significant nausea  -continue dexilant  60mg  daily -phazyme or gas x -start daily probiotic - iron panel when PCP checks labs -continue miralax  PRN  All questions were answered, patient verbalized understanding and is in agreement with plan as outlined above.    Follow Up: 3 months   Raynisha Avilla L. Kaeo Jacome, MSN, APRN, AGNP-C Adult-Gerontology Nurse Practitioner [facility]  I have reviewed the note and agree with the APP's assessment as described in this progress note  Toribio Fortune, MD Gastroenterology and Hepatology [facility]

## 2023-10-07 NOTE — Patient Instructions (Signed)
-  can use reglan  sparingly, as needed for nausea, continue to do 4-5 smaller meals per day vs. 2-3 larger meals  -continue dexilant  60mg  daily -phazyme or gas x over the counter for gas -start daily probiotic -will have you get iron panel drawn when PCP checks labs -continue miralax  as needed  Follow up 3 months  It was a pleasure to see you today. I want to create trusting relationships with patients and provide genuine, compassionate, and quality care. I truly value your feedback! please be on the lookout for a survey regarding your visit with me today. I appreciate your input about our visit and your time in completing this!    Bunny Lowdermilk L. Uthman Mroczkowski, MSN, APRN, AGNP-C Adult-Gerontology Nurse Practitioner [facility] at [facility]

## 2023-10-11 ENCOUNTER — Ambulatory Visit (HOSPITAL_COMMUNITY)

## 2023-10-20 DIAGNOSIS — R202 Paresthesia of skin: Secondary | ICD-10-CM | POA: Diagnosis not present

## 2023-10-21 DIAGNOSIS — L2089 Other atopic dermatitis: Secondary | ICD-10-CM | POA: Diagnosis not present

## 2023-10-25 DIAGNOSIS — E616 Vanadium deficiency: Secondary | ICD-10-CM | POA: Diagnosis not present

## 2023-10-25 DIAGNOSIS — R202 Paresthesia of skin: Secondary | ICD-10-CM | POA: Diagnosis not present

## 2023-10-25 DIAGNOSIS — D509 Iron deficiency anemia, unspecified: Secondary | ICD-10-CM | POA: Diagnosis not present

## 2023-10-25 DIAGNOSIS — I2510 Atherosclerotic heart disease of native coronary artery without angina pectoris: Secondary | ICD-10-CM | POA: Diagnosis not present

## 2023-10-25 DIAGNOSIS — F419 Anxiety disorder, unspecified: Secondary | ICD-10-CM | POA: Diagnosis not present

## 2023-10-25 DIAGNOSIS — D649 Anemia, unspecified: Secondary | ICD-10-CM | POA: Diagnosis not present

## 2023-10-25 DIAGNOSIS — Z79899 Other long term (current) drug therapy: Secondary | ICD-10-CM | POA: Diagnosis not present

## 2023-10-26 ENCOUNTER — Ambulatory Visit (INDEPENDENT_AMBULATORY_CARE_PROVIDER_SITE_OTHER): Payer: Self-pay | Admitting: Gastroenterology

## 2023-10-26 DIAGNOSIS — Z96642 Presence of left artificial hip joint: Secondary | ICD-10-CM | POA: Diagnosis not present

## 2023-10-26 DIAGNOSIS — Z4789 Encounter for other orthopedic aftercare: Secondary | ICD-10-CM | POA: Diagnosis not present

## 2023-10-26 LAB — IRON,TIBC AND FERRITIN PANEL
Ferritin: 32 ng/mL (ref 15–150)
Iron Saturation: 23 % (ref 15–55)
Iron: 67 ug/dL (ref 27–139)
Total Iron Binding Capacity: 286 ug/dL (ref 250–450)
UIBC: 219 ug/dL (ref 118–369)

## 2023-10-27 ENCOUNTER — Encounter (INDEPENDENT_AMBULATORY_CARE_PROVIDER_SITE_OTHER): Payer: Self-pay | Admitting: Gastroenterology

## 2023-11-01 DIAGNOSIS — L2081 Atopic neurodermatitis: Secondary | ICD-10-CM | POA: Diagnosis not present

## 2023-11-01 DIAGNOSIS — Z23 Encounter for immunization: Secondary | ICD-10-CM | POA: Diagnosis not present

## 2023-11-01 DIAGNOSIS — E785 Hyperlipidemia, unspecified: Secondary | ICD-10-CM | POA: Diagnosis not present

## 2023-11-01 DIAGNOSIS — R202 Paresthesia of skin: Secondary | ICD-10-CM | POA: Diagnosis not present

## 2023-11-04 DIAGNOSIS — R262 Difficulty in walking, not elsewhere classified: Secondary | ICD-10-CM | POA: Diagnosis not present

## 2023-11-04 DIAGNOSIS — M1612 Unilateral primary osteoarthritis, left hip: Secondary | ICD-10-CM | POA: Diagnosis not present

## 2023-11-04 DIAGNOSIS — M6281 Muscle weakness (generalized): Secondary | ICD-10-CM | POA: Diagnosis not present

## 2023-11-08 ENCOUNTER — Encounter (HOSPITAL_COMMUNITY): Payer: Self-pay

## 2023-11-08 ENCOUNTER — Ambulatory Visit (HOSPITAL_COMMUNITY)
Admission: RE | Admit: 2023-11-08 | Discharge: 2023-11-08 | Disposition: A | Discharge status: Home / Self Care | Source: Ambulatory Visit | Attending: Internal Medicine | Admitting: Internal Medicine

## 2023-11-08 DIAGNOSIS — Z1231 Encounter for screening mammogram for malignant neoplasm of breast: Secondary | ICD-10-CM | POA: Diagnosis not present

## 2023-11-12 DIAGNOSIS — M6281 Muscle weakness (generalized): Secondary | ICD-10-CM | POA: Diagnosis not present

## 2023-11-12 DIAGNOSIS — M1612 Unilateral primary osteoarthritis, left hip: Secondary | ICD-10-CM | POA: Diagnosis not present

## 2023-11-12 DIAGNOSIS — R262 Difficulty in walking, not elsewhere classified: Secondary | ICD-10-CM | POA: Diagnosis not present

## 2023-11-15 DIAGNOSIS — H04123 Dry eye syndrome of bilateral lacrimal glands: Secondary | ICD-10-CM | POA: Diagnosis not present

## 2023-11-17 DIAGNOSIS — M6281 Muscle weakness (generalized): Secondary | ICD-10-CM | POA: Diagnosis not present

## 2023-11-17 DIAGNOSIS — R262 Difficulty in walking, not elsewhere classified: Secondary | ICD-10-CM | POA: Diagnosis not present

## 2023-11-17 DIAGNOSIS — M1612 Unilateral primary osteoarthritis, left hip: Secondary | ICD-10-CM | POA: Diagnosis not present

## 2023-11-24 DIAGNOSIS — M6281 Muscle weakness (generalized): Secondary | ICD-10-CM | POA: Diagnosis not present

## 2023-11-24 DIAGNOSIS — M1612 Unilateral primary osteoarthritis, left hip: Secondary | ICD-10-CM | POA: Diagnosis not present

## 2023-11-24 DIAGNOSIS — R262 Difficulty in walking, not elsewhere classified: Secondary | ICD-10-CM | POA: Diagnosis not present

## 2023-11-24 DIAGNOSIS — L308 Other specified dermatitis: Secondary | ICD-10-CM | POA: Diagnosis not present

## 2023-11-29 ENCOUNTER — Other Ambulatory Visit (INDEPENDENT_AMBULATORY_CARE_PROVIDER_SITE_OTHER): Payer: Self-pay | Admitting: Gastroenterology

## 2023-11-29 DIAGNOSIS — L258 Unspecified contact dermatitis due to other agents: Secondary | ICD-10-CM | POA: Diagnosis not present

## 2023-12-01 DIAGNOSIS — R262 Difficulty in walking, not elsewhere classified: Secondary | ICD-10-CM | POA: Diagnosis not present

## 2023-12-01 DIAGNOSIS — M6281 Muscle weakness (generalized): Secondary | ICD-10-CM | POA: Diagnosis not present

## 2023-12-01 DIAGNOSIS — M1612 Unilateral primary osteoarthritis, left hip: Secondary | ICD-10-CM | POA: Diagnosis not present

## 2023-12-02 DIAGNOSIS — L258 Unspecified contact dermatitis due to other agents: Secondary | ICD-10-CM | POA: Diagnosis not present

## 2023-12-07 ENCOUNTER — Encounter (INDEPENDENT_AMBULATORY_CARE_PROVIDER_SITE_OTHER): Payer: Self-pay | Admitting: Gastroenterology

## 2023-12-07 ENCOUNTER — Encounter: Payer: Self-pay | Admitting: Internal Medicine

## 2023-12-07 DIAGNOSIS — Z96642 Presence of left artificial hip joint: Secondary | ICD-10-CM | POA: Diagnosis not present

## 2023-12-07 DIAGNOSIS — M6281 Muscle weakness (generalized): Secondary | ICD-10-CM | POA: Diagnosis not present

## 2023-12-07 DIAGNOSIS — R262 Difficulty in walking, not elsewhere classified: Secondary | ICD-10-CM | POA: Diagnosis not present

## 2023-12-07 DIAGNOSIS — M1612 Unilateral primary osteoarthritis, left hip: Secondary | ICD-10-CM | POA: Diagnosis not present

## 2023-12-22 DIAGNOSIS — L258 Unspecified contact dermatitis due to other agents: Secondary | ICD-10-CM | POA: Diagnosis not present

## 2023-12-22 DIAGNOSIS — L308 Other specified dermatitis: Secondary | ICD-10-CM | POA: Diagnosis not present

## 2024-01-20 ENCOUNTER — Ambulatory Visit (INDEPENDENT_AMBULATORY_CARE_PROVIDER_SITE_OTHER): Admitting: Gastroenterology

## 2024-01-20 VITALS — BP 114/64 | HR 75 | Temp 97.1°F | Ht 66.5 in | Wt 154.5 lb

## 2024-01-20 DIAGNOSIS — K5900 Constipation, unspecified: Secondary | ICD-10-CM

## 2024-01-20 DIAGNOSIS — K3184 Gastroparesis: Secondary | ICD-10-CM

## 2024-01-20 DIAGNOSIS — D509 Iron deficiency anemia, unspecified: Secondary | ICD-10-CM

## 2024-01-20 DIAGNOSIS — R143 Flatulence: Secondary | ICD-10-CM

## 2024-01-20 DIAGNOSIS — R202 Paresthesia of skin: Secondary | ICD-10-CM

## 2024-01-20 MED ORDER — METOCLOPRAMIDE HCL 10 MG PO TABS: 10.0000 mg | ORAL_TABLET | Freq: Three times a day (TID) | ORAL | 1 refills | Status: AC | PRN

## 2024-01-20 MED ORDER — DEXLANSOPRAZOLE 60 MG PO CPDR: 60.0000 mg | DELAYED_RELEASE_CAPSULE | Freq: Every day | ORAL | 11 refills | Status: AC

## 2024-01-20 NOTE — Progress Notes (Unsigned)
 "  Referring Provider: Sheryle Carwin, MD Primary Care Physician:  Sheryle Carwin, MD Primary GI Physician: Previously Dr. Eartha (Dr. Cinderella)   Chief Complaint  Patient presents with   Follow-up    Pt here for follow up not sure if gastroparesis or iron levels. She hasn't had blood work done at this time.   HPI:   Laurie Bridges is a 79 y.o. female with past medical history of anemia, anxiety, arthritis, CAD, GERD, HTN, gatroparesis   Patient presenting today for:  Follow up of IDA, constipation and reglan    Last seen September, at that time had recent hip surgery on August, having a lot of gas since then, some constipation and taking miralax  PRN. Nausea well controlled, takes reglan  on occasion, taking dexilant  and GERD well controlled, no symptoms at that time  Recommended continue reglan  PRN, continue dexilant  60mg  daily, phazyme or gas x for gas, start daily probiotic, iron panel, continue miralax  PRN  Last iron panel I October with TIBC 286, Iron 67 iron sat 23, ferritin 32 Hgb 13.4  in october   Present:  States hip is not doing well after surgery in August. She started having worsening eczema on hands and feet over the past few months as well. She had some allergy testing and found out she is allergic to some chemicals found in certain dyes. She notes some burning/tingling sensation that she tends to have all over. She notes some almost numb sensation to the tips of her fingers. No history of B12 deficiency. She has upcoming labs with Dr. Sheryle next week, is unsure if she is checking iron levels or not. She is not taking iron pill at this time. No rectal bleeding or melena. She has noted more stress recently, she had to restart her zoloft and is taking reglan  as needed for more nausea recently.   She stopped miralax  and has done some mirafast chews and prebiotic but notes she has had some diarrhea.    GES: 12/02/20: Normal gastric emptying in the first 3 hours with slight decrease  in the fourth hour. Last Colonoscopy:02/2020 One diminutive polyp in the ascending colon.                            Biopsied-TA                            - External hemorrhoids.                           - Anal papilla(e) were hypertrophied.   Last Endoscopy:12/2022 normal esophagus, 2 cm hiatal hernia, erythematous mucosa in antrum biopsied, normal duodenal bulb and second portion of the duodenum STOMACH, RANDOM, BIOPSY:  Gastric antral / oxyntic mucosa with focal chronic inactive gastritis.  No H. pylori identified on HE stain.  Negative for intestinal metaplasia or dysplasia.    Filed Weights   01/20/24 1442  Weight: 154 lb 8 oz (70.1 kg)     Past Medical History:  Diagnosis Date   Anemia    hx of Fe def many years ago   Anxiety    Arthritis    back   CAD (coronary artery disease)    a. s/p Coronary CT in 06/2018 showing mild stenosis along the mid-LAD, mid-LCx and mid-RCA   Cancer (HCC)    carcinoma removed on head 2024   Chronic kidney  disease    GERD (gastroesophageal reflux disease)    Hypertension    no meds in several years   PONV (postoperative nausea and vomiting)    Pre-diabetes     Past Surgical History:  Procedure Laterality Date   ABDOMINAL HYSTERECTOMY     partial hysterectomy, ovaries remain   APPLICATION OF WOUND VAC Left 08/16/2023   Procedure: APPLICATION, WOUND VAC;  Surgeon: Edna Toribio LABOR, MD;  Location: [facility];  Service: Orthopedics;  Laterality: Left;   BACK SURGERY     BIOPSY  12/29/2022   Procedure: BIOPSY;  Surgeon: Cinderella Deatrice FALCON, MD;  Location: [facility];  Service: Endoscopy;;   CATARACT EXTRACTION W/PHACO Right 01/15/2023   Procedure: CATARACT EXTRACTION PHACO AND INTRAOCULAR LENS PLACEMENT (IOC);  Surgeon: Harrie Agent, MD;  Location: [facility];  Service: Ophthalmology;  Laterality: Right;  CDE 6.32   CATARACT EXTRACTION W/PHACO Left 01/29/2023   Procedure: CATARACT EXTRACTION PHACO AND INTRAOCULAR LENS PLACEMENT (IOC);  Surgeon:  Harrie Agent, MD;  Location: [facility];  Service: Ophthalmology;  Laterality: Left;  CDE: 7.39   CHOLECYSTECTOMY N/A 11/27/2019   Procedure: LAPAROSCOPIC CHOLECYSTECTOMY;  Surgeon: Kallie Manuelita BROCKS, MD;  Location: [facility];  Service: General;  Laterality: N/A;   COLONOSCOPY N/A 02/29/2020   Procedure: COLONOSCOPY;  Surgeon: Golda Claudis PENNER, MD;  [facility]: [facility];  Service: Endoscopy;  Laterality: N/A;  8:45   ESOPHAGEAL DILATION N/A 11/17/2017   Procedure: ESOPHAGEAL DILATION;  Surgeon: Golda Claudis PENNER, MD;  [facility]: [facility];  Service: Endoscopy;  Laterality: N/A;   ESOPHAGOGASTRODUODENOSCOPY N/A 11/17/2017   Procedure: ESOPHAGOGASTRODUODENOSCOPY (EGD);  Surgeon: Golda Claudis PENNER, MD;  [facility]: [facility];  Service: Endoscopy;  Laterality: N/A;  12:00   ESOPHAGOGASTRODUODENOSCOPY N/A 11/02/2019   Procedure: ESOPHAGOGASTRODUODENOSCOPY (EGD);  Surgeon: Golda Claudis PENNER, MD;  [facility]: [facility];  Service: Endoscopy;  Laterality: N/A;  945   ESOPHAGOGASTRODUODENOSCOPY (EGD) WITH PROPOFOL  N/A 12/29/2022   Procedure: ESOPHAGOGASTRODUODENOSCOPY (EGD) WITH PROPOFOL ;  Surgeon: Cinderella Deatrice FALCON, MD;  [facility]: [facility];  Service: Endoscopy;  Laterality: N/A;  11:15AM;ASA 3   FOOT SURGERY     POLYPECTOMY  02/29/2020   Procedure: POLYPECTOMY;  Surgeon: Golda Claudis PENNER, MD;  [facility]: [facility];  Service: Endoscopy;;   REVISION TOTAL HIP ARTHROPLASTY Left    TOTAL HIP ARTHROPLASTY WITH HARDWARE REMOVAL Left 08/16/2023   Procedure: REVISION, ARTHROPLASTY, HIP;  Surgeon: Edna Toribio LABOR, MD;  Location: [facility];  Service: Orthopedics;  Laterality: Left;    Current Outpatient Medications  Medication Sig Dispense Refill   aspirin  EC 81 MG tablet Take 81 mg by mouth daily. Swallow whole.     Cholecalciferol (VITAMIN D3) 50 MCG (2000 UT) capsule Take 2,000 Units by mouth daily.     dexlansoprazole  (DEXILANT ) 60 MG capsule TAKE (1) CAPSULE BY MOUTH ONCE DAILY. 30 capsule 11    fluticasone  (FLONASE ) 50 MCG/ACT nasal spray Place 2 sprays into both nostrils 2 (two) times daily. (Patient taking differently: Place 2 sprays into both nostrils 2 (two) times daily. As needed)     LORazepam (ATIVAN) 1 MG tablet Take 1 mg by mouth at bedtime.     metoCLOPramide  (REGLAN ) 10 MG tablet Take 1 tablet (10 mg total) by mouth every 8 (eight) hours as needed for nausea. 30 tablet 1   Multiple Vitamin (MULTIVITAMIN) tablet Take 1 tablet by mouth daily. One a Day     rosuvastatin  (CRESTOR ) 20 MG tablet Take 1 tablet 3 times  per week ( M,W,F) 90 tablet 3   Sertraline HCl (ZOLOFT PO) Take 25 mg by mouth daily.     tacrolimus (PROTOPIC) 0.1 % ointment Apply 0.1 g topically 2 (two) times daily.     augmented betamethasone  dipropionate (DIPROLENE -AF) 0.05 % cream Apply topically 2 (two) times daily as needed.     ciclopirox (LOPROX) 0.77 % cream Apply 1 Application topically 2 (two) times daily. Toe (Patient not taking: Reported on 10/07/2023)     CINNAMON PO Take 120 mg by mouth daily. Apple cider vinegar     clobetasol ointment (TEMOVATE) 0.05 % SMARTSIG:1 sparingly Topical Every Evening     Evolocumab  (REPATHA  SURECLICK) 140 MG/ML SOAJ Inject 140 mg into the skin every 14 (fourteen) days. 6 mL 3   guaiFENesin  (MUCINEX  PO) Take 1,200 mg by mouth daily as needed (Congestion). (Patient not taking: Reported on 10/07/2023)     Levocetirizine Dihydrochloride  (XYZAL  PO) Take by mouth.     Polyethylene Glycol 3350  (MIRALAX  PO) Take 1 Capful by mouth daily as needed (Constipation). (Patient not taking: Reported on 01/20/2024)     predniSONE (DELTASONE) 10 MG tablet Take by mouth.     terbinafine  (LAMISIL ) 250 MG tablet Take 1 tablet (250 mg total) by mouth daily. (Patient not taking: Reported on 10/07/2023) 30 tablet 0   urea  (CARMOL) 40 % CREA Apply 1 application  topically 2 (two) times daily. Toes (Patient not taking: Reported on 10/07/2023)     No current facility-administered medications for this  visit.    Allergies as of 01/20/2024 - Review Complete 01/20/2024  Allergen Reaction Noted   Codeine Other (See Comments) 05/27/2010   Phenazopyridine hcl Other (See Comments) 09/04/2021   Demerol  Rash 05/27/2010   Elemental sulfur  Rash 05/27/2010   Feldene [piroxicam] Rash 05/27/2010   Latex Rash 05/27/2010    Social History   Socioeconomic History   Marital status: Widowed    Spouse name: Not on file   Number of children: Not on file   Years of education: Not on file   Highest education level: Not on file  Occupational History   Not on file  Tobacco Use   Smoking status: Never    Passive exposure: Past   Smokeless tobacco: Never  Vaping Use   Vaping status: Never Used  Substance and Sexual Activity   Alcohol  use: No   Drug use: No   Sexual activity: Never  Other Topics Concern   Not on file  Social History Narrative   Not on file   Social Drivers of Health   Tobacco Use: Low Risk (10/07/2023)   Patient History    Smoking Tobacco Use: Never    Smokeless Tobacco Use: Never    Passive Exposure: Past  Financial Resource Strain: Not on file  Food Insecurity: No Food Insecurity (08/16/2023)   Epic    Worried About Programme Researcher, Broadcasting/film/video in the Last Year: Never true    Ran Out of Food in the Last Year: Never true  Transportation Needs: No Transportation Needs (08/16/2023)   Epic    Lack of Transportation (Medical): No    Lack of Transportation (Non-Medical): No  Physical Activity: Not on file  Stress: Not on file  Social Connections: Moderately Integrated (08/16/2023)   Social Connection and Isolation Panel    Frequency of Communication with Friends and Family: More than three times a week    Frequency of Social Gatherings with Friends and Family: More than three times a week  Attends Religious Services: More than 4 times per year    Active Member of Clubs or Organizations: Yes    Attends Banker Meetings: More than 4 times per year    Marital Status:  Widowed  Depression (PHQ2-9): Not on file  Alcohol  Screen: Not on file  Housing: Low Risk (08/16/2023)   Epic    Unable to Pay for Housing in the Last Year: No    Number of Times Moved in the Last Year: 0    Homeless in the Last Year: No  Utilities: Not At Risk (08/16/2023)   Epic    Threatened with loss of utilities: No  Health Literacy: Not on file    Review of systems General: negative for malaise, night sweats, fever, chills, weight loss Neck: Negative for lumps, goiter, pain and significant neck swelling Resp: Negative for cough, wheezing, dyspnea at rest CV: Negative for chest pain, leg swelling, palpitations, orthopnea GI: denies melena, hematochezia, nausea, vomiting, diarrhea, constipation, dysphagia, odyonophagia, early satiety or unintentional weight loss.  MSK: Negative for joint pain or swelling, back pain, and muscle pain. Derm: Negative for itching or rash Psych: Denies depression, anxiety, memory loss, confusion. No homicidal or suicidal ideation.  Heme: Negative for prolonged bleeding, bruising easily, and swollen nodes. Endocrine: Negative for cold or heat intolerance, polyuria, polydipsia and goiter. Neuro: negative for tremor, gait imbalance, syncope and seizures. The remainder of the review of systems is noncontributory.  Physical Exam: BP 114/64   Pulse 75   Temp (!) 97.1 F (36.2 C) (Rectal)   Ht 5' 6.5 (1.689 m)   Wt 154 lb 8 oz (70.1 kg)   BMI 24.56 kg/m  General:   Alert and oriented. No distress noted. Pleasant and cooperative.  Head:  Normocephalic and atraumatic. Eyes:  Conjuctiva clear without scleral icterus. Mouth:  Oral mucosa pink and moist. Good dentition. No lesions. Heart: Normal rate and rhythm, s1 and s2 heart sounds present.  Lungs: Clear lung sounds in all lobes. Respirations equal and unlabored. Abdomen:  +BS, soft, non-tender and non-distended. No rebound or guarding. No HSM or masses noted. Derm: No palmar erythema or jaundice Msk:   Symmetrical without gross deformities. Normal posture. Extremities:  Without edema. Neurologic:  Alert and  oriented x4 Psych:  Alert and cooperative. Normal mood and affect.  Invalid input(s): 6 MONTHS   ASSESSMENT: Laurie Bridges is a 79 y.o. female presenting today    PLAN:  -iron panel, B12/folate -miralax  every day to every other day and take metamucil BID -continue reglan  PRN -continue dexilant  60mg  daily   All questions were answered, patient verbalized understanding and is in agreement with plan as outlined above.   Follow Up: ***  Euel Castile L. Briza Bark, MSN, APRN, AGNP-C Adult-Gerontology Nurse Practitioner [facility]  "

## 2024-01-20 NOTE — Patient Instructions (Signed)
-  iron panel, B12/folate -miralax  every day to every other day and take metamucil twice daily -Increase water  intake, aim for atleast 64 oz per day Increase fruits, veggies and whole grains, kiwi and prunes are especially good for constipation -continue reglan  as needed for nausea -discuss food allergy testing with PCP -continue dexilant  60mg  daily   We will plan to follow up 1 year, however, if you have new or worsening GI issues, please let me know and we will be happy to see you sooner  It was a pleasure to see you today. I want to create trusting relationships with patients and provide genuine, compassionate, and quality care. I truly value your feedback! please be on the lookout for a survey regarding your visit with me today. I appreciate your input about our visit and your time in completing this!    Shayne Deerman L. Kaspar Albornoz, MSN, APRN, AGNP-C Adult-Gerontology Nurse Practitioner [facility]

## 2024-01-21 LAB — IRON,TIBC AND FERRITIN PANEL
%SAT: 20 % (ref 16–45)
Ferritin: 13 ng/mL — ABNORMAL LOW (ref 16–288)
Iron: 65 ug/dL (ref 45–160)
TIBC: 324 ug/dL (ref 250–450)

## 2024-01-21 LAB — B12 AND FOLATE PANEL
Folate: >24 ng/mL
Vitamin B-12: 1036 pg/mL (ref 200–1100)

## 2024-01-26 ENCOUNTER — Ambulatory Visit (INDEPENDENT_AMBULATORY_CARE_PROVIDER_SITE_OTHER): Payer: Self-pay | Admitting: Gastroenterology

## 2024-02-24 ENCOUNTER — Ambulatory Visit: Admitting: Internal Medicine

## 2024-04-20 ENCOUNTER — Ambulatory Visit: Admitting: Internal Medicine
